# Patient Record
Sex: Female | Born: 1990 | Race: Black or African American | Hispanic: No | Marital: Single | Smoking: Never smoker
Health system: Southern US, Community
[De-identification: ages and names within clinical notes are randomized; demographics above are authoritative.]

## PROBLEM LIST (undated history)

## (undated) ENCOUNTER — Emergency Department (HOSPITAL_COMMUNITY)
Disposition: A | Discharge status: Home / Self Care | Payer: Medicaid Other | Attending: Emergency Medicine | Admitting: Emergency Medicine

## (undated) DIAGNOSIS — Z8613 Personal history of malaria: Secondary | ICD-10-CM

## (undated) DIAGNOSIS — Z8632 Personal history of gestational diabetes: Secondary | ICD-10-CM

## (undated) DIAGNOSIS — H7290 Unspecified perforation of tympanic membrane, unspecified ear: Secondary | ICD-10-CM

## (undated) DIAGNOSIS — D649 Anemia, unspecified: Secondary | ICD-10-CM

## (undated) DIAGNOSIS — Z8719 Personal history of other diseases of the digestive system: Secondary | ICD-10-CM

## (undated) DIAGNOSIS — O26899 Other specified pregnancy related conditions, unspecified trimester: Secondary | ICD-10-CM

## (undated) DIAGNOSIS — Z6791 Unspecified blood type, Rh negative: Secondary | ICD-10-CM

## (undated) HISTORY — PX: EXTERNAL EAR SURGERY: SHX627

---
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
## 2010-01-29 DIAGNOSIS — Z8613 Personal history of malaria: Secondary | ICD-10-CM

## 2010-01-29 HISTORY — DX: Personal history of malaria: Z86.13

## 2011-04-03 ENCOUNTER — Emergency Department (INDEPENDENT_AMBULATORY_CARE_PROVIDER_SITE_OTHER)
Admission: EM | Admit: 2011-04-03 | Discharge: 2011-04-03 | Disposition: A | Discharge status: Home / Self Care | Payer: Self-pay | Source: Home / Self Care | Attending: Family Medicine | Admitting: Family Medicine

## 2011-04-03 ENCOUNTER — Encounter (HOSPITAL_COMMUNITY): Payer: Self-pay | Admitting: Emergency Medicine

## 2011-04-03 DIAGNOSIS — R1011 Right upper quadrant pain: Secondary | ICD-10-CM

## 2011-04-03 DIAGNOSIS — G8929 Other chronic pain: Secondary | ICD-10-CM

## 2011-04-03 MED ORDER — RANITIDINE HCL 150 MG PO TABS: 150.0000 mg | ORAL_TABLET | Freq: Two times a day (BID) | ORAL | Status: DC

## 2011-04-03 MED ORDER — GI COCKTAIL ~~LOC~~
ORAL | Status: AC
  Filled 2011-04-03: qty 30

## 2011-04-03 MED ORDER — GI COCKTAIL ~~LOC~~
30.0000 mL | Freq: Once | ORAL | Status: AC
  Administered 2011-04-03: 30 mL via ORAL

## 2011-04-03 NOTE — ED Provider Notes (Signed)
History     CSN: [id number]  Arrival date & time 04/03/11  8413   First MD Initiated Contact with Patient 04/03/11 0932      Chief Complaint  Patient presents with  . Abdominal Pain    (Consider location/radiation/quality/duration/timing/severity/associated sxs/prior treatment) Patient is a 21 y.o. female presenting with abdominal pain. The history is provided by the patient.  Abdominal Pain The primary symptoms of the illness include abdominal pain. The primary symptoms of the illness do not include fever, nausea, vomiting, diarrhea or hematochezia. The current episode started more than 2 days ago (over 1 yr sx , is a refugee from [place], in [place] for 2 weeks, sx constant since, has seen md before and told it was gastritis and given meds which helped.). The onset of the illness was gradual. The problem has been gradually worsening.    History reviewed. No pertinent past medical history.  History reviewed. No pertinent past surgical history.  No family history on file.  History  Substance Use Topics  . Smoking status: Never Smoker   . Smokeless tobacco: Not on file  . Alcohol Use: No    OB History    Grav Para Term Preterm Abortions TAB SAB Ect Mult Living                  Review of Systems  Constitutional: Negative for fever.  HENT: Negative.   Respiratory: Negative.   Cardiovascular: Negative.   Gastrointestinal: Positive for abdominal pain. Negative for nausea, vomiting, diarrhea and hematochezia.  Genitourinary: Negative.     Allergies  Review of patient's allergies indicates no known allergies.  Home Medications   Current Outpatient Rx  Name Route Sig Dispense Refill  . RANITIDINE HCL 150 MG PO TABS Oral Take 1 tablet (150 mg total) by mouth 2 (two) times daily. 60 tablet 0    BP 100/67  Pulse 70  Temp(Src) 97.7 F (36.5 C) (Oral)  Resp 18  SpO2 100%  LMP 03/18/2011  Physical Exam  Nursing note and vitals reviewed. Constitutional: She appears  well-developed and well-nourished.  HENT:  Head: Normocephalic.  Eyes: Pupils are equal, round, and reactive to light.  Neck: Normal range of motion. Neck supple.  Abdominal: Soft. Bowel sounds are normal. There is tenderness in the epigastric area. There is no rigidity, no rebound, no guarding and no CVA tenderness.    ED Course  Procedures (including critical care time)  Labs Reviewed - No data to display No results found.   1. Chronic bilateral upper abdominal pain       MDM          Barkley Bruns, MD 04/03/11 1026

## 2011-04-03 NOTE — ED Notes (Signed)
C/o abdominal pain, hurts every day, pain for one year.  Has seen a physician before for these symptoms.  Vomiting when she has eaten a lot of food, no blood in vomitus.  [place]  Patient is from [place]. Pain may last 7-6 days, reports had medicine back home, but [place].  Denies stress.

## 2011-04-30 ENCOUNTER — Encounter (HOSPITAL_COMMUNITY): Payer: Self-pay | Admitting: *Deleted

## 2011-04-30 ENCOUNTER — Emergency Department (HOSPITAL_COMMUNITY)
Admission: EM | Admit: 2011-04-30 | Discharge: 2011-04-30 | Disposition: A | Discharge status: Home / Self Care | Payer: Medicaid Other | Attending: Emergency Medicine | Admitting: Emergency Medicine

## 2011-04-30 DIAGNOSIS — R42 Dizziness and giddiness: Secondary | ICD-10-CM | POA: Insufficient documentation

## 2011-04-30 DIAGNOSIS — Z79899 Other long term (current) drug therapy: Secondary | ICD-10-CM | POA: Insufficient documentation

## 2011-04-30 DIAGNOSIS — R55 Syncope and collapse: Secondary | ICD-10-CM | POA: Insufficient documentation

## 2011-04-30 DIAGNOSIS — R5381 Other malaise: Secondary | ICD-10-CM | POA: Insufficient documentation

## 2011-04-30 DIAGNOSIS — R5383 Other fatigue: Secondary | ICD-10-CM | POA: Insufficient documentation

## 2011-04-30 DIAGNOSIS — R531 Weakness: Secondary | ICD-10-CM

## 2011-04-30 LAB — POCT I-STAT, CHEM 8
Chloride: 108 mEq/L (ref 96–112)
HCT: 41 % (ref 36.0–46.0)
Hemoglobin: 13.9 g/dL (ref 12.0–15.0)
Potassium: 4 mEq/L (ref 3.5–5.1)
Sodium: 139 mEq/L (ref 135–145)

## 2011-04-30 LAB — PREGNANCY, URINE: Preg Test, Ur: NEGATIVE

## 2011-04-30 NOTE — ED Provider Notes (Signed)
History     CSN: [id number]  Arrival date & time 04/30/11  1449   First MD Initiated Contact with Patient 04/30/11 1809      Chief Complaint  Patient presents with  . Near Syncope    after blood being drawn     (Consider location/radiation/quality/duration/timing/severity/associated sxs/prior treatment) HPI Comments: Patient reports generalized weakness and fatigue for the past couple of weeks.  She feels that she may be pregnant.  Her PCP did a serum pregnancy test today, but patient states that she does not know the results.  Patient denies any nausea or vomiting.  Denies vaginal discharge, vaginal bleeding, or abdominal pain.  She reports that her LMP was 03/18/11, which was a normal period.  She reports that her menstrual cycle is typically around one month.  She also reports that she was feeling lightheaded today after having her blood drawn.  She reports that she has not had anything to eat since early this morning.  No syncope.  The history is provided by the patient. The history is limited by a language barrier. A language interpreter was used Furniture conservator/restorer).    History reviewed. No pertinent past medical history.  History reviewed. No pertinent past surgical history.  No family history on file.  History  Substance Use Topics  . Smoking status: Never Smoker   . Smokeless tobacco: Not on file  . Alcohol Use: No    OB History    Grav Para Term Preterm Abortions TAB SAB Ect Mult Living                  Review of Systems  Constitutional: Positive for appetite change. Negative for fever and chills.  HENT: Negative for neck pain and neck stiffness.   Gastrointestinal: Negative for nausea, vomiting, abdominal pain and diarrhea.  Genitourinary: Negative for dysuria, frequency, decreased urine volume, vaginal bleeding, vaginal discharge and pelvic pain.  Skin: Negative for rash.  Neurological: Positive for dizziness and weakness. Negative for tremors, syncope, numbness  and headaches.    Allergies  Review of patient's allergies indicates no known allergies.  Home Medications   Current Outpatient Rx  Name Route Sig Dispense Refill  . RANITIDINE HCL 150 MG PO TABS Oral Take 1 tablet (150 mg total) by mouth 2 (two) times daily. 60 tablet 0    BP 110/65  Pulse 65  Temp(Src) 98.6 F (37 C) (Oral)  Resp 16  SpO2 100%  LMP 03/18/2011  Physical Exam  Nursing note and vitals reviewed. Constitutional: She appears well-developed and well-nourished. No distress.  HENT:  Head: Normocephalic and atraumatic.  Mouth/Throat: Oropharynx is clear and moist.  Eyes: EOM are normal. Pupils are equal, round, and reactive to light.  Neck: Normal range of motion. Neck supple.  Cardiovascular: Normal rate, regular rhythm and normal heart sounds.   Pulmonary/Chest: Effort normal and breath sounds normal.  Abdominal: Soft. Bowel sounds are normal. She exhibits no distension and no mass. There is no rebound and no guarding.  Neurological: She is alert. She has normal strength and normal reflexes. No cranial nerve deficit or sensory deficit. She displays a negative Romberg sign. Coordination and gait normal.  Skin: Skin is warm and dry. No rash noted. She is not diaphoretic.  Psychiatric: She has a normal mood and affect.    ED Course  Procedures (including critical care time)   Labs Reviewed  PREGNANCY, URINE   No results found.   No diagnosis found.    MDM  Patient  with generalized weakness and fatigue.  Patient concerned that she may be pregnant.  Urine pregnancy negative.  Patient denies abdominal pain, vaginal bleeding, or vaginal discharge.  I stat 8 showed a normal Hgb and Hct and was completely normal.  Patient instructed to follow up with PCP.       Pascal Lux [place], PA-C 05/02/11 0101

## 2011-04-30 NOTE — ED Notes (Signed)
Pt. Also denies painful or frequent urination.

## 2011-04-30 NOTE — ED Notes (Signed)
Pt. C/o nausea and vomiting on and off for about a week.  Denies diarrhea but says she has felt aches for about a week also.  Pt.'s last menses was Feb. 17th.

## 2011-04-30 NOTE — ED Notes (Signed)
Pt states through [facility] interpreter for [place] "I was getting blood drawn to check for pregnancy and felt dizzy, haven't had a period in 2 months, was nauseated"

## 2011-05-01 ENCOUNTER — Ambulatory Visit (INDEPENDENT_AMBULATORY_CARE_PROVIDER_SITE_OTHER): Payer: Medicaid Other | Admitting: Family Medicine

## 2011-05-01 ENCOUNTER — Encounter: Payer: Self-pay | Admitting: Family Medicine

## 2011-05-01 DIAGNOSIS — Z3009 Encounter for other general counseling and advice on contraception: Secondary | ICD-10-CM

## 2011-05-01 DIAGNOSIS — H9190 Unspecified hearing loss, unspecified ear: Secondary | ICD-10-CM

## 2011-05-01 MED ORDER — PRENATAL VITAMINS (DIS) PO TABS: 1.0000 | ORAL_TABLET | Freq: Every day | ORAL | Status: DC

## 2011-05-01 NOTE — Progress Notes (Signed)
  Subjective:    Patient ID: Heather Savage, female    DOB: February 27, 1990, 21 y.o.   MRN: [id number]  HPI  New immigrant here to establish primary care  Hearing loss:  Since age 66 she associated this to a head trauma.  Brings foreign health records: chronic otitis media.  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  Were planning tympanostomy and hearing aids but was not available in [place]- recommended immediate surgical treatment in 09/2009.  Notes chronic tinnitus, bilateral ear drainage.    LMP 03/13/11 Review of Systems Patient Information Form: Screening and ROS  AUDIT-C Score: 0 Do you feel safe in relationships? yes PHQ-2:negative  Review of Symptoms  General:  Negative for nexplained weight loss, fever Skin: Negative for new or changing mole, sore that won't heal HEENT: POSITIVE for trouble hearing  ringing in ears, dizziness NEGATIVE for mouth sores, hoarseness, change in voice, dysphagia. CV:  Negative for chest pain, dyspnea, edema, palpitations Resp: Negative for cough, dyspnea, hemoptysis GI: Negative for nausea, vomiting, diarrhea, constipation, abdominal pain, melena, hematochezia. GU: Negative for dysuria, incontinence, urinary hesitance, hematuria, vaginal or penile discharge, polyuria, sexual difficulty, lumps in testicle or breasts MSK: Negative for muscle cramps or aches, joint pain or swelling Neuro: Negative for headaches, weakness, numbness, dizziness, passing out/fainting Psych: Negative for depression, anxiety, memory problems  Boyfriend serves as interpreter.  They decline need for 3rd party phone interpreter and have signed paperwork.    Objective:   Physical Exam GEN: Alert & Oriented, No acute distress HEENT: [place]/AT- well healed scar midline upper forehead. EOMI, PERRLA, no conjunctival injection or scleral icterus.  Bilateral tympanic membranes NOT intact. No pus or erythema.  Oropharynx is without erythema or exudates.  No anterior or  posterior cervical lymphadenopathy. CV:  Regular Rate & Rhythm, no murmur Respiratory:  Normal work of breathing, CTAB Abd:  + BS, soft, no tenderness to palpation Ext: no pre-tibial edema Psych:  Alert, oriented, normal affect, judgement good.       Assessment & Plan:

## 2011-05-01 NOTE — Patient Instructions (Signed)
Take prenatal vitamin every day  Make appointment in May for women's yearly checkup  I wlil make appointment for ear doctor.  If you don't hear from [place], give [place] a call to check on appointment

## 2011-05-01 NOTE — Assessment & Plan Note (Signed)
Would like to become pregnant soon.  I advised and prescribed prenatal vitamins.  Will follow-up next month for annual gynecological exam.

## 2011-05-02 NOTE — ED Provider Notes (Signed)
Medical screening examination/treatment/procedure(s) were performed by non-physician practitioner and as supervising physician I was immediately available for consultation/collaboration.  Hurman Horn, MD 05/02/11 2156

## 2011-05-10 ENCOUNTER — Encounter (HOSPITAL_BASED_OUTPATIENT_CLINIC_OR_DEPARTMENT_OTHER): Payer: Self-pay | Admitting: *Deleted

## 2011-05-10 ENCOUNTER — Encounter: Payer: Self-pay | Admitting: Family Medicine

## 2011-05-13 NOTE — H&P (Signed)
  Heather Savage is an 21 y.o. female.   Chief Complaint: Hearing loss HPI: African immigrant, history of bilateral hearing loss for many years.   Past Medical History  Diagnosis Date  . Hearing loss     bilat.  Marland Kitchen Headache     due to left ear problem  . GERD (gastroesophageal reflux disease)     occasional - Zantac as needed  . Arthritis   . 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    History reviewed. No pertinent past surgical history.  History reviewed. No pertinent family history. Social History:  reports that she has never smoked. She has never used smokeless tobacco. She reports that she does not drink alcohol or use illicit drugs.  Allergies: No Known Allergies  No current facility-administered medications on file as of .   Medications Prior to Admission  Medication Sig Dispense Refill  . aspirin-acetaminophen-caffeine (EXCEDRIN MIGRAINE) 250-250-65 MG per tablet Take 1 tablet by mouth every 6 (six) hours as needed.      . ranitidine (ZANTAC) 150 MG tablet Take 1 tablet (150 mg total) by mouth 2 (two) times daily.  60 tablet  0    No results found for this or any previous visit (from the past 48 hour(s)). No results found.  ROS: otherwise negative  Height 5' (1.524 m), weight 110 lb 3.7 oz (50 kg), last menstrual period 05/02/2011.  PHYSICAL EXAM: Overall appearance:  Healthy appearing, in no distress Head:  Normocephalic, atraumatic. Ears: External auditory canals are clear; tympanic membranes with bilateral near-total perforations.. Nose: External nose is healthy in appearance. Internal nasal exam free of any lesions or obstruction. Oral Cavity:  There are no mucosal lesions or masses identified. Oral Pharynx/Hypopharynx/Larynx: no signs of any mucosal lesions or masses identified.  Neuro:  No identifiable neurologic deficits. Neck: No palpable neck masses.  Studies Reviewed: Audiograms reveal  severe bilateral conductive hearing loss. Left ear is slightly worse.    Assessment/Plan Recommend left tympanoplasty.  Heather Savage 05/13/2011, 3:06 PM

## 2011-05-14 ENCOUNTER — Encounter (HOSPITAL_BASED_OUTPATIENT_CLINIC_OR_DEPARTMENT_OTHER): Payer: Self-pay | Admitting: Anesthesiology

## 2011-05-14 ENCOUNTER — Encounter (HOSPITAL_BASED_OUTPATIENT_CLINIC_OR_DEPARTMENT_OTHER): Payer: Self-pay | Admitting: *Deleted

## 2011-05-14 ENCOUNTER — Ambulatory Visit (HOSPITAL_BASED_OUTPATIENT_CLINIC_OR_DEPARTMENT_OTHER)
Admission: RE | Admit: 2011-05-14 | Discharge: 2011-05-14 | Disposition: A | Discharge status: Home / Self Care | Payer: Medicaid Other | Source: Ambulatory Visit | Attending: Otolaryngology | Admitting: Otolaryngology

## 2011-05-14 ENCOUNTER — Encounter (HOSPITAL_BASED_OUTPATIENT_CLINIC_OR_DEPARTMENT_OTHER)
Admission: RE | Disposition: A | Discharge status: Home / Self Care | Payer: Self-pay | Source: Ambulatory Visit | Attending: Otolaryngology

## 2011-05-14 ENCOUNTER — Ambulatory Visit (HOSPITAL_BASED_OUTPATIENT_CLINIC_OR_DEPARTMENT_OTHER): Payer: Medicaid Other | Admitting: Anesthesiology

## 2011-05-14 DIAGNOSIS — H9190 Unspecified hearing loss, unspecified ear: Secondary | ICD-10-CM | POA: Insufficient documentation

## 2011-05-14 DIAGNOSIS — H7290 Unspecified perforation of tympanic membrane, unspecified ear: Secondary | ICD-10-CM | POA: Insufficient documentation

## 2011-05-14 DIAGNOSIS — K219 Gastro-esophageal reflux disease without esophagitis: Secondary | ICD-10-CM | POA: Insufficient documentation

## 2011-05-14 DIAGNOSIS — R51 Headache: Secondary | ICD-10-CM | POA: Insufficient documentation

## 2011-05-14 DIAGNOSIS — M129 Arthropathy, unspecified: Secondary | ICD-10-CM | POA: Insufficient documentation

## 2011-05-14 HISTORY — DX: Unspecified perforation of tympanic membrane, unspecified ear: H72.90

## 2011-05-14 HISTORY — PX: TYMPANOPLASTY: SHX33

## 2011-05-14 LAB — POCT HEMOGLOBIN-HEMACUE: Hemoglobin: 11.4 g/dL — ABNORMAL LOW (ref 12.0–15.0)

## 2011-05-14 SURGERY — TYMPANOPLASTY
Anesthesia: General | Site: Ear | Laterality: Left | Wound class: Clean Contaminated

## 2011-05-14 MED ORDER — NEOSTIGMINE METHYLSULFATE 1 MG/ML IJ SOLN
INTRAMUSCULAR | Status: DC | PRN
  Administered 2011-05-14: 1.5 mg via INTRAVENOUS

## 2011-05-14 MED ORDER — MIDAZOLAM HCL 2 MG/2ML IJ SOLN: 0.5000 mg | INTRAMUSCULAR | Status: DC | PRN

## 2011-05-14 MED ORDER — METOCLOPRAMIDE HCL 5 MG/ML IJ SOLN: 10.0000 mg | Freq: Once | INTRAMUSCULAR | Status: DC | PRN

## 2011-05-14 MED ORDER — METOCLOPRAMIDE HCL 5 MG/ML IJ SOLN
INTRAMUSCULAR | Status: DC | PRN
  Administered 2011-05-14: 10 mg via INTRAVENOUS

## 2011-05-14 MED ORDER — ROCURONIUM BROMIDE 100 MG/10ML IV SOLN
INTRAVENOUS | Status: DC | PRN
  Administered 2011-05-14: 30 mg via INTRAVENOUS

## 2011-05-14 MED ORDER — PROPOFOL 10 MG/ML IV EMUL
INTRAVENOUS | Status: DC | PRN
  Administered 2011-05-14: 200 mg via INTRAVENOUS

## 2011-05-14 MED ORDER — LIDOCAINE-EPINEPHRINE 1 %-1:100000 IJ SOLN
INTRAMUSCULAR | Status: DC | PRN
  Administered 2011-05-14: 5 mL

## 2011-05-14 MED ORDER — METHYLENE BLUE 1 % INJ SOLN
INTRAMUSCULAR | Status: DC | PRN
  Administered 2011-05-14: .001 mL via SUBMUCOSAL

## 2011-05-14 MED ORDER — EPINEPHRINE HCL 1 MG/ML IJ SOLN
INTRAMUSCULAR | Status: DC | PRN
  Administered 2011-05-14: .001 mL

## 2011-05-14 MED ORDER — PROMETHAZINE HCL 25 MG RE SUPP: 25.0000 mg | Freq: Four times a day (QID) | RECTAL | Status: DC | PRN

## 2011-05-14 MED ORDER — LACTATED RINGERS IV SOLN
INTRAVENOUS | Status: DC
  Administered 2011-05-14 (×3): via INTRAVENOUS

## 2011-05-14 MED ORDER — HYDROMORPHONE HCL PF 1 MG/ML IJ SOLN: 0.2500 mg | INTRAMUSCULAR | Status: DC | PRN

## 2011-05-14 MED ORDER — KETOROLAC TROMETHAMINE 30 MG/ML IJ SOLN
INTRAMUSCULAR | Status: DC | PRN
  Administered 2011-05-14: 30 mg via INTRAVENOUS

## 2011-05-14 MED ORDER — BACITRACIN ZINC 500 UNIT/GM EX OINT
TOPICAL_OINTMENT | CUTANEOUS | Status: DC | PRN
  Administered 2011-05-14: 1 via TOPICAL

## 2011-05-14 MED ORDER — CIPROFLOXACIN-DEXAMETHASONE 0.3-0.1 % OT SUSP
OTIC | Status: DC | PRN
  Administered 2011-05-14: 8 [drp] via OTIC

## 2011-05-14 MED ORDER — ACETAMINOPHEN 10 MG/ML IV SOLN
1000.0000 mg | Freq: Once | INTRAVENOUS | Status: AC
  Administered 2011-05-14: 1000 mg via INTRAVENOUS

## 2011-05-14 MED ORDER — DEXAMETHASONE SODIUM PHOSPHATE 4 MG/ML IJ SOLN
INTRAMUSCULAR | Status: DC | PRN
  Administered 2011-05-14: 10 mg via INTRAVENOUS

## 2011-05-14 MED ORDER — CIPROFLOXACIN-DEXAMETHASONE 0.3-0.1 % OT SUSP: 3.0000 [drp] | Freq: Three times a day (TID) | OTIC | Status: AC

## 2011-05-14 MED ORDER — GLYCOPYRROLATE 0.2 MG/ML IJ SOLN
INTRAMUSCULAR | Status: DC | PRN
  Administered 2011-05-14: 0.3 mg via INTRAVENOUS

## 2011-05-14 MED ORDER — ONDANSETRON HCL 4 MG/2ML IJ SOLN
INTRAMUSCULAR | Status: DC | PRN
  Administered 2011-05-14: 4 mg via INTRAVENOUS

## 2011-05-14 MED ORDER — MIDAZOLAM HCL 5 MG/5ML IJ SOLN
INTRAMUSCULAR | Status: DC | PRN
  Administered 2011-05-14: 2 mg via INTRAVENOUS

## 2011-05-14 MED ORDER — HYDROCODONE-ACETAMINOPHEN 7.5-500 MG PO TABS: 1.0000 | ORAL_TABLET | Freq: Four times a day (QID) | ORAL | Status: AC | PRN

## 2011-05-14 MED ORDER — DROPERIDOL 2.5 MG/ML IJ SOLN
INTRAMUSCULAR | Status: DC | PRN
  Administered 2011-05-14: 0.625 mg via INTRAVENOUS

## 2011-05-14 MED ORDER — FENTANYL CITRATE 0.05 MG/ML IJ SOLN
INTRAMUSCULAR | Status: DC | PRN
  Administered 2011-05-14: 25 ug via INTRAVENOUS
  Administered 2011-05-14: 50 ug via INTRAVENOUS
  Administered 2011-05-14: 25 ug via INTRAVENOUS

## 2011-05-14 SURGICAL SUPPLY — 77 items
BANDAGE GAUZE 4  KLING STR (GAUZE/BANDAGES/DRESSINGS) IMPLANT
BANDAGE GAUZE ELAST BULKY 4 IN (GAUZE/BANDAGES/DRESSINGS) IMPLANT
BENZOIN TINCTURE PRP APPL 2/3 (GAUZE/BANDAGES/DRESSINGS) IMPLANT
BIT DRILL LEGEND 0.5MM 70MM (BIT) IMPLANT
BIT DRILL LEGEND 1.0MM 70MM (BIT) IMPLANT
BIT DRILL LEGEND 4.0MM 70MM (BIT) IMPLANT
BLADE NEEDLE 3 SS STRL (BLADE) IMPLANT
BLADE SURG ROTATE 9660 (MISCELLANEOUS) IMPLANT
CANISTER SUCTION 1200CC (MISCELLANEOUS) ×2 IMPLANT
CLEANER CAUTERY TIP 5X5 PAD (MISCELLANEOUS) ×1 IMPLANT
CLOTH BEACON ORANGE TIMEOUT ST (SAFETY) ×2 IMPLANT
COTTONBALL LRG STERILE PKG (GAUZE/BANDAGES/DRESSINGS) ×2 IMPLANT
DECANTER SPIKE VIAL GLASS SM (MISCELLANEOUS) ×2 IMPLANT
DERMABOND ADVANCED (GAUZE/BANDAGES/DRESSINGS) ×1
DERMABOND ADVANCED .7 DNX12 (GAUZE/BANDAGES/DRESSINGS) ×1 IMPLANT
DRAPE EENT ADH APERT 31X51 STR (DRAPES) ×2 IMPLANT
DRAPE INCISE 23X17 IOBAN STRL (DRAPES)
DRAPE INCISE IOBAN 23X17 STRL (DRAPES) IMPLANT
DRAPE MICROSCOPE URBAN (DRAPES) ×2 IMPLANT
DRAPE MICROSCOPE WILD 40.5X102 (DRAPES) IMPLANT
DRESSING TELFA 8X3 (GAUZE/BANDAGES/DRESSINGS) IMPLANT
DRILL BIT LEGEND (BIT) IMPLANT
DRILL BIT LEGEND 7BA20-MN (BIT) IMPLANT
DRILL BIT LEGEND 7BA25-MN (BIT) IMPLANT
DRILL BIT LEGEND 7BA30-MN (BIT) IMPLANT
DRILL BIT LEGEND 7BA30D-MN (BIT) IMPLANT
DRILL BIT LEGEND 7BA30DL-MN (BIT) IMPLANT
DRILL BIT LEGEND 7BA30L-MN (BIT) IMPLANT
DRILL BIT LEGEND 7BA40-MN (BIT) IMPLANT
DRILL BIT LEGEND 7BA40D-MN (BIT) IMPLANT
DRILL BIT LEGEND 7BA50-MN (BIT) IMPLANT
DRILL BIT LEGEND 7BA50D-MN (BIT) IMPLANT
DRILL BIT LEGEND 7BA60-MN (BIT) IMPLANT
DRILL BIT LEGEND 7BA70-MN (BIT) IMPLANT
DROPPER MEDICINE STER 1.5ML LF (MISCELLANEOUS) IMPLANT
DRSG GLASSCOCK MASTOID ADT (GAUZE/BANDAGES/DRESSINGS) ×2 IMPLANT
DRSG GLASSCOCK MASTOID PED (GAUZE/BANDAGES/DRESSINGS) IMPLANT
ELECT COATED BLADE 2.86 ST (ELECTRODE) ×2 IMPLANT
ELECT REM PT RETURN 9FT ADLT (ELECTROSURGICAL) ×2
ELECTRODE REM PT RTRN 9FT ADLT (ELECTROSURGICAL) ×1 IMPLANT
GAUZE SPONGE 4X4 12PLY STRL LF (GAUZE/BANDAGES/DRESSINGS) IMPLANT
GAUZE SPONGE 4X4 16PLY XRAY LF (GAUZE/BANDAGES/DRESSINGS) IMPLANT
GLOVE ECLIPSE 7.5 STRL STRAW (GLOVE) ×2 IMPLANT
GLOVE SKINSENSE NS SZ7.0 (GLOVE) ×2
GLOVE SKINSENSE STRL SZ7.0 (GLOVE) ×2 IMPLANT
GOWN PREVENTION PLUS XLARGE (GOWN DISPOSABLE) ×4 IMPLANT
GOWN PREVENTION PLUS XXLARGE (GOWN DISPOSABLE) ×2 IMPLANT
IV CATH AUTO 14GX1.75 SAFE ORG (IV SOLUTION) IMPLANT
NDL SAFETY ECLIPSE 18X1.5 (NEEDLE) ×1 IMPLANT
NEEDLE 27GAX1X1/2 (NEEDLE) ×2 IMPLANT
NEEDLE HYPO 18GX1.5 SHARP (NEEDLE) ×1
NS IRRIG 1000ML POUR BTL (IV SOLUTION) ×2 IMPLANT
PACK BASIN DAY SURGERY FS (CUSTOM PROCEDURE TRAY) ×2 IMPLANT
PACK ENT DAY SURGERY (CUSTOM PROCEDURE TRAY) ×2 IMPLANT
PAD CLEANER CAUTERY TIP 5X5 (MISCELLANEOUS) ×1
PENCIL FOOT CONTROL (ELECTRODE) ×2 IMPLANT
PORP 5.5MM GOLDENBERG LONG (Otologic Implant) ×2 IMPLANT
PORP GOLDENBERG LONG 5.5MM (Otologic Implant) ×1 IMPLANT
SET EXT MALE ROTATING LL 32IN (MISCELLANEOUS) ×2 IMPLANT
SHEET MEDIUM DRAPE 40X70 STRL (DRAPES) IMPLANT
SLEEVE SCD COMPRESS KNEE MED (MISCELLANEOUS) IMPLANT
SPONGE GAUZE 4X4 12PLY (GAUZE/BANDAGES/DRESSINGS) IMPLANT
SPONGE SURGIFOAM ABS GEL 12-7 (HEMOSTASIS) IMPLANT
STRIP CLOSURE SKIN 1/2X4 (GAUZE/BANDAGES/DRESSINGS) IMPLANT
SUT CHROMIC 3 0 PS 2 (SUTURE) IMPLANT
SUT CHROMIC 4 0 P 3 18 (SUTURE) ×2 IMPLANT
SUT CHROMIC 4 0 PS 2 18 (SUTURE) ×2 IMPLANT
SUT ETHILON 5 0 P 3 18 (SUTURE)
SUT NYLON ETHILON 5-0 P-3 1X18 (SUTURE) IMPLANT
SUT PLAIN 5 0 P 3 18 (SUTURE) IMPLANT
SUT VIC AB 3-0 FS2 27 (SUTURE) IMPLANT
SYR 5ML LL (SYRINGE) IMPLANT
SYR BULB 3OZ (MISCELLANEOUS) IMPLANT
TOWEL OR 17X24 6PK STRL BLUE (TOWEL DISPOSABLE) ×2 IMPLANT
TRAY DSU PREP LF (CUSTOM PROCEDURE TRAY) ×2 IMPLANT
TUBING IRRIGATION STER IRD100 (TUBING) IMPLANT
WATER STERILE IRR 1000ML POUR (IV SOLUTION) ×2 IMPLANT

## 2011-05-14 NOTE — Anesthesia Procedure Notes (Signed)
Procedure Name: Intubation Date/Time: 05/14/2011 9:53 AM Performed by: Gar Gibbon Pre-anesthesia Checklist: Patient identified, Emergency Drugs available, Suction available and Patient being monitored Patient Re-evaluated:Patient Re-evaluated prior to inductionOxygen Delivery Method: Circle System Utilized Preoxygenation: Pre-oxygenation with 100% oxygen Intubation Type: IV induction Ventilation: Mask ventilation without difficulty Laryngoscope Size: Mac and 3 Grade View: Grade II Tube type: Oral Rae Number of attempts: 1 Airway Equipment and Method: stylet and oral airway Placement Confirmation: ETT inserted through vocal cords under direct vision,  positive ETCO2 and breath sounds checked- equal and bilateral Secured at: 19 cm Tube secured with: Tape Dental Injury: Teeth and Oropharynx as per pre-operative assessment

## 2011-05-14 NOTE — Transfer of Care (Signed)
Immediate Anesthesia Transfer of Care Note  Patient: Heather Savage  Procedure(s) Performed: Procedure(s) (LRB): TYMPANOPLASTY (Left)  Patient Location: PACU  Anesthesia Type: General  Level of Consciousness: sedated  Airway & Oxygen Therapy: Patient Spontanous Breathing and Patient connected to face mask oxygen  Post-op Assessment: Report given to PACU RN and Post -op Vital signs reviewed and stable  Post vital signs: Reviewed and stable  Complications: No apparent anesthesia complications

## 2011-05-14 NOTE — Anesthesia Preprocedure Evaluation (Signed)
Anesthesia Evaluation  Patient identified by MRN, date of birth, ID band Patient awake    Reviewed: Allergy & Precautions, H&P , NPO status , Patient's Chart, lab work & pertinent test results, reviewed documented beta blocker date and time   Airway Mallampati: II TM Distance: >3 FB Neck ROM: full    Dental   Pulmonary neg pulmonary ROS,          Cardiovascular negative cardio ROS      Neuro/Psych  Headaches, negative psych ROS   GI/Hepatic Neg liver ROS, Medicated and Controlled,  Endo/Other  negative endocrine ROS  Renal/GU negative Renal ROS  negative genitourinary   Musculoskeletal   Abdominal   Peds  Hematology negative hematology ROS (+)   Anesthesia Other Findings See surgeon's H&P   Reproductive/Obstetrics negative OB ROS                           Anesthesia Physical Anesthesia Plan  ASA: II  Anesthesia Plan: General   Post-op Pain Management:    Induction: Intravenous  Airway Management Planned: Oral ETT  Additional Equipment:   Intra-op Plan:   Post-operative Plan: Extubation in OR  Informed Consent: I have reviewed the patients History and Physical, chart, labs and discussed the procedure including the risks, benefits and alternatives for the proposed anesthesia with the patient or authorized representative who has indicated his/her understanding and acceptance.     Plan Discussed with: CRNA and Surgeon  Anesthesia Plan Comments:         Anesthesia Quick Evaluation

## 2011-05-14 NOTE — Interval H&P Note (Signed)
History and Physical Interval Note:  05/14/2011 9:09 AM  Heather Savage  has presented today for surgery, with the diagnosis of TYMPANIC MEMBRANE PERFORATION   The various methods of treatment have been discussed with the patient and family. After consideration of risks, benefits and other options for treatment, the patient has consented to  Procedure(s) (LRB): TYMPANOPLASTY (Left) as a surgical intervention .  The patients' history has been reviewed, patient examined, no change in status, stable for surgery.  I have reviewed the patients' chart and labs.  Questions were answered to the patient's satisfaction.     [name]

## 2011-05-14 NOTE — Anesthesia Postprocedure Evaluation (Signed)
Anesthesia Post Note  Patient: Heather Savage  Procedure(s) Performed: Procedure(s) (LRB): TYMPANOPLASTY (Left)  Anesthesia type: General  Patient location: PACU  Post pain: Pain level controlled  Post assessment: Patient's Cardiovascular Status Stable  Last Vitals:  Filed Vitals:   05/14/11 1215  BP: 103/68  Pulse: 56  Temp:   Resp: 10    Post vital signs: Reviewed and stable  Level of consciousness: alert  Complications: No apparent anesthesia complications

## 2011-05-14 NOTE — Op Note (Signed)
OPERATIVE REPORT  DATE OF SURGERY: 05/14/2011  PATIENT:  Heather Savage,  21 y.o. female  PRE-OPERATIVE DIAGNOSIS:  TYMPANIC MEMBRANE PERFORATION with hearing loss  POST-OPERATIVE DIAGNOSIS:  TYMPANIC MEMBRANE PERFORATION with hearing loss  PROCEDURE:  Procedure(s): TYMPANOPLASTY  SURGEON:  Susy Frizzle, MD  ASSISTANTS: none   ANESTHESIA:   general  EBL:  5 ml  DRAINS: none   LOCAL MEDICATIONS USED:  XYLOCAINE   SPECIMEN:  Source of Specimen:  Left middle ear tissue  COUNTS:  YES  PROCEDURE DETAILS: The patient was taken to the operating room and placed on the operating table in the supine position. Following induction of general endotracheal anesthesia, the left ear was prepped and draped in a standard fashion. Using the operating microscope, the ear canal and the tympanic membrane were inspected. There was a near total perforation of the tympanic membrane. The ear canal was injected with Xylocaine/epinephrine solution in 4 quadrants. A vascular strip was created with radial incisions at 4:00 and 8:00, connected posteriorly with a round knife. The postauricular sulcus was infiltrated with Xylocaine/epinephrine solution. Electrocautery was used to create the postauricular incision area temporalis fascia was harvested, pressed and dried on the back table. The linea temporalis was incised, with a T. limb down to the mastoid periosteum. The periosteum, vascular strip, and auricle were brought forward and sutured in place using a self-retaining retractor. The tympanic membrane was then reinspected using the microscope. A sharp otologic pick was used to remove the edges of the perforation anteriorly. The ossicular chain was inspected as the posterior tympanomeatal flap was brought forward. The chorda tympani nerve was preserved. The entire lateral process of the malleus was gone. The entire incus was gone as well. The stapes superstructure was intact. The stapes was mobile I. palpation.  There is some granulation tissue around the stapes superstructure which was carefully removed with an angled cup forcep and was sent for pathologic evaluation. The middle ear was otherwise clear. An annulus elevator was used to elevate the anterior ear canal flap. The middle ear was packed with saline soaked Gelfoam. A [employer] PORP was cut down to size and placed on the  stapes superstructure. Positioning was stabilized. The graft was cut to size and shape and placed on top of the Gelfoam in a medial onlay technique. The drum remnant was then placed back along the edges of the graft were ever possible. The ear canal was packed with Ciprodex-soaked Gelfoam. The vascular strip was returned to its normal position. The ear was brought back to its normal position as well. The mastoid periosteum was reapproximated with 3-0 chromic suture. A subcuticular closure was performed on the postauricular incision, also using 3-0 chromic. Dermabond was used on the skin. A cotton ball with bacitracin was placed in the external meatus and a Glasscock mastoid dressing was applied. The patient was then awakened, extubated and transferred to recovery in stable condition.  PATIENT DISPOSITION:  PACU - hemodynamically stable.

## 2011-05-14 NOTE — Discharge Instructions (Addendum)
Keep the dressing in place. Do not use the ear drops until after the visit tomorrow.  Please avoid blowing the nose, sneezing, straining.   Post Anesthesia Home Care Instructions  Activity: Get plenty of rest for the remainder of the day. A responsible adult should stay with you for 24 hours following the procedure.  For the next 24 hours, DO NOT: -Drive a car -Advertising copywriter -Drink alcoholic beverages -Take any medication unless instructed by your physician -Make any legal decisions or sign important papers.  Meals: Start with liquid foods such as gelatin or soup. Progress to regular foods as tolerated. Avoid greasy, spicy, heavy foods. If nausea and/or vomiting occur, drink only clear liquids until the nausea and/or vomiting subsides. Call your physician if vomiting continues.  Special Instructions/Symptoms: Your throat may feel dry or sore from the anesthesia or the breathing tube placed in your throat during surgery. If this causes discomfort, gargle with warm salt water. The discomfort should disappear within 24 hours.

## 2011-05-23 ENCOUNTER — Encounter (HOSPITAL_BASED_OUTPATIENT_CLINIC_OR_DEPARTMENT_OTHER): Payer: Self-pay | Admitting: Otolaryngology

## 2011-07-13 ENCOUNTER — Emergency Department (HOSPITAL_COMMUNITY)
Admission: EM | Admit: 2011-07-13 | Discharge: 2011-07-13 | Disposition: A | Discharge status: Home / Self Care | Payer: Medicaid Other | Source: Home / Self Care | Attending: Emergency Medicine | Admitting: Emergency Medicine

## 2011-07-13 ENCOUNTER — Encounter (HOSPITAL_COMMUNITY): Payer: Self-pay | Admitting: *Deleted

## 2011-07-13 DIAGNOSIS — N730 Acute parametritis and pelvic cellulitis: Secondary | ICD-10-CM

## 2011-07-13 LAB — POCT URINALYSIS DIP (DEVICE)
Bilirubin Urine: NEGATIVE
Glucose, UA: NEGATIVE mg/dL
Ketones, ur: NEGATIVE mg/dL
Protein, ur: NEGATIVE mg/dL
Specific Gravity, Urine: <=1.005 (ref 1.005–1.030)

## 2011-07-13 LAB — POCT PREGNANCY, URINE: Preg Test, Ur: NEGATIVE

## 2011-07-13 LAB — WET PREP, GENITAL
Trich, Wet Prep: NONE SEEN
Yeast Wet Prep HPF POC: NONE SEEN

## 2011-07-13 MED ORDER — CEFTRIAXONE SODIUM 250 MG IJ SOLR
250.0000 mg | Freq: Once | INTRAMUSCULAR | Status: AC
  Administered 2011-07-13: 250 mg via INTRAMUSCULAR

## 2011-07-13 MED ORDER — AZITHROMYCIN 250 MG PO TABS
ORAL_TABLET | ORAL | Status: AC
  Filled 2011-07-13: qty 4

## 2011-07-13 MED ORDER — CEFTRIAXONE SODIUM 250 MG IJ SOLR
INTRAMUSCULAR | Status: AC
  Filled 2011-07-13: qty 250

## 2011-07-13 MED ORDER — IBUPROFEN 600 MG PO TABS: 600.0000 mg | ORAL_TABLET | Freq: Four times a day (QID) | ORAL | Status: DC | PRN

## 2011-07-13 MED ORDER — AZITHROMYCIN 250 MG PO TABS
1000.0000 mg | ORAL_TABLET | Freq: Every day | ORAL | Status: DC
  Administered 2011-07-13: 1000 mg via ORAL

## 2011-07-13 NOTE — ED Notes (Signed)
pT  REPORTS   HEADACHE     FOR  ABOUT  6  DAYS  WITH  PHOTOPHOBIA           -  DENYS  ANY  VOMITING     SHE  DENYS  ANY INJURY     SHE  REPORTS    TAKING  PAIN PILLS  FROM A  PREVOUS  SURGERY        pEARLA   SHE  IS  ALERT  AND  ORIENTED  SITTING  UPRIGHT ON  EXAM TABLE  SPEAKING IN  COMPLETE  SENTANCES

## 2011-07-13 NOTE — Discharge Instructions (Signed)
Please see Dr. Pollyann Kennedy for an appointment about your ears.   Please see Dr. Earnest Bailey for an appointment about your hand and leg pain.

## 2011-07-13 NOTE — ED Provider Notes (Signed)
History     CSN: [id number]  Arrival date & time 07/13/11  1422   First MD Initiated Contact with Patient 07/13/11 1724      Chief Complaint  Patient presents with  . Headache    (Consider location/radiation/quality/duration/timing/severity/associated sxs/prior treatment) HPI Comments: Pt with multiple complaints.  Describes "fever" in her hands and legs for last month, also has happened in the past.  C/o headache, c/o suprapubic abd pain worse with urination, c/o B flank pain. Has been taking hydrocodone for headache without relief.   Patient is a 21 y.o. female presenting with headaches. The history is provided by the patient and a friend. A language interpreter was used (friend).  Headache The primary symptoms include headaches and nausea. Primary symptoms do not include loss of consciousness, dizziness, visual change, fever or vomiting. The symptoms began 5 to 7 days ago. The symptoms are unchanged.  The headache began more than 2 days ago. The headache developed gradually. Headache is a new problem. The headache is present continuously. Location/region(s) of the headache: temporal and bilateral. The headache is associated with photophobia. The headache is not associated with eye pain, visual change or neck stiffness.  Additional symptoms include photophobia. Additional symptoms do not include neck stiffness.    Past Medical History  Diagnosis Date  . Hearing loss     bilat.  Marland Kitchen Headache     due to left ear problem  . GERD (gastroesophageal reflux disease)     occasional - Zantac as needed  . Arthritis   . 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    Past Surgical History  Procedure Date  . Tympanoplasty 05/14/2011    Procedure: TYMPANOPLASTY;  Surgeon: Serena Colonel, MD;  Location: [facility];  Service: ENT;  Laterality: Left;  TYMPANOPLASYT AND  POSSIBLE OSSICULARPLASTY     History reviewed. No  pertinent family history.  History  Substance Use Topics  . Smoking status: Never Smoker   . Smokeless tobacco: Never Used  . Alcohol Use: No    OB History    Grav Para Term Preterm Abortions TAB SAB Ect Mult Living                  Review of Systems  Constitutional: Negative for fever and chills.  HENT: Negative for congestion, neck stiffness and sinus pressure.   Eyes: Positive for photophobia. Negative for pain.  Respiratory: Negative for cough.   Gastrointestinal: Positive for nausea and abdominal pain. Negative for vomiting.  Genitourinary: Positive for dysuria and flank pain. Negative for vaginal bleeding and vaginal discharge.  Musculoskeletal:       BLE and B hand "fever"   Skin: Negative for rash.  Neurological: Positive for headaches. Negative for dizziness, loss of consciousness and numbness.    Allergies  Review of patient's allergies indicates no known allergies.  Home Medications   Current Outpatient Rx  Name Route Sig Dispense Refill  . HYDROCODONE-ACETAMINOPHEN 7.5-325 MG PO TABS Oral Take 1 tablet by mouth every 6 (six) hours as needed.    . ASPIRIN-ACETAMINOPHEN-CAFFEINE 250-250-65 MG PO TABS Oral Take 1 tablet by mouth every 6 (six) hours as needed.    . IBUPROFEN 600 MG PO TABS Oral Take 1 tablet (600 mg total) by mouth every 6 (six) hours as needed for pain. 30 tablet 0  . PROMETHAZINE HCL 25 MG RE SUPP Rectal Place 1 suppository (25 mg total) rectally  every 6 (six) hours as needed for nausea. 12 each 0  . RANITIDINE HCL 150 MG PO TABS Oral Take 1 tablet (150 mg total) by mouth 2 (two) times daily. 60 tablet 0    BP 93/49  Pulse 75  Temp 97.7 F (36.5 C) (Oral)  Resp 14  SpO2 100%  LMP 07/03/2011  Physical Exam  Constitutional: She is oriented to person, place, and time. She appears well-developed and well-nourished. No distress.  HENT:  Head: Normocephalic and atraumatic.  Right Ear: External ear and ear canal normal. Tympanic membrane is  perforated. Decreased hearing is noted.  Left Ear: There is drainage. Tympanic membrane is perforated. Decreased hearing is noted.  Nose: Nose normal. Right sinus exhibits no maxillary sinus tenderness and no frontal sinus tenderness. Left sinus exhibits no maxillary sinus tenderness and no frontal sinus tenderness.  Mouth/Throat: Oropharynx is clear and moist.       White drainage in L ear canal.   Neck: Normal range of motion. Neck supple.  Cardiovascular: Normal rate and regular rhythm.   Pulmonary/Chest: Effort normal and breath sounds normal.  Abdominal: Soft. Bowel sounds are normal. There is tenderness in the suprapubic area. There is CVA tenderness. There is no rigidity, no rebound and no guarding.  Genitourinary: Vagina normal. There is no rash or tenderness on the right labia. There is no rash or tenderness on the left labia. Uterus is tender. Cervix exhibits motion tenderness. Right adnexum displays tenderness. Right adnexum displays no mass. Left adnexum displays tenderness. Left adnexum displays no mass.  Musculoskeletal:       B hands appear normal, FROM. BLE appear normal, gait normal, ROM normal  Neurological: She is alert and oriented to person, place, and time. Gait normal.  Skin: Skin is warm and dry. No rash noted.    ED Course  Procedures (including critical care time)  Labs Reviewed  POCT URINALYSIS DIP (DEVICE) - Abnormal; Notable for the following:    Hgb urine dipstick SMALL (*)     All other components within normal limits  WET PREP, GENITAL - Abnormal; Notable for the following:    WBC, Wet Prep HPF POC MODERATE (*)     All other components within normal limits  POCT PREGNANCY, URINE  GC/CHLAMYDIA PROBE AMP, GENITAL   No results found.   1. PID (acute pelvic inflammatory disease)       MDM  Pt reports long standing ear problems, decreased hearing, reports ear surgery by Dr. Pollyann Kennedy a month ago. Given pt's suprapubic pain, pelvic exam performed.  Pt with  cervical motion tenderness, uterine tenderness and B adnexal tenderness on exam. Discussed with Dr. Chaney Malling.  Empirically tx with rocephin and zithromax.  No concerning findings in BLE or hands, encouraged pt to f/u with pcp.  Encouraged pt to f/u with Dr. Pollyann Kennedy for L ear discharge; uncertain if this is expected drainage post-op or if acute process.  Headache possibly migraine, pt taking hydrocodone without relief.  Will try ibuprofen.  Pt to f/u with pcp if headache persists.         Cathlyn Parsons, NP 07/13/11 2128

## 2011-07-14 LAB — GC/CHLAMYDIA PROBE AMP, GENITAL
Chlamydia, DNA Probe: NEGATIVE
GC Probe Amp, Genital: NEGATIVE

## 2011-07-16 ENCOUNTER — Emergency Department (HOSPITAL_COMMUNITY): Payer: Medicaid Other

## 2011-07-16 ENCOUNTER — Emergency Department (HOSPITAL_COMMUNITY)
Admission: EM | Admit: 2011-07-16 | Discharge: 2011-07-16 | Disposition: A | Discharge status: Home / Self Care | Payer: Medicaid Other | Attending: Emergency Medicine | Admitting: Emergency Medicine

## 2011-07-16 DIAGNOSIS — R109 Unspecified abdominal pain: Secondary | ICD-10-CM | POA: Insufficient documentation

## 2011-07-16 DIAGNOSIS — M2550 Pain in unspecified joint: Secondary | ICD-10-CM | POA: Insufficient documentation

## 2011-07-16 DIAGNOSIS — R51 Headache: Secondary | ICD-10-CM | POA: Insufficient documentation

## 2011-07-16 LAB — URINALYSIS, ROUTINE W REFLEX MICROSCOPIC
Bilirubin Urine: NEGATIVE
Glucose, UA: NEGATIVE mg/dL
Ketones, ur: NEGATIVE mg/dL
Protein, ur: NEGATIVE mg/dL

## 2011-07-16 LAB — CBC
Hemoglobin: 12.4 g/dL (ref 12.0–15.0)
MCH: 31.6 pg (ref 26.0–34.0)
MCHC: 36 g/dL (ref 30.0–36.0)
MCV: 87.5 fL (ref 78.0–100.0)
Platelets: 207 10*3/uL (ref 150–400)

## 2011-07-16 LAB — DIFFERENTIAL
Basophils Relative: 1 % (ref 0–1)
Eosinophils Absolute: 0.2 10*3/uL (ref 0.0–0.7)
Eosinophils Relative: 5 % (ref 0–5)
Lymphs Abs: 1.7 10*3/uL (ref 0.7–4.0)
Monocytes Relative: 6 % (ref 3–12)
Neutrophils Relative %: 49 % (ref 43–77)

## 2011-07-16 LAB — COMPREHENSIVE METABOLIC PANEL
ALT: 17 U/L (ref 0–35)
AST: 26 U/L (ref 0–37)
Albumin: 3.8 g/dL (ref 3.5–5.2)
CO2: 22 mEq/L (ref 19–32)
Chloride: 103 mEq/L (ref 96–112)
GFR calc non Af Amer: >90 mL/min (ref >90)
Sodium: 137 mEq/L (ref 135–145)
Total Bilirubin: 0.3 mg/dL (ref 0.3–1.2)

## 2011-07-16 LAB — URINE MICROSCOPIC-ADD ON

## 2011-07-16 MED ORDER — HYDROCODONE-ACETAMINOPHEN 5-325 MG PO TABS
1.0000 | ORAL_TABLET | Freq: Four times a day (QID) | ORAL | Status: AC | PRN
Start: 2011-07-16 — End: 2011-07-26

## 2011-07-16 MED ORDER — HYDROCODONE-ACETAMINOPHEN 5-325 MG PO TABS
2.0000 | ORAL_TABLET | Freq: Once | ORAL | Status: AC
  Administered 2011-07-16: 2 via ORAL
  Filled 2011-07-16: qty 2

## 2011-07-16 NOTE — ED Notes (Signed)
Patient transported from CT 

## 2011-07-16 NOTE — ED Notes (Signed)
Report received from Cindy, RN.  Assumed care of this patient at this time.  

## 2011-07-16 NOTE — ED Notes (Addendum)
Patient continues to report headache pain.  Pt. Reports feeling of the room spinning and like she's going to faint.  Pt. Family up to this nurse 2 minutes after assessment reporting that the patient is unresponsive.  Patient was able to be aroused with ammonia stick.  Patient with stable vitals and alert and oreinted x4.  MD, Ignacia Palma, to bedside to assess patient during this event.  This RN will continue to monitor patient.

## 2011-07-16 NOTE — ED Notes (Signed)
Patient transported to CT 

## 2011-07-16 NOTE — Discharge Instructions (Signed)
Take Tylenol for mild pain or the pain medicine prescribed for bad pain. Call [facility] at [phone] tomorrow to get a primary care doctor or go to the doctor of your choice

## 2011-07-16 NOTE — ED Notes (Signed)
Patient alert and oriented x4.  Patient denies room spinning or feeling faint at this time.  Patient reports improvement in pain. MD, at bedside for re-assessment.

## 2011-07-16 NOTE — ED Notes (Signed)
Pt c/o of h/a for 10 mins brought in by ems friend w/ her states that they do nor speak english well also states that she is having abd pain lower

## 2011-07-16 NOTE — ED Notes (Signed)
Patient in good condition.  Patient awake, alert, and oriented x4.  Interpreter line used for discharge.  Patient verbalized understanding of discharge instructions.

## 2011-07-16 NOTE — ED Provider Notes (Signed)
Medical screening examination/treatment/procedure(s) were performed by non-physician practitioner and as supervising physician I was immediately available for consultation/collaboration.  Luiz Blare MD   Luiz Blare, MD 07/16/11 1023

## 2011-07-16 NOTE — ED Provider Notes (Signed)
History     CSN: [id number]  Arrival date & time 07/16/11  1309   First MD Initiated Contact with Patient 07/16/11 1331      Chief Complaint  Patient presents with  . Headache    (Consider location/radiation/quality/duration/timing/severity/associated sxs/prior treatment) HPI History is obtained from patient using [place] interpreter   language line.patient presents with headache gradual onset one week ago. Also complains of diffuse arthralgias and burning in arms and legs and falling several times due to lack of balance also complains of diffuse abdominal discomfort. Patient evaluated at [facility] 07/13/2011.diagnosed with PID prescribed hydrocodone-A. Pap though she reports she's taken no medicine for her headache . Past Medical History  Diagnosis Date  . Hearing loss     bilat.  Marland Kitchen Headache     due to left ear problem  . GERD (gastroesophageal reflux disease)     occasional - Zantac as needed  . Arthritis   . 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    Past Surgical History  Procedure Date  . Tympanoplasty 05/14/2011    Procedure: TYMPANOPLASTY;  Surgeon: Serena Colonel, MD;  Location: [facility];  Service: ENT;  Laterality: Left;  TYMPANOPLASYT AND  POSSIBLE OSSICULARPLASTY     No family history on file.  History  Substance Use Topics  . Smoking status: Never Smoker   . Smokeless tobacco: Never Used  . Alcohol Use: No    OB History    Grav Para Term Preterm Abortions TAB SAB Ect Mult Living                  Review of Systems  Constitutional: Negative.   Respiratory: Negative.   Cardiovascular: Negative.   Gastrointestinal: Positive for abdominal pain.  Genitourinary: Positive for vaginal discharge.       Vaginal discharge for 8 years, unchanged  Musculoskeletal: Positive for myalgias and arthralgias.  Skin: Negative.   Neurological: Positive for headaches.    Hematological: Negative.   Psychiatric/Behavioral: Negative.     Allergies  Review of patient's allergies indicates no known allergies.  Home Medications   Current Outpatient Rx  Name Route Sig Dispense Refill  . HYDROCODONE-ACETAMINOPHEN 7.5-500 MG PO TABS Oral Take 1 tablet by mouth every 6 (six) hours as needed. For pain.      BP 102/72  Pulse 63  Temp 97.5 F (36.4 C)  Resp 16  LMP 07/03/2011  Physical Exam  Nursing note and vitals reviewed. Constitutional: She appears well-developed and well-nourished.  HENT:  Head: Normocephalic and atraumatic.  Eyes: Conjunctivae are normal. Pupils are equal, round, and reactive to light.  Neck: Neck supple. No tracheal deviation present. No thyromegaly present.  Cardiovascular: Normal rate and regular rhythm.   No murmur heard. Pulmonary/Chest: Effort normal and breath sounds normal.  Abdominal: Soft. Bowel sounds are normal. She exhibits no distension. There is no tenderness.  Musculoskeletal: Normal range of motion. She exhibits no edema and no tenderness.  Neurological: She is alert. She has normal reflexes. Coordination normal.       Gait normal Romberg normal pronator drift normal motor strength 5 over 5 overall  Skin: Skin is warm and dry. No rash noted.  Psychiatric: She has a normal mood and affect.    ED Course  Procedures (including critical care time) 4 05 PM patient somnolent after treatment with hydrocodone. Arousable to ammonia capsule 5:25 PM this is alert  sitting up in bed eating states she feels much improved on exam she is alert Glasgow Coma Score 15, ambulatory Labs Reviewed  URINALYSIS, ROUTINE W REFLEX MICROSCOPIC - Abnormal; Notable for the following:    Hgb urine dipstick SMALL (*)     All other components within normal limits  POCT PREGNANCY, URINE  URINE MICROSCOPIC-ADD ON  COMPREHENSIVE METABOLIC PANEL  CBC  DIFFERENTIAL   No results found.   No diagnosis found.    MDM  Plan prescription  hydrocodone-A. Pap Referral [facility] Diagnosis #1 nonspecific headache #2 nonspecific abdominal pain #3 arthralgias         Doug Sou, MD 07/16/11 1712

## 2011-07-19 ENCOUNTER — Ambulatory Visit (INDEPENDENT_AMBULATORY_CARE_PROVIDER_SITE_OTHER): Payer: Medicaid Other | Admitting: Family Medicine

## 2011-07-19 ENCOUNTER — Encounter: Payer: Self-pay | Admitting: Family Medicine

## 2011-07-19 VITALS — BP 101/68 | HR 65 | Temp 98.2°F | Ht 60.0 in | Wt 120.0 lb

## 2011-07-19 DIAGNOSIS — R109 Unspecified abdominal pain: Secondary | ICD-10-CM

## 2011-07-19 DIAGNOSIS — A048 Other specified bacterial intestinal infections: Secondary | ICD-10-CM

## 2011-07-19 DIAGNOSIS — B9681 Helicobacter pylori [H. pylori] as the cause of diseases classified elsewhere: Secondary | ICD-10-CM

## 2011-07-19 DIAGNOSIS — R52 Pain, unspecified: Secondary | ICD-10-CM | POA: Insufficient documentation

## 2011-07-19 DIAGNOSIS — H9190 Unspecified hearing loss, unspecified ear: Secondary | ICD-10-CM

## 2011-07-19 MED ORDER — OMEPRAZOLE 20 MG PO CPDR: 20.0000 mg | DELAYED_RELEASE_CAPSULE | Freq: Two times a day (BID) | ORAL | Status: DC

## 2011-07-19 MED ORDER — AMOXICILLIN 500 MG PO CAPS: 1000.0000 mg | ORAL_CAPSULE | Freq: Two times a day (BID) | ORAL | Status: AC

## 2011-07-19 MED ORDER — METRONIDAZOLE 250 MG PO TABS: 250.0000 mg | ORAL_TABLET | Freq: Two times a day (BID) | ORAL | Status: AC

## 2011-07-19 NOTE — Assessment & Plan Note (Signed)
Multiple somatic symptoms difficult to characterize due to language barrier.  No evidence of inflammatory or neurologic etiology on CBC, CMET, physical exam.  Denies depression or anxiety as contributors.  Will follow-up in 3-4 weeks after tx for h pylori complete to reassess symptoms.

## 2011-07-19 NOTE — Assessment & Plan Note (Signed)
Many of symptoms likely due to H Pylori gastritis.  Will rx triple therapy, follow-up in 3-4 weeks.

## 2011-07-19 NOTE — Patient Instructions (Addendum)
Three medicine for stomach pain for 2 weeks  Can continue to take omeprazole once a day   Follow-up in 4 weeks

## 2011-07-19 NOTE — Progress Notes (Signed)
  Subjective:    Patient ID: Heather Savage, female    DOB: 11-Oct-1990, 21 y.o.   MRN: [id number]  HPI Here for evaluation of stomach pain and hand and feet pain x 2 weeks Here with a friend who interprets for her.  There is a significant language barrier and she also has some severe hearing loss.  States nausea, bad headache, abdominal pain.  When walking, has pain in her back and down the back of both legs.  Described as sharp.   Goes away when she sits. But when she sits, she gets abdominal pain.  When she eats heavy foods also gets this pain and throws up.  This has been going on  since childhood.  No dysuria.  States taking zantac makes symptoms go away including the numbness and tingling in hands and legs but does not want to have to take medicine regularly. Also reports taking hydrocodone from ER, takes some of symptoms away.  Headache causes bright spots or has a sharp pain.  Reports no history of previous headache.  Asked what symptoms is most bothersome, she said it is all painful, head, back, hands, feet.  Later during exam asked where she has pain in hands, points to palms but states not significant. There was much conflicting communication.  Was evaluated at urgent care on : dx with PID due to CMT.  Was given azithromycin and rocephin.  Cultures were negative.    Declines any stress or sadness.  When asked how she deal with stress, she states she cries.  When asked to quantify how often she cried in the past month, 2-3 days.  Was also evaluated in ER on June 17th with normal CBC, CMET, Upreg.  Has episode of decreased responsiveness after getting hydrocodone and responded to ammonia.   Review of Systems,No fever, no visual changes, no dizziness    Objective:   Physical Exam GEN: Alert & Oriented, No acute distress HEENT: [place]/AT. EOMI, PERRLA, no conjunctival injection or scleral icterus Left canal with drainage, swollen.  Right TM non intact chronically .  Nares without edema or  rhinorrhea.  Oropharynx is without erythema or exudates.  No anterior or posterior cervical lymphadenopathy. CV:  Regular Rate & Rhythm, no murmur Respiratory:  Normal work of breathing, CTAB Abd:  + BS, soft, TTP throguhout, no rebound or guarding.  Pain on palpation of back bilaterally in lumbar and thoracic areas. Ext: no pre-tibial edema        Assessment & Plan:

## 2011-08-14 ENCOUNTER — Ambulatory Visit: Payer: Medicaid Other | Admitting: Family Medicine

## 2011-08-21 ENCOUNTER — Encounter (HOSPITAL_BASED_OUTPATIENT_CLINIC_OR_DEPARTMENT_OTHER): Payer: Self-pay | Admitting: *Deleted

## 2011-08-21 ENCOUNTER — Other Ambulatory Visit: Payer: Self-pay | Admitting: Family Medicine

## 2011-08-21 ENCOUNTER — Ambulatory Visit (INDEPENDENT_AMBULATORY_CARE_PROVIDER_SITE_OTHER): Payer: Medicaid Other | Admitting: Family Medicine

## 2011-08-21 ENCOUNTER — Encounter: Payer: Self-pay | Admitting: Family Medicine

## 2011-08-21 VITALS — BP 117/71 | HR 72 | Temp 98.4°F | Wt 121.0 lb

## 2011-08-21 DIAGNOSIS — A048 Other specified bacterial intestinal infections: Secondary | ICD-10-CM

## 2011-08-21 DIAGNOSIS — R52 Pain, unspecified: Secondary | ICD-10-CM

## 2011-08-21 DIAGNOSIS — B9681 Helicobacter pylori [H. pylori] as the cause of diseases classified elsewhere: Secondary | ICD-10-CM

## 2011-08-21 LAB — BASIC METABOLIC PANEL
BUN: 9 mg/dL (ref 6–23)
CO2: 26 mEq/L (ref 19–32)
Chloride: 104 mEq/L (ref 96–112)
Glucose, Bld: 94 mg/dL (ref 70–99)
Potassium: 3.9 mEq/L (ref 3.5–5.3)
Sodium: 138 mEq/L (ref 135–145)

## 2011-08-21 LAB — VITAMIN B12: Vitamin B-12: 269 pg/mL (ref 211–911)

## 2011-08-21 LAB — SEDIMENTATION RATE: Sed Rate: 1 mm/hr (ref 0–22)

## 2011-08-21 MED ORDER — OMEPRAZOLE 20 MG PO CPDR: 20.0000 mg | DELAYED_RELEASE_CAPSULE | Freq: Every day | ORAL | Status: DC

## 2011-08-21 NOTE — Patient Instructions (Addendum)
Will call you with lab results to find out reason why you have burning pain.  If you don't get a call or letter in two weeks, pelase call out office  I have refilled omeprazole for your stomach pain  Take a multivitamin every day (like women's one a day or centrum)

## 2011-08-21 NOTE — Progress Notes (Signed)
  Subjective:    Patient ID: Heather Savage, female    DOB: [birth year], 21 y.o.   MRN: [id number]  HPI  Here for follow-up  GERD: Was treated for hy pylori gastritis at last visit.  Abdominal pain improved.  Continued to have burning pain in chest several hours after meals.  States was able to take two weeks course of triple therapy.  Not currently taking omeprazole.  Burning better when on omeprazole.    Hand/feet pain:  2 years.  Now worsening.  Constant.  Worse when she wears closed toes shoes.  Sometimes wakes her up with pain.  No swelling.  Not sensitive to cold- ice cubes actually helps with the burning pain.  Located on palms and soles. No known industrial exposures  I have reviewed patient's  PMH, FH, and Social history and Medications as related to this visit. No family history of similar  Review of Systems See HPI    Objective:   Physical Exam GEN: Alert & Oriented, No acute distress.  Here with friend as interpreter CV:  Regular Rate & Rhythm, no murmur Respiratory:  Normal work of breathing, CTAB Hands and feet:  No rash.  Normal sensation to light touch and monofilament.  Pulses normal.  No nail changes.       Assessment & Plan:

## 2011-08-21 NOTE — Assessment & Plan Note (Signed)
Now treated.  Will refill omeprazole.  If does not continue to improvement, would recheck h pylori test, possible resistence.

## 2011-08-21 NOTE — Assessment & Plan Note (Addendum)
2 years of burning pain in palms and soles.  No sensory loss or weakness.  Large differential at this point.  No evidence of inflammatory arthropathy or central neurological process.  Will check fasting glucose, b12, lead, rpr, hiv, hepatitis and sed rate.  Will discuss results with patient.  Update: All labs norma, TSH pending.

## 2011-08-22 ENCOUNTER — Encounter: Payer: Self-pay | Admitting: Family Medicine

## 2011-08-22 LAB — LEAD, BLOOD: Lead-Whole Blood: <1 ug/dL (ref <25.0)

## 2011-08-22 LAB — HEPATITIS B SURFACE ANTIGEN: Hepatitis B Surface Ag: NEGATIVE

## 2011-08-24 LAB — TSH: TSH: 0.748 u[IU]/mL (ref 0.350–4.500)

## 2011-08-24 NOTE — Addendum Note (Signed)
Addended by: Macy Mis on: 08/24/2011 03:35 PM   Modules accepted: Orders

## 2011-08-27 ENCOUNTER — Encounter (HOSPITAL_BASED_OUTPATIENT_CLINIC_OR_DEPARTMENT_OTHER)
Admission: RE | Disposition: A | Discharge status: Home / Self Care | Payer: Self-pay | Source: Ambulatory Visit | Attending: Specialist

## 2011-08-27 ENCOUNTER — Encounter (HOSPITAL_BASED_OUTPATIENT_CLINIC_OR_DEPARTMENT_OTHER): Payer: Self-pay | Admitting: Anesthesiology

## 2011-08-27 ENCOUNTER — Ambulatory Visit (HOSPITAL_BASED_OUTPATIENT_CLINIC_OR_DEPARTMENT_OTHER): Payer: Medicaid Other | Admitting: Anesthesiology

## 2011-08-27 ENCOUNTER — Encounter (HOSPITAL_BASED_OUTPATIENT_CLINIC_OR_DEPARTMENT_OTHER): Payer: Self-pay

## 2011-08-27 ENCOUNTER — Ambulatory Visit (HOSPITAL_BASED_OUTPATIENT_CLINIC_OR_DEPARTMENT_OTHER)
Admission: RE | Admit: 2011-08-27 | Discharge: 2011-08-27 | Disposition: A | Discharge status: Home / Self Care | Payer: Medicaid Other | Source: Ambulatory Visit | Attending: Specialist | Admitting: Specialist

## 2011-08-27 DIAGNOSIS — L905 Scar conditions and fibrosis of skin: Secondary | ICD-10-CM | POA: Insufficient documentation

## 2011-08-27 DIAGNOSIS — K219 Gastro-esophageal reflux disease without esophagitis: Secondary | ICD-10-CM | POA: Insufficient documentation

## 2011-08-27 HISTORY — PX: SCAR REVISION: SHX5285

## 2011-08-27 LAB — POCT HEMOGLOBIN-HEMACUE: Hemoglobin: 12.4 g/dL (ref 12.0–15.0)

## 2011-08-27 SURGERY — REVISION, SCAR
Anesthesia: General | Site: Face | Wound class: Clean

## 2011-08-27 MED ORDER — MIDAZOLAM HCL 2 MG/2ML IJ SOLN: 1.0000 mg | INTRAMUSCULAR | Status: DC | PRN

## 2011-08-27 MED ORDER — DEXAMETHASONE SODIUM PHOSPHATE 4 MG/ML IJ SOLN
INTRAMUSCULAR | Status: DC | PRN
  Administered 2011-08-27: 10 mg via INTRAVENOUS

## 2011-08-27 MED ORDER — PROPOFOL 10 MG/ML IV EMUL
INTRAVENOUS | Status: DC | PRN
  Administered 2011-08-27: 50 mg via INTRAVENOUS
  Administered 2011-08-27: 150 mg via INTRAVENOUS

## 2011-08-27 MED ORDER — LACTATED RINGERS IV SOLN
INTRAVENOUS | Status: DC
  Administered 2011-08-27 (×2): via INTRAVENOUS

## 2011-08-27 MED ORDER — FENTANYL CITRATE 0.05 MG/ML IJ SOLN: 25.0000 ug | INTRAMUSCULAR | Status: DC | PRN

## 2011-08-27 MED ORDER — FENTANYL CITRATE 0.05 MG/ML IJ SOLN
50.0000 ug | INTRAMUSCULAR | Status: DC | PRN
  Administered 2011-08-27: 50 ug via INTRAVENOUS

## 2011-08-27 MED ORDER — MIDAZOLAM HCL 5 MG/5ML IJ SOLN
INTRAMUSCULAR | Status: DC | PRN
  Administered 2011-08-27: 2 mg via INTRAVENOUS

## 2011-08-27 MED ORDER — LIDOCAINE HCL (CARDIAC) 20 MG/ML IV SOLN
INTRAVENOUS | Status: DC | PRN
  Administered 2011-08-27: 50 mg via INTRAVENOUS

## 2011-08-27 MED ORDER — FENTANYL CITRATE 0.05 MG/ML IJ SOLN
INTRAMUSCULAR | Status: DC | PRN
  Administered 2011-08-27: 50 ug via INTRAVENOUS

## 2011-08-27 MED ORDER — PROMETHAZINE HCL 25 MG/ML IJ SOLN: 6.2500 mg | INTRAMUSCULAR | Status: DC | PRN

## 2011-08-27 MED ORDER — LORAZEPAM 2 MG/ML IJ SOLN: 1.0000 mg | Freq: Once | INTRAMUSCULAR | Status: DC | PRN

## 2011-08-27 MED ORDER — CEFAZOLIN SODIUM-DEXTROSE 2-3 GM-% IV SOLR
2.0000 g | INTRAVENOUS | Status: AC
  Administered 2011-08-27: 1 g via INTRAVENOUS

## 2011-08-27 MED ORDER — ONDANSETRON HCL 4 MG/2ML IJ SOLN
INTRAMUSCULAR | Status: DC | PRN
  Administered 2011-08-27: 4 mg via INTRAVENOUS

## 2011-08-27 MED ORDER — LIDOCAINE-EPINEPHRINE 0.5 %-1:200000 IJ SOLN
INTRAMUSCULAR | Status: DC | PRN
  Administered 2011-08-27: 4 mL

## 2011-08-27 SURGICAL SUPPLY — 63 items
BAG DECANTER FOR FLEXI CONT (MISCELLANEOUS) IMPLANT
BANDAGE ELASTIC 4 VELCRO ST LF (GAUZE/BANDAGES/DRESSINGS) IMPLANT
BANDAGE GAUZE ELAST BULKY 4 IN (GAUZE/BANDAGES/DRESSINGS) IMPLANT
BENZOIN TINCTURE PRP APPL 2/3 (GAUZE/BANDAGES/DRESSINGS) IMPLANT
BLADE KNIFE PERSONA 15 (BLADE) ×2 IMPLANT
BNDG COHESIVE 4X5 TAN STRL (GAUZE/BANDAGES/DRESSINGS) IMPLANT
CANISTER SUCTION 1200CC (MISCELLANEOUS) IMPLANT
CLOTH BEACON ORANGE TIMEOUT ST (SAFETY) ×2 IMPLANT
COVER MAYO STAND STRL (DRAPES) ×2 IMPLANT
COVER TABLE BACK 60X90 (DRAPES) ×2 IMPLANT
DECANTER SPIKE VIAL GLASS SM (MISCELLANEOUS) IMPLANT
DRAPE LAPAROTOMY TRNSV 102X78 (DRAPE) IMPLANT
DRAPE PED LAPAROTOMY (DRAPES) ×2 IMPLANT
DRAPE U-SHAPE 76X120 STRL (DRAPES) ×2 IMPLANT
DRSG PAD ABDOMINAL 8X10 ST (GAUZE/BANDAGES/DRESSINGS) IMPLANT
ELECT NEEDLE TIP 2.8 STRL (NEEDLE) ×2 IMPLANT
ELECT REM PT RETURN 9FT ADLT (ELECTROSURGICAL) ×2
ELECTRODE REM PT RTRN 9FT ADLT (ELECTROSURGICAL) ×1 IMPLANT
FILTER 7/8 IN (FILTER) IMPLANT
GAUZE SPONGE 4X4 16PLY XRAY LF (GAUZE/BANDAGES/DRESSINGS) IMPLANT
GAUZE XEROFORM 1X8 LF (GAUZE/BANDAGES/DRESSINGS) IMPLANT
GAUZE XEROFORM 5X9 LF (GAUZE/BANDAGES/DRESSINGS) IMPLANT
GLOVE BIO SURGEON STRL SZ 6.5 (GLOVE) ×4 IMPLANT
GLOVE BIOGEL M STRL SZ7.5 (GLOVE) ×2 IMPLANT
GLOVE BIOGEL PI IND STRL 8 (GLOVE) ×1 IMPLANT
GLOVE BIOGEL PI INDICATOR 8 (GLOVE) ×1
GLOVE ECLIPSE 7.0 STRL STRAW (GLOVE) ×2 IMPLANT
GOWN PREVENTION PLUS XLARGE (GOWN DISPOSABLE) ×2 IMPLANT
GOWN PREVENTION PLUS XXLARGE (GOWN DISPOSABLE) ×4 IMPLANT
GOWN STRL REIN 2XL LVL4 (GOWN DISPOSABLE) ×4 IMPLANT
IV NS 500ML (IV SOLUTION)
IV NS 500ML BAXH (IV SOLUTION) IMPLANT
NEEDLE HYPO 25X1 1.5 SAFETY (NEEDLE) ×2 IMPLANT
NEEDLE SPNL 18GX3.5 QUINCKE PK (NEEDLE) IMPLANT
PACK BASIN DAY SURGERY FS (CUSTOM PROCEDURE TRAY) ×2 IMPLANT
SHEET MEDIUM DRAPE 40X70 STRL (DRAPES) IMPLANT
SHEETING SILICONE GEL EPI DERM (MISCELLANEOUS) IMPLANT
SPONGE GAUZE 4X4 12PLY (GAUZE/BANDAGES/DRESSINGS) ×4 IMPLANT
SPONGE LAP 18X18 X RAY DECT (DISPOSABLE) ×2 IMPLANT
STOCKINETTE IMPERVIOUS LG (DRAPES) IMPLANT
STRIP CLOSURE SKIN 1/8X3 (GAUZE/BANDAGES/DRESSINGS) ×2 IMPLANT
STRIP SUTURE WOUND CLOSURE 1/2 (SUTURE) IMPLANT
SUT FIBERWIRE 3-0 18 DIAM 3/8 (SUTURE)
SUT MNCRL AB 3-0 PS2 18 (SUTURE) ×2 IMPLANT
SUT MON AB 2-0 CT1 36 (SUTURE) IMPLANT
SUT MON AB 4-0 PC3 18 (SUTURE) IMPLANT
SUT MON AB 5-0 PS2 18 (SUTURE) IMPLANT
SUT PROLENE 4 0 PS 2 18 (SUTURE) IMPLANT
SUT PROLENE 5 0 P 3 (SUTURE) IMPLANT
SUT PROLENE 5 0 PS 2 (SUTURE) IMPLANT
SUT PROLENE 6 0 P 1 18 (SUTURE) ×2 IMPLANT
SUT STRIPS 1/4 X 4 INCH (SUTURE) IMPLANT
SUTURE FIBERWR 3-0 18 DIAM 3/8 (SUTURE) IMPLANT
SYR 20CC LL (SYRINGE) IMPLANT
SYR BULB IRRIGATION 50ML (SYRINGE) IMPLANT
SYR CONTROL 10ML LL (SYRINGE) ×2 IMPLANT
TAPE PAPER MEDFIX 1IN X 10YD (GAUZE/BANDAGES/DRESSINGS) IMPLANT
TOWEL OR 17X24 6PK STRL BLUE (TOWEL DISPOSABLE) ×4 IMPLANT
TUBE CONNECTING 20X1/4 (TUBING) IMPLANT
UNDERPAD 30X30 INCONTINENT (UNDERPADS AND DIAPERS) ×2 IMPLANT
VAC PENCILS W/TUBING CLEAR (MISCELLANEOUS) IMPLANT
WATER STERILE IRR 1000ML POUR (IV SOLUTION) IMPLANT
YANKAUER SUCT BULB TIP NO VENT (SUCTIONS) IMPLANT

## 2011-08-27 NOTE — Anesthesia Postprocedure Evaluation (Signed)
  Anesthesia Post-op Note  Patient: Heather Savage  Procedure(s) Performed: Procedure(s) (LRB): SCAR REVISION (N/A)  Patient Location: PACU  Anesthesia Type: General  Level of Consciousness: awake  Airway and Oxygen Therapy: Patient Spontanous Breathing  Post-op Pain: mild  Post-op Assessment: Post-op Vital signs reviewed, Patient's Cardiovascular Status Stable, Respiratory Function Stable, Patent Airway, No signs of Nausea or vomiting and Pain level controlled  Post-op Vital Signs: stable  Complications: No apparent anesthesia complications

## 2011-08-27 NOTE — H&P (Signed)
Heather Savage is an 21 y.o. female.   Chief Complaint: Severe scar circatrix forehead HPI: Traumatic scar from previous trauma  Past Medical History  Diagnosis Date  . Hearing loss     right  . GERD (gastroesophageal reflux disease)   . Scar of forehead 07/2011  . Burning sensation of feet 08/21/2011    and hands; saw MD 08/21/2011    Past Surgical History  Procedure Date  . Tympanoplasty 05/14/2011    Procedure: TYMPANOPLASTY;  Surgeon: Serena Colonel, MD;  Location: [facility];  Service: ENT;  Laterality: Left;  TYMPANOPLASYT AND  POSSIBLE OSSICULARPLASTY     History reviewed. No pertinent family history. Social History:  reports that she has never smoked. She has never used smokeless tobacco. She reports that she does not drink alcohol or use illicit drugs.  Allergies: No Known Allergies  Medications Prior to Admission  Medication Sig Dispense Refill  . omeprazole (PRILOSEC) 20 MG capsule Take 1 capsule (20 mg total) by mouth daily.  30 capsule  11    No results found for this or any previous visit (from the past 48 hour(s)). No results found.  Review of Systems  Constitutional: Negative.   Eyes: Negative.   Respiratory: Negative.   Cardiovascular: Negative.   Gastrointestinal: Negative.   Musculoskeletal: Negative.   Skin: Positive for rash.  Neurological: Negative.   Endo/Heme/Allergies: Negative.   Psychiatric/Behavioral: Negative.     Blood pressure 107/68, pulse 56, temperature 97.6 F (36.4 C), temperature source Oral, resp. rate 20, height 5' 4.96" (1.65 m), weight 54.885 kg (121 lb), last menstrual period 08/06/2011, SpO2 100.00%. Physical Exam   Assessment/Plan Excision and flap reconstruction  Marshon Bangs L 08/27/2011, 11:10 AM

## 2011-08-27 NOTE — Progress Notes (Signed)
Dr. Gypsy Balsam at bedside and observed pt trying to get up  -- friendDelrae Rend) at bedside trying to keep her in bed and interpreting for her -- does not speak [place]

## 2011-08-27 NOTE — Brief Op Note (Signed)
08/27/2011  12:07 PM  PATIENT:  Heather Savage  21 y.o. female  PRE-OPERATIVE DIAGNOSIS:  forehead scar  POST-OPERATIVE DIAGNOSIS:  forehead scar  PROCEDURE:  Procedure(s) (LRB): SCAR REVISION (N/A)  SURGEON:  Surgeon(s) and Role:    * Louisa Second, MD - Primary  PHYSICIAN ASSISTANT:   ASSISTANTS: none   ANESTHESIA:   general  EBL:  Total I/O In: 1000 [I.V.:1000] Out: -   BLOOD ADMINISTERED:none  DRAINS: none   LOCAL MEDICATIONS USED:  LIDOCAINE   SPECIMEN:  Excision  DISPOSITION OF SPECIMEN:  PATHOLOGY  COUNTS:  YES  TOURNIQUET:  * No tourniquets in log *  DICTATION: .Other Dictation: Dictation Number [id number]  PLAN OF CARE: Discharge to home after PACU  PATIENT DISPOSITION:  PACU - hemodynamically stable.   Delay start of Pharmacological VTE agent (>24hrs) due to surgical blood loss or risk of bleeding: yes

## 2011-08-27 NOTE — Transfer of Care (Signed)
Immediate Anesthesia Transfer of Care Note  Patient: Heather Savage  Procedure(s) Performed: Procedure(s) (LRB): SCAR REVISION (N/A)  Patient Location: PACU  Anesthesia Type: General  Level of Consciousness: awake  Airway & Oxygen Therapy: Patient Spontanous Breathing and Patient connected to face mask oxygen  Post-op Assessment: Report given to PACU RN and Post -op Vital signs reviewed and stable  Post vital signs: Reviewed and stable  Complications: No apparent anesthesia complications

## 2011-08-27 NOTE — Anesthesia Procedure Notes (Signed)
Procedure Name: LMA Insertion Performed by: Dewanda Fennema W Pre-anesthesia Checklist: Patient identified, Timeout performed, Emergency Drugs available, Suction available and Patient being monitored Patient Re-evaluated:Patient Re-evaluated prior to inductionOxygen Delivery Method: Circle system utilized Preoxygenation: Pre-oxygenation with 100% oxygen Intubation Type: IV induction Ventilation: Mask ventilation without difficulty LMA: LMA inserted LMA Size: 4.0 Number of attempts: 1 Placement Confirmation: breath sounds checked- equal and bilateral and positive ETCO2 Tube secured with: Tape Dental Injury: Teeth and Oropharynx as per pre-operative assessment      

## 2011-08-27 NOTE — Progress Notes (Signed)
Notified Dr.Kasik of pt trying to get out of bed - Dr. Shon Hough wanted me to let Dr. Gypsy Balsam know.

## 2011-08-27 NOTE — Anesthesia Preprocedure Evaluation (Signed)
Anesthesia Evaluation  Patient identified by MRN, date of birth, ID band Patient awake    Reviewed: Allergy & Precautions, H&P , NPO status , Patient's Chart, lab work & pertinent test results  Airway Mallampati: I TM Distance: >3 FB Neck ROM: Full    Dental   Pulmonary    Pulmonary exam normal       Cardiovascular     Neuro/Psych    GI/Hepatic GERD-  Controlled and Medicated,  Endo/Other    Renal/GU      Musculoskeletal   Abdominal   Peds  Hematology   Anesthesia Other Findings   Reproductive/Obstetrics                           Anesthesia Physical Anesthesia Plan  ASA: I  Anesthesia Plan: General   Post-op Pain Management:    Induction: Intravenous  Airway Management Planned: LMA  Additional Equipment:   Intra-op Plan:   Post-operative Plan: Extubation in OR  Informed Consent: I have reviewed the patients History and Physical, chart, labs and discussed the procedure including the risks, benefits and alternatives for the proposed anesthesia with the patient or authorized representative who has indicated his/her understanding and acceptance.     Plan Discussed with: CRNA and Surgeon  Anesthesia Plan Comments:         Anesthesia Quick Evaluation

## 2011-08-28 ENCOUNTER — Encounter (HOSPITAL_BASED_OUTPATIENT_CLINIC_OR_DEPARTMENT_OTHER): Payer: Self-pay | Admitting: Specialist

## 2011-08-28 NOTE — Op Note (Signed)
[name]  SABRYN, PRESLAR NO.:  [id number]  MEDICAL RECORD NO.:  [id number]  LOCATION:                                 FACILITY:  PHYSICIAN:  Earvin Hansen L. 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  PROCEDURES DONE:  Excision of scar cicatrix and advancement flap, rotational coverage, and closure.  ANESTHESIA:  General.  Preoperatively, the patient sat up and drawn for the lesion.  She underwent general anesthesia, intubated orally.  Prep was done to the face area with Betadine solution, walled off with sterile towels and drapes, so as to make a sterile field.  A 0.5% Xylocaine with epinephrine injected locally, a total of 12 mL, 1: 200,000 concentration.  This would allow this to set up and then excision of the scar was done totally down to underlying periosteum.  I was able to free up the flaps laterally on each side approximately 2 cm to allow an advancement flap closure and deep sutures placed with 2-0 Monocryl x2 layers, then the subdermal suture of 5-0 Monocryl, then a running subcuticular stitch of 3-0 Monocryl.  A dry Steri-Strips and a bulky dressing were applied to all the of incision areas and forehead, covered with Kerlix wrap.  She withstood the procedure very well and was taken to the recovery room in excellent condition.     Yaakov Guthrie. Shon Hough, M.D.     Cathie Hoops  D:  08/27/2011  T:  08/28/2011  Job:  [id number]

## 2011-08-31 ENCOUNTER — Encounter (HOSPITAL_BASED_OUTPATIENT_CLINIC_OR_DEPARTMENT_OTHER): Payer: Self-pay

## 2011-09-03 ENCOUNTER — Telehealth: Payer: Self-pay | Admitting: Family Medicine

## 2011-09-03 DIAGNOSIS — R52 Pain, unspecified: Secondary | ICD-10-CM

## 2011-09-03 NOTE — Assessment & Plan Note (Signed)
Called with [employer] to discuss normal lab results for workup of peripheral neuropathy.  B12 on low side of normal  Workup otherwise normal with CBC, BMET, TSH, ESR, HIV, RPR, Hep B, Hep C, lead level.  Advised multivitamin and B compelx vitamin empirically.  If no improvement will refer to neurology.

## 2011-09-03 NOTE — Telephone Encounter (Signed)
Called with [employer] to discuss normal lab results for workup of peripheral neuropathy.  B12 on low side of normal  Workup otherwise normal with CBC, BMET, TSH, ESR, HIV, RPR, Hep B, Hep C, lead level.  Advised multivitamin and B compelx vitamin empirically.  If no improvement will refer to neurology.  Also noted her gastritis is better, but she has new anal blisters.  Discussed not likely due to medication, encouraged her to come in to be seen.  Will schedule her for 3:15 tomorrow.

## 2011-09-04 ENCOUNTER — Encounter: Payer: Self-pay | Admitting: Family Medicine

## 2011-09-04 ENCOUNTER — Ambulatory Visit (INDEPENDENT_AMBULATORY_CARE_PROVIDER_SITE_OTHER): Payer: Medicaid Other | Admitting: Family Medicine

## 2011-09-04 VITALS — BP 130/85 | HR 81 | Ht 60.0 in | Wt 119.8 lb

## 2011-09-04 DIAGNOSIS — R4589 Other symptoms and signs involving emotional state: Secondary | ICD-10-CM

## 2011-09-04 DIAGNOSIS — F341 Dysthymic disorder: Secondary | ICD-10-CM

## 2011-09-04 DIAGNOSIS — R319 Hematuria, unspecified: Secondary | ICD-10-CM

## 2011-09-04 DIAGNOSIS — R300 Dysuria: Secondary | ICD-10-CM

## 2011-09-04 DIAGNOSIS — K5900 Constipation, unspecified: Secondary | ICD-10-CM

## 2011-09-04 DIAGNOSIS — R52 Pain, unspecified: Secondary | ICD-10-CM

## 2011-09-04 LAB — POCT UA - MICROSCOPIC ONLY

## 2011-09-04 LAB — POCT URINALYSIS DIPSTICK
Bilirubin, UA: NEGATIVE
Glucose, UA: NEGATIVE
Leukocytes, UA: NEGATIVE
Nitrite, UA: NEGATIVE
pH, UA: 6

## 2011-09-04 MED ORDER — DOCUSATE SODIUM 100 MG PO CAPS: 100.0000 mg | ORAL_CAPSULE | Freq: Two times a day (BID) | ORAL | Status: AC

## 2011-09-04 NOTE — Progress Notes (Signed)
  Subjective:    Patient ID: Heather Savage, female    DOB: January 01, 1991, 21 y.o.   MRN: [id number]  HPI  Work in appt for anal blisters that we discussed on the phone while calling her for results.  Conversation today by professional interpreter from language [employer]- he has no relation to her.  Intermittent.  Described as very painful with urination.  Some bleeding.  Also notes bowel movements are very difficult, daily, are hard.  Later, after exam, it appears there is no dysuria, but in fact it was pain with defecation and some blood  Reports no vaginal discharge.  Wet prep and GC/Chlamyda last month were normal..  Patient was tearful during exam.  States something is bothering her but she is ok.   She states she is sad sometimes.  I inquired about her personal life with her permission, she is a recent immigrant, reports living alone.  She does have a sexual partner but states it is not the man who has accompanied her to multiple office visits.  She described him as a friend.  States she feels safe in her home.  Does not wish to discuss further.    I have reviewed patient's  PMH, FH, and Social history and Medications as related to this visit. Has forehead plastic surgery last week for old scar tissue.    Review of Systemssee HPI    Objective:   Physical Exam GEN:  NAD, tearful at times. Pelvic Exam:        External: normal female genitalia without lesions or masses        Rectum:  Small skin tag suggestive of previous hemorrhoid.  No evidence of HSV, condyloma, anal fissures, skin irritation.       Assessment & Plan:

## 2011-09-04 NOTE — Assessment & Plan Note (Signed)
Difficult history.  I suspect this is why she has painful defecation.  No evidence of trauma or infection to the area.  Will rx colace, discussed increasing water and fiber in diet.

## 2011-09-04 NOTE — Patient Instructions (Addendum)
Drink more water  Eats more fruits and vegetables, while grains  for fiber  Take colace once a day to soften stools  Follow up in 2-4 weeks

## 2011-09-04 NOTE — Assessment & Plan Note (Signed)
No evidence of acute danger or compromise of safety.  I invited her to return to discuss further when she feels ready.  Interpreter will facilitate by contacting agency for a female interpreter, otherwise will plan to use language line on phone.  Will follow-up in 2-4 weeks

## 2011-09-28 ENCOUNTER — Encounter: Payer: Self-pay | Admitting: Family Medicine

## 2011-09-28 ENCOUNTER — Ambulatory Visit (INDEPENDENT_AMBULATORY_CARE_PROVIDER_SITE_OTHER): Payer: Medicaid Other | Admitting: Family Medicine

## 2011-09-28 VITALS — BP 114/64 | Temp 98.0°F | Ht 60.0 in | Wt 124.4 lb

## 2011-09-28 DIAGNOSIS — K5900 Constipation, unspecified: Secondary | ICD-10-CM

## 2011-09-28 DIAGNOSIS — R4589 Other symptoms and signs involving emotional state: Secondary | ICD-10-CM

## 2011-09-28 DIAGNOSIS — F341 Dysthymic disorder: Secondary | ICD-10-CM

## 2011-09-28 DIAGNOSIS — L509 Urticaria, unspecified: Secondary | ICD-10-CM

## 2011-09-28 MED ORDER — POLYETHYLENE GLYCOL 3350 17 GM/SCOOP PO POWD: 17.0000 g | Freq: Two times a day (BID) | ORAL | Status: AC | PRN

## 2011-09-28 MED ORDER — CETIRIZINE HCL 10 MG PO TABS: 10.0000 mg | ORAL_TABLET | Freq: Every day | ORAL | Status: DC

## 2011-09-28 NOTE — Assessment & Plan Note (Signed)
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  Will terat empirically with daily cetrizine.  Asked her to watch for possible triggers, return prn.

## 2011-09-28 NOTE — Assessment & Plan Note (Signed)
Declines to discuss today. No external signs of abuse.

## 2011-09-28 NOTE — Assessment & Plan Note (Signed)
Bleeding due to external hemorrhoids, hard stools.  Will use miralax, discuss drinking more fluids-titrate to clear urine- drinks only one glass of water a day.  Discussed most people improve with stool regimen, advised to return if no improvement, would discuss if need for surgery

## 2011-09-28 NOTE — Progress Notes (Signed)
  Subjective:    Patient ID: Heather Savage, female    DOB: 05-22-1990, 21 y.o.   MRN: [id number]  HPIFollow-up for constipation  Using tigrinian interpreter in person  Constipation:  Was prescribed colace- taking twice a day.  Constipation minimally  Reports hard dry stools, once every 1-2 days.  Notices blood tinged stools.  Rectal exam performed at last visit showed hemorrhoids.  At times has hemorrhoids she feels externally.  No significant pain today.  Rash-  Itching, all over body and  on mouth and lips for the past week.  Has never had a rash like this before.   No dyspnea or dysphagia.  No new medicines, detergents, soaps.  Not worsening.  Feels is very mild at the moment but at times has what sounds to be hives  I told her I was concerned about how sad she looked at the last visit and asked if I could help.  She looked sad and declined.  Is planning second tympanostomy next month  I have reviewed patient's  PMH, FH, and Social history and Medications as related to this visit.  Review of Systems See HPI    Objective:   Physical Exam GEN: Alert & Oriented, No acute distress Skin:  Some small papules, no significant rash,.         Assessment & Plan:

## 2011-09-28 NOTE — Patient Instructions (Addendum)
Miralax powder- one to two capfuls a day for constipation  Allergy medicine for itchy skin once a day- cetirizine

## 2011-10-02 ENCOUNTER — Encounter (HOSPITAL_BASED_OUTPATIENT_CLINIC_OR_DEPARTMENT_OTHER): Payer: Self-pay | Admitting: *Deleted

## 2011-10-05 ENCOUNTER — Emergency Department (HOSPITAL_COMMUNITY)
Admission: EM | Admit: 2011-10-05 | Discharge: 2011-10-05 | Disposition: A | Discharge status: Home / Self Care | Payer: Medicaid Other | Attending: Emergency Medicine | Admitting: Emergency Medicine

## 2011-10-05 ENCOUNTER — Encounter (HOSPITAL_COMMUNITY): Payer: Self-pay | Admitting: *Deleted

## 2011-10-05 DIAGNOSIS — J029 Acute pharyngitis, unspecified: Secondary | ICD-10-CM

## 2011-10-05 NOTE — ED Notes (Signed)
BIB family for headache, sore throat and fever X2 days.  VS pending

## 2011-10-05 NOTE — ED Notes (Signed)
During discharge teaching friend stated patient wanted the doctor to check her head. Linker MD notified. She spoke with the patient about her illness and discharge teaching and asked if they had more questions. They stated no and left without signing. When RN went into room patient and friends were gone.

## 2011-10-05 NOTE — H&P (Signed)
  Assessment  . Discharge from the ears   (388.60) . 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 Surgery scheduled for Monday. On exam, there is cerumen buildup on the left that was cleaned out under the microscope. The drum is intact. On the right there is a large amount of purulence and fungal debris that was suctioned out. The middle ear mucosa is edematous. CSF powder was heavily applied. Proceed with surgery Monday unless she has worsening pain or discharge prior to then. Reason For Visit  Now having pain in her right ear. Allergies  No Known Drug Allergies. Current Meds  Omeprazole 20 MG Oral Capsule Delayed Release;; RPT. 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 PSH  Tympanoplasty Tympanoplasty 14 May 2011; left. Personal Hx  Marital History - Single Never A Smoker Never Drank Alcohol. Signature  Electronically signed by : Serena Colonel  M.D.; 10/02/2011 4:51 PM EST.

## 2011-10-05 NOTE — ED Provider Notes (Signed)
History     CSN: [id number]  Arrival date & time 10/05/11  2057   First MD Initiated Contact with Patient 10/05/11 2144      Chief Complaint  Patient presents with  . Headache  . Fever  . Sore Throat    (Consider location/radiation/quality/duration/timing/severity/associated sxs/prior treatment) HPI Patient presents with complaint of headache and sore throat. She has also had a subjective fever. Her symptoms began 2 days ago. She has not had any cough or nasal congestion. No difficulty breathing. No vomiting or abdominal pain. She has not taken anything for her symptoms. Her immunizations are up-to-date and she has had no recent travel. Sore throat is worse with eating and drinking. She has continued to drink liquids without difficulty however.  There are no other associated systemic symptoms, there are no other alleviating or modifying factors.   Past Medical History  Diagnosis Date  . Malaria     last year (2012)    Past Surgical History  Procedure Date  . Inner ear surgery     No family history on file.  History  Substance Use Topics  . Smoking status: Not on file  . Smokeless tobacco: Not on file  . Alcohol Use:     OB History    Grav Para Term Preterm Abortions TAB SAB Ect Mult Living                  Review of Systems ROS reviewed and all otherwise negative except for mentioned in HPI  Allergies  Review of patient's allergies indicates no known allergies.  Home Medications  No current outpatient prescriptions on file.  BP 115/68  Pulse 84  Temp 97.4 F (36.3 C) (Oral)  Resp 15  Wt 120 lb 8 oz (54.658 kg)  SpO2 99% Vitals reviewed Physical Exam Physical Examination: General appearance - alert, well appearing, and in no distress Mental status - alert, oriented to person, place, and time Eyes - pupils equal and reactive, extraocular eye movements intact Mouth - mucous membranes moist, pharynx normal without lesions, no sig erythema of OP Neck -  supple, no significant adenopathy Chest - clear to auscultation, no wheezes, rales or rhonchi, symmetric air entry Heart - normal rate, regular rhythm, normal S1, S2, no murmurs, rubs, clicks or gallops Abdomen - soft, nontender, nondistended, no masses or organomegaly Extremities - peripheral pulses normal, no pedal edema, no clubbing or cyanosis, brisk cap refill Skin - normal coloration and turgor, no rashes  ED Course  Procedures (including critical care time)   Labs Reviewed  RAPID STREP SCREEN   No results found.   1. Viral pharyngitis       MDM  Pt presenting with c/o sore throat, subjective fever and frontal headache.  Rapid strep screen negative.  Pt is overall well appearing with normal vital signs.  Suspect viral infection/pharyngitis.  Pt discharged with strict return precautions.  Mom agreeable with plan        Ethelda Chick, MD 10/05/11 2235

## 2011-10-08 ENCOUNTER — Ambulatory Visit (HOSPITAL_BASED_OUTPATIENT_CLINIC_OR_DEPARTMENT_OTHER): Admission: RE | Admit: 2011-10-08 | Payer: Medicaid Other | Source: Ambulatory Visit | Admitting: Otolaryngology

## 2011-10-08 ENCOUNTER — Encounter (HOSPITAL_BASED_OUTPATIENT_CLINIC_OR_DEPARTMENT_OTHER): Payer: Self-pay | Admitting: *Deleted

## 2011-10-08 ENCOUNTER — Encounter (HOSPITAL_BASED_OUTPATIENT_CLINIC_OR_DEPARTMENT_OTHER): Admission: RE | Payer: Self-pay | Source: Ambulatory Visit

## 2011-10-08 HISTORY — DX: Anemia, unspecified: D64.9

## 2011-10-08 SURGERY — TYMPANOPLASTY
Anesthesia: General | Laterality: Right

## 2011-10-09 ENCOUNTER — Encounter (HOSPITAL_COMMUNITY): Payer: Self-pay | Admitting: *Deleted

## 2011-10-09 NOTE — Progress Notes (Signed)
I spoke with patient through interpreter [phone], Telephonic Interpreting.

## 2011-10-10 ENCOUNTER — Ambulatory Visit (HOSPITAL_COMMUNITY)
Admission: RE | Admit: 2011-10-10 | Discharge: 2011-10-11 | Disposition: A | Discharge status: Home / Self Care | Payer: Medicaid Other | Source: Ambulatory Visit | Attending: Otolaryngology | Admitting: Otolaryngology

## 2011-10-10 ENCOUNTER — Encounter (HOSPITAL_COMMUNITY): Payer: Self-pay | Admitting: *Deleted

## 2011-10-10 ENCOUNTER — Ambulatory Visit (HOSPITAL_COMMUNITY): Payer: Medicaid Other | Admitting: Certified Registered"

## 2011-10-10 ENCOUNTER — Encounter (HOSPITAL_COMMUNITY): Payer: Self-pay | Admitting: Certified Registered"

## 2011-10-10 ENCOUNTER — Encounter (HOSPITAL_COMMUNITY): Payer: Self-pay | Admitting: Emergency Medicine

## 2011-10-10 ENCOUNTER — Encounter (HOSPITAL_COMMUNITY)
Admission: RE | Disposition: A | Discharge status: Home / Self Care | Payer: Self-pay | Source: Ambulatory Visit | Attending: Otolaryngology

## 2011-10-10 DIAGNOSIS — Z23 Encounter for immunization: Secondary | ICD-10-CM | POA: Insufficient documentation

## 2011-10-10 DIAGNOSIS — H7290 Unspecified perforation of tympanic membrane, unspecified ear: Secondary | ICD-10-CM | POA: Insufficient documentation

## 2011-10-10 HISTORY — PX: TYMPANOPLASTY: SHX33

## 2011-10-10 LAB — SURGICAL PCR SCREEN: Staphylococcus aureus: NEGATIVE

## 2011-10-10 LAB — CBC
Hemoglobin: 12.2 g/dL (ref 12.0–15.0)
RBC: 3.97 MIL/uL (ref 3.87–5.11)

## 2011-10-10 SURGERY — TYMPANOPLASTY
Anesthesia: General | Site: Ear | Laterality: Right | Wound class: Clean Contaminated

## 2011-10-10 MED ORDER — PROMETHAZINE HCL 25 MG RE SUPP: 25.0000 mg | Freq: Four times a day (QID) | RECTAL | Status: AC | PRN

## 2011-10-10 MED ORDER — 0.9 % SODIUM CHLORIDE (POUR BTL) OPTIME
TOPICAL | Status: DC | PRN
  Administered 2011-10-10: 1000 mL

## 2011-10-10 MED ORDER — HYDROCODONE-ACETAMINOPHEN 5-325 MG PO TABS
ORAL_TABLET | ORAL | Status: AC
  Filled 2011-10-10: qty 2

## 2011-10-10 MED ORDER — HYDROCODONE-ACETAMINOPHEN 7.5-325 MG PO TABS
1.0000 | ORAL_TABLET | Freq: Four times a day (QID) | ORAL | Status: DC | PRN
  Administered 2011-10-11: 1 via ORAL
  Filled 2011-10-10: qty 1

## 2011-10-10 MED ORDER — CIPROFLOXACIN-DEXAMETHASONE 0.3-0.1 % OT SUSP
3.0000 [drp] | Freq: Three times a day (TID) | OTIC | Status: DC
  Administered 2011-10-11: 3 [drp] via OTIC
  Filled 2011-10-10 (×2): qty 7.5

## 2011-10-10 MED ORDER — HYDROCODONE-ACETAMINOPHEN 7.5-500 MG PO TABS: 1.0000 | ORAL_TABLET | Freq: Four times a day (QID) | ORAL | Status: AC | PRN

## 2011-10-10 MED ORDER — NEOSTIGMINE METHYLSULFATE 1 MG/ML IJ SOLN
INTRAMUSCULAR | Status: DC | PRN
  Administered 2011-10-10: 2.5 mg via INTRAVENOUS

## 2011-10-10 MED ORDER — ROCURONIUM BROMIDE 100 MG/10ML IV SOLN
INTRAVENOUS | Status: DC | PRN
  Administered 2011-10-10: 30 mg via INTRAVENOUS

## 2011-10-10 MED ORDER — LIDOCAINE HCL (CARDIAC) 20 MG/ML IV SOLN
INTRAVENOUS | Status: DC | PRN
  Administered 2011-10-10: 65 mg via INTRAVENOUS

## 2011-10-10 MED ORDER — LIDOCAINE-EPINEPHRINE 1 %-1:100000 IJ SOLN
INTRAMUSCULAR | Status: AC
  Filled 2011-10-10: qty 1

## 2011-10-10 MED ORDER — HYDROMORPHONE HCL PF 1 MG/ML IJ SOLN: 0.2500 mg | INTRAMUSCULAR | Status: DC | PRN

## 2011-10-10 MED ORDER — LACTATED RINGERS IV SOLN
INTRAVENOUS | Status: DC | PRN
  Administered 2011-10-10 (×2): via INTRAVENOUS

## 2011-10-10 MED ORDER — MUPIROCIN 2 % EX OINT
TOPICAL_OINTMENT | Freq: Once | CUTANEOUS | Status: AC
  Administered 2011-10-10: 1 via NASAL
  Filled 2011-10-10 (×2): qty 22

## 2011-10-10 MED ORDER — LACTATED RINGERS IV SOLN
INTRAVENOUS | Status: DC
  Administered 2011-10-10: 14:00:00 via INTRAVENOUS

## 2011-10-10 MED ORDER — BACITRACIN ZINC 500 UNIT/GM EX OINT
TOPICAL_OINTMENT | CUTANEOUS | Status: DC | PRN
  Administered 2011-10-10: 1 via TOPICAL

## 2011-10-10 MED ORDER — ONDANSETRON HCL 4 MG/2ML IJ SOLN
INTRAMUSCULAR | Status: DC | PRN
  Administered 2011-10-10: 4 mg via INTRAVENOUS

## 2011-10-10 MED ORDER — CIPROFLOXACIN-DEXAMETHASONE 0.3-0.1 % OT SUSP: 3.0000 [drp] | Freq: Three times a day (TID) | OTIC | Status: AC

## 2011-10-10 MED ORDER — HYDROCODONE-ACETAMINOPHEN 5-325 MG PO TABS
2.0000 | ORAL_TABLET | Freq: Once | ORAL | Status: AC
  Administered 2011-10-10: 2 via ORAL
  Filled 2011-10-10: qty 2

## 2011-10-10 MED ORDER — MIDAZOLAM HCL 5 MG/5ML IJ SOLN
INTRAMUSCULAR | Status: DC | PRN
  Administered 2011-10-10: 2 mg via INTRAVENOUS

## 2011-10-10 MED ORDER — GLYCOPYRROLATE 0.2 MG/ML IJ SOLN
INTRAMUSCULAR | Status: DC | PRN
  Administered 2011-10-10: .5 mg via INTRAVENOUS

## 2011-10-10 MED ORDER — CIPROFLOXACIN-DEXAMETHASONE 0.3-0.1 % OT SUSP
OTIC | Status: AC
  Filled 2011-10-10: qty 7.5

## 2011-10-10 MED ORDER — PROPOFOL 10 MG/ML IV BOLUS
INTRAVENOUS | Status: DC | PRN
  Administered 2011-10-10: 140 mg via INTRAVENOUS

## 2011-10-10 MED ORDER — CIPROFLOXACIN-DEXAMETHASONE 0.3-0.1 % OT SUSP
OTIC | Status: DC | PRN
  Administered 2011-10-10: 4 [drp] via OTIC

## 2011-10-10 MED ORDER — BACITRACIN ZINC 500 UNIT/GM EX OINT
TOPICAL_OINTMENT | CUTANEOUS | Status: AC
  Filled 2011-10-10: qty 15

## 2011-10-10 MED ORDER — LIDOCAINE-EPINEPHRINE 1 %-1:100000 IJ SOLN
INTRAMUSCULAR | Status: DC | PRN
  Administered 2011-10-10: 5 mL

## 2011-10-10 MED ORDER — FENTANYL CITRATE 0.05 MG/ML IJ SOLN
INTRAMUSCULAR | Status: DC | PRN
  Administered 2011-10-10: 50 ug via INTRAVENOUS
  Administered 2011-10-10: 100 ug via INTRAVENOUS

## 2011-10-10 MED ORDER — PROMETHAZINE HCL 25 MG RE SUPP: 25.0000 mg | Freq: Four times a day (QID) | RECTAL | Status: DC | PRN

## 2011-10-10 SURGICAL SUPPLY — 47 items
ATTRACTOMAT 16X20 MAGNETIC DRP (DRAPES) IMPLANT
BANDAGE CONFORM 3  STR LF (GAUZE/BANDAGES/DRESSINGS) IMPLANT
BANDAGE GAUZE 4  KLING STR (GAUZE/BANDAGES/DRESSINGS) IMPLANT
BENZOIN TINCTURE PRP APPL 2/3 (GAUZE/BANDAGES/DRESSINGS) IMPLANT
BLADE SURG 10 STRL SS (BLADE) IMPLANT
CANISTER SUCTION 2500CC (MISCELLANEOUS) ×2 IMPLANT
CLEANER TIP ELECTROSURG 2X2 (MISCELLANEOUS) ×2 IMPLANT
CLOTH BEACON ORANGE TIMEOUT ST (SAFETY) ×2 IMPLANT
COTTONBALL LRG STERILE PKG (GAUZE/BANDAGES/DRESSINGS) IMPLANT
COVER SURGICAL LIGHT HANDLE (MISCELLANEOUS) ×2 IMPLANT
DERMABOND ADVANCED (GAUZE/BANDAGES/DRESSINGS) ×1
DERMABOND ADVANCED .7 DNX12 (GAUZE/BANDAGES/DRESSINGS) ×1 IMPLANT
DRAPE EENT ADH APERT 31X51 STR (DRAPES) ×2 IMPLANT
DRAPE MICROSCOPE LEICA 54X105 (DRAPE) ×2 IMPLANT
DRESSING TELFA 8X3 (GAUZE/BANDAGES/DRESSINGS) IMPLANT
DRSG GLASSCOCK MASTOID ADT (GAUZE/BANDAGES/DRESSINGS) ×2 IMPLANT
DRSG GLASSCOCK MASTOID PED (GAUZE/BANDAGES/DRESSINGS) IMPLANT
ELECT COATED BLADE 2.86 ST (ELECTRODE) ×2 IMPLANT
ELECT REM PT RETURN 9FT ADLT (ELECTROSURGICAL) ×2
ELECTRODE REM PT RTRN 9FT ADLT (ELECTROSURGICAL) ×1 IMPLANT
GAUZE PACKING IODOFORM 1/2 (PACKING) IMPLANT
GLOVE BIOGEL PI IND STRL 6.5 (GLOVE) ×1 IMPLANT
GLOVE BIOGEL PI INDICATOR 6.5 (GLOVE) ×1
GLOVE ECLIPSE 7.5 STRL STRAW (GLOVE) ×2 IMPLANT
GLOVE ORTHO TXT STRL SZ7.5 (GLOVE) ×2 IMPLANT
GLOVE SURG SS PI 6.5 STRL IVOR (GLOVE) ×2 IMPLANT
GOWN PREVENTION PLUS XLARGE (GOWN DISPOSABLE) ×2 IMPLANT
GOWN STRL NON-REIN LRG LVL3 (GOWN DISPOSABLE) ×4 IMPLANT
HEMOSTAT SURGICEL .5X2 ABSORB (HEMOSTASIS) IMPLANT
KIT BASIN OR (CUSTOM PROCEDURE TRAY) ×2 IMPLANT
KIT ROOM TURNOVER OR (KITS) ×2 IMPLANT
MARKER SKIN DUAL TIP RULER LAB (MISCELLANEOUS) ×2 IMPLANT
NEEDLE 27GAX1X1/2 (NEEDLE) ×2 IMPLANT
NEEDLE HYPO 25GX1X1/2 BEV (NEEDLE) ×2 IMPLANT
NS IRRIG 1000ML POUR BTL (IV SOLUTION) ×2 IMPLANT
PAD ARMBOARD 7.5X6 YLW CONV (MISCELLANEOUS) ×4 IMPLANT
PENCIL FOOT CONTROL (ELECTRODE) ×2 IMPLANT
SPECIMEN JAR SMALL (MISCELLANEOUS) IMPLANT
SPONGE SURGIFOAM ABS GEL 12-7 (HEMOSTASIS) ×2 IMPLANT
STRIP CLOSURE SKIN 1/2X4 (GAUZE/BANDAGES/DRESSINGS) ×2 IMPLANT
SUT CHROMIC 4 0 RB 1X27 (SUTURE) ×2 IMPLANT
SUT VICRYL 4-0 PS2 18IN ABS (SUTURE) ×2 IMPLANT
TOWEL OR 17X24 6PK STRL BLUE (TOWEL DISPOSABLE) ×2 IMPLANT
TOWEL OR 17X26 10 PK STRL BLUE (TOWEL DISPOSABLE) ×2 IMPLANT
TRAY ENT MC OR (CUSTOM PROCEDURE TRAY) ×2 IMPLANT
WATER STERILE IRR 1000ML POUR (IV SOLUTION) IMPLANT
WIPE INSTRUMENT VISIWIPE 73X73 (MISCELLANEOUS) ×2 IMPLANT

## 2011-10-10 NOTE — Op Note (Signed)
OPERATIVE REPORT  DATE OF SURGERY: 10/10/2011  PATIENT:  Heather Savage,  21 y.o. female  PRE-OPERATIVE DIAGNOSIS:  RIGHT TYMPANIC MEMBRANE PERFORATION   POST-OPERATIVE DIAGNOSIS:  RIGHT TYMPANIC MEMBRANE PERFORATION   PROCEDURE:  Procedure(s): TYMPANOPLASTY  SURGEON:  Susy Frizzle, MD  ASSISTANTS: none   ANESTHESIA:   general  EBL:  10 ml  DRAINS: none   LOCAL MEDICATIONS USED:  1% Xylocaine with epinephrine  SPECIMEN:  No Specimen  COUNTS:  YES  PROCEDURE DETAILS: The patient was taken to the operating room and placed on the operating table in the supine position. Following induction of general endotracheal anesthesia the right ear was prepped and draped in a standard fashion. Xylocaine with epinephrine was infiltrated in 4 quadrants of the external auditory canal in the postauricular sulcus. Radial incisions were created in the ear canal at 4:00 and 8:00. A round knife was used to create a vascular strip. Postauricular incision was created using electrocautery. The graft was harvested from the loose area of her tissue lateral to the temporalis fascia. This was pressed and dried on the back table and cut to size and shape. The linea temporalis and mastoid periosteum were incised and the ear was brought forward with the vascular strip. Perkins retractor was used to keep the ear in place. A round knife was used to start elevating the tympanomeatal flap forward. The edges of the perforation were incised with a sharp pick and then removed. An annulus elevator was used to elevate off of the annulus anteriorly and inferiorly. As the flap was brought forward the middle ear was exposed in its entirety. There was dense inflammatory and fibrotic tissue covering the entire middle ear. There is no epithelium or cholesteatoma matrix identified. The drum remnant was dissected off of the lateral process of the malleus. The incus and stapes were intact and all the ossicular chain was mobile. The  chorda tympani nerve was identified and preserved. The middle ear was packed with saline soaked Gelfoam pieces. The graft was then placed into a medial position and the edges of the perforation were laid down flat up against the graft taking sure to support the graft with additional Gelfoam as needed. After the graft was in adequate position, the ear canal was packed with Ciprodex-soaked Gelfoam. The vascular strip was placed down along the ear canal bone and supported with additional Gelfoam. The postauricular incision was reapproximated with 4-0 chromic suture. The mastoid periosteum was reapproximated as well. Dermabond was used on the incision and a Glasscock dressing was applied. Patient was then awakened, extubated and transferred to recovery in stable condition.   PATIENT DISPOSITION:  PACU - hemodynamically stable.

## 2011-10-10 NOTE — Transfer of Care (Signed)
Immediate Anesthesia Transfer of Care Note  Patient: Heather Savage  Procedure(s) Performed: Procedure(s) (LRB) with comments: TYMPANOPLASTY (Right)  Patient Location: PACU  Anesthesia Type: General  Level of Consciousness: awake, alert  and oriented  Airway & Oxygen Therapy: Patient Spontanous Breathing and Patient connected to nasal cannula oxygen  Post-op Assessment: Report given to PACU RN, Post -op Vital signs reviewed and stable and Patient moving all extremities X 4  Post vital signs: Reviewed and stable  Complications: No apparent anesthesia complications

## 2011-10-10 NOTE — Preoperative (Signed)
Beta Blockers   Reason not to administer Beta Blockers:Not Applicable 

## 2011-10-10 NOTE — Interval H&P Note (Signed)
History and Physical Interval Note:  10/10/2011 2:00 PM  Heather Savage  has presented today for surgery, with the diagnosis of RIGHT TYMPANIC MEMBRANE PERFORATION   The various methods of treatment have been discussed with the patient and family. After consideration of risks, benefits and other options for treatment, the patient has consented to  Procedure(s) (LRB) with comments: TYMPANOPLASTY (Right) as a surgical intervention .  The patient's history has been reviewed, patient examined, no change in status, stable for surgery.  I have reviewed the patient's chart and labs.  Questions were answered to the patient's satisfaction.     Kolby Myung

## 2011-10-10 NOTE — Anesthesia Procedure Notes (Signed)
Procedure Name: Intubation Date/Time: 10/10/2011 3:13 PM Performed by: Sharlene Dory E Pre-anesthesia Checklist: Patient identified, Emergency Drugs available, Suction available, Patient being monitored and Timeout performed Patient Re-evaluated:Patient Re-evaluated prior to inductionOxygen Delivery Method: Circle system utilized Preoxygenation: Pre-oxygenation with 100% oxygen Intubation Type: IV induction Ventilation: Mask ventilation without difficulty Laryngoscope Size: Mac and 3 Grade View: Grade I Tube type: Oral Tube size: 7.5 mm Number of attempts: 1 Airway Equipment and Method: Stylet Placement Confirmation: ETT inserted through vocal cords under direct vision,  positive ETCO2,  CO2 detector and breath sounds checked- equal and bilateral Secured at: 21 cm Tube secured with: Tape Dental Injury: Teeth and Oropharynx as per pre-operative assessment

## 2011-10-10 NOTE — Anesthesia Preprocedure Evaluation (Addendum)
Anesthesia Evaluation  Patient identified by MRN, date of birth, ID band Patient awake    Reviewed: Allergy & Precautions, H&P , NPO status   Airway Mallampati: II      Dental   Pulmonary neg pulmonary ROS,          Cardiovascular negative cardio ROS      Neuro/Psych    GI/Hepatic Neg liver ROS, GERD-  Medicated,  Endo/Other  negative endocrine ROS  Renal/GU negative Renal ROS     Musculoskeletal   Abdominal   Peds  Hematology negative hematology ROS (+)   Anesthesia Other Findings   Reproductive/Obstetrics                         Anesthesia Physical Anesthesia Plan  ASA: I  Anesthesia Plan: General   Post-op Pain Management:    Induction: Intravenous  Airway Management Planned: Oral ETT  Additional Equipment:   Intra-op Plan:   Post-operative Plan: Extubation in OR  Informed Consent:   Plan Discussed with: CRNA, Anesthesiologist and Surgeon  Anesthesia Plan Comments:         Anesthesia Quick Evaluation

## 2011-10-10 NOTE — H&P (View-Only) (Signed)
  Assessment  . Discharge from the ears   (388.60) . 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 Surgery scheduled for Monday. On exam, there is cerumen buildup on the left that was cleaned out under the microscope. The drum is intact. On the right there is a large amount of purulence and fungal debris that was suctioned out. The middle ear mucosa is edematous. CSF powder was heavily applied. Proceed with surgery Monday unless she has worsening pain or discharge prior to then. Reason For Visit  Now having pain in her right ear. Allergies  No Known Drug Allergies. Current Meds  Omeprazole 20 MG Oral Capsule Delayed Release;; RPT. 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 PSH  Tympanoplasty Tympanoplasty 14 May 2011; left. Personal Hx  Marital History - Single Never A Smoker Never Drank Alcohol. Signature  Electronically signed by : Lesli Issa  M.D.; 10/02/2011 4:51 PM EST.  

## 2011-10-10 NOTE — OR Nursing (Signed)
Per translator per telephone/ hopsital approved....the patient states she has c/o pain in r ear (10/10) and  Her throat" feels thick and sore". Relayed to pt via interpreter, I have pain meds for her to take, her thraot is dry/irritated d/t ETT used to breathe for her in OR/ acknowledges understanding and took sips water. Too pain meds with no issues. Had interpreter inquire of her pain grading 0-10 scale as pt has been very stoic, very still on stretcher and showing little if any expression of any sort. Interpreter reviewed same, answer remained 10/10. Body language/expression remained unchanged.when queried if she is aware she is being d/c'd this evening, shes acknowledged yes and then when asked if there is someone to stay with her, stated no. Interpreter relayed she would be unable to go home if no one is there to stay with her, She is okay to stay the night.  Dr Jearld Fenton contacted for orders/rec'd.

## 2011-10-10 NOTE — Anesthesia Postprocedure Evaluation (Signed)
Anesthesia Post Note  Patient: Heather Savage  Procedure(s) Performed: Procedure(s) (LRB): TYMPANOPLASTY (Right)  Anesthesia type: general  Patient location: PACU  Post pain: Pain level controlled  Post assessment: Patient's Cardiovascular Status Stable  Last Vitals:  Filed Vitals:   10/10/11 1645  BP:   Pulse:   Temp: 36 C  Resp:     Post vital signs: Reviewed and stable  Level of consciousness: sedated  Complications: No apparent anesthesia complications

## 2011-10-11 ENCOUNTER — Encounter: Payer: Self-pay | Admitting: Family Medicine

## 2011-10-11 ENCOUNTER — Encounter (HOSPITAL_COMMUNITY): Payer: Self-pay | Admitting: Otolaryngology

## 2011-10-11 MED ORDER — INFLUENZA VIRUS VACC SPLIT PF IM SUSP
0.5000 mL | Freq: Once | INTRAMUSCULAR | Status: AC
  Administered 2011-10-11: 0.5 mL via INTRAMUSCULAR
  Filled 2011-10-11: qty 0.5

## 2011-10-11 MED ORDER — INFLUENZA VIRUS VACC SPLIT PF IM SUSP: 0.5000 mL | INTRAMUSCULAR | Status: DC

## 2011-10-11 NOTE — Progress Notes (Signed)
INTERPRETER [phone], Assisted the pt and I to complete assessment. Pt stated the pain is worse than the first procedure. She was administered prn pain med, see mar. At one point she states, she stays with her boyfriend, then later in the stated she states alone and wanted to stay 1 to 2 days. At that time patient was ask, "Did you need to speak with a Child psychotherapist. Her response was no. I explained to her that she had gtts need to administered in her ear, she stated she wanted to wait because of pain. She denies smoking and ETOH.

## 2011-10-12 NOTE — Discharge Summary (Signed)
  Physician Discharge Summary  Patient ID: Heather Savage MRN: [id number] DOB/AGE: Jun 13, 1990 21 y.o.  Admit date: 10/10/2011 Discharge date: 10/12/2011  Admission Diagnoses: TM perforation  Discharge Diagnoses:  Active Problems:  * No active hospital problems. *    Discharged Condition: good  Hospital Course: No complications. She needed to stay overnight because she had no way of getting home the same day as surgery.  Consults: None  Significant Diagnostic Studies: none  Treatments: surgery: Right tympanoplasty  Discharge Exam: Blood pressure 104/60, pulse 72, temperature 97.6 F (36.4 C), temperature source Oral, resp. rate 18, height 5' (1.524 m), weight 123 lb 14.4 oz (56.2 kg), last menstrual period 09/09/2011, SpO2 100.00%. PHYSICAL EXAM: Glasscock dressing in place.  Disposition: 01-Home or Self Care  Discharge Orders    Future Orders Please Complete By Expires   Diet - low sodium heart healthy      Diet - low sodium heart healthy      Increase activity slowly      Increase activity slowly          Medication List     As of 10/12/2011  9:29 AM    TAKE these medications         cetirizine 10 MG tablet   Commonly known as: ZYRTEC   Take 1 tablet (10 mg total) by mouth daily.      ciprofloxacin-dexamethasone otic suspension   Commonly known as: CIPRODEX   Place 3 drops into the right ear 3 (three) times daily.      HYDROcodone-acetaminophen 7.5-500 MG per tablet   Commonly known as: LORTAB   Take 1 tablet by mouth every 6 (six) hours as needed for pain.      omeprazole 20 MG capsule   Commonly known as: PRILOSEC   Take 1 capsule (20 mg total) by mouth daily.      polyethylene glycol packet   Commonly known as: MIRALAX / GLYCOLAX   Take 17 g by mouth daily.      promethazine 25 MG suppository   Commonly known as: PHENERGAN   Place 1 suppository (25 mg total) rectally every 6 (six) hours as needed for nausea.           Follow-up  Information    Follow up with Serena Colonel, MD. On 10/11/2011. (9:00 am)    Contact information:   [address], [address] [place] [address] [phone]          Signed: Serena Colonel 10/12/2011, 9:29 AM

## 2011-10-31 ENCOUNTER — Ambulatory Visit: Payer: Medicaid Other | Admitting: Family Medicine

## 2011-11-19 ENCOUNTER — Ambulatory Visit: Payer: Medicaid Other | Admitting: Family Medicine

## 2011-11-20 ENCOUNTER — Encounter: Payer: Self-pay | Admitting: Family Medicine

## 2011-11-20 ENCOUNTER — Ambulatory Visit (INDEPENDENT_AMBULATORY_CARE_PROVIDER_SITE_OTHER): Payer: Medicaid Other | Admitting: Family Medicine

## 2011-11-20 VITALS — BP 104/62 | HR 65 | Temp 97.8°F | Ht 60.0 in | Wt 121.6 lb

## 2011-11-20 DIAGNOSIS — N912 Amenorrhea, unspecified: Secondary | ICD-10-CM

## 2011-11-20 DIAGNOSIS — N926 Irregular menstruation, unspecified: Secondary | ICD-10-CM

## 2011-11-20 NOTE — Progress Notes (Signed)
Patient ID: Heather Savage, female   DOB: 1990-12-24, 21 y.o.   MRN: [id number] Subjective: The patient is a 21 y.o. year old female who presents today for two issues.  1. Amenorrhea: Since August 11 no periods.  No bleeding or spotting since then.  Prior to then her periods had been slightly irregular.  Menarche was around 8 or 17. Patient is from [place] and has been here for about 7 months.  Never been pregnant.  Upreg negative today.  No abd pain.  2: Right leg pain: has been a problem for about two months.  Now has some problems with pain in leg with walking.  Pain is located in anterior portion of right thigh.  No swelling, redness, fevers, chills, or injury.  Patient's past medical, social, and family history were reviewed and updated as appropriate. History  Substance Use Topics  . Smoking status: Never Smoker   . Smokeless tobacco: Never Used  . Alcohol Use: No   Objective:  Filed Vitals:   11/20/11 1613  BP: 104/62  Pulse: 65  Temp: 97.8 F (36.6 C)   Gen: NAD Abd: SNTND, no organomegaly Ext: Right leg has no abnormalities.  Area that is hurting may have a small amount of additional swelling but no skin changes, redness, pain on palpation, or masses  Assessment/Plan: Right leg pain: Likely MSK in origin.  Has not had any treatment.  Will try OTC motrin on scheduled basis for 1 week with rtc if not better.  Please also see individual problems in problem list for problem-specific plans.

## 2011-11-20 NOTE — Patient Instructions (Addendum)
Motrin (ibuprofen) 400mg  three times daily with meals for the next 2 weeks. We will check your thyroid to make certain you are not having problems with it. We will start you on 2 months of birth control pills to make certain that your uterus is not the reason you are not having periods. Come back in a week or two for a pap smear.

## 2011-11-21 ENCOUNTER — Encounter: Payer: Self-pay | Admitting: Family Medicine

## 2011-11-21 ENCOUNTER — Other Ambulatory Visit: Payer: Medicaid Other

## 2011-11-21 DIAGNOSIS — N912 Amenorrhea, unspecified: Secondary | ICD-10-CM

## 2011-11-21 MED ORDER — NORGESTIMATE-ETH ESTRADIOL 0.25-35 MG-MCG PO TABS: 1.0000 | ORAL_TABLET | Freq: Every day | ORAL | Status: DC

## 2011-11-21 NOTE — Progress Notes (Signed)
TSH DONE TODAY Heather Savage 

## 2011-11-22 LAB — TSH: TSH: 0.548 u[IU]/mL (ref 0.350–4.500)

## 2011-11-23 ENCOUNTER — Encounter: Payer: Self-pay | Admitting: Family Medicine

## 2011-11-28 DIAGNOSIS — N926 Irregular menstruation, unspecified: Secondary | ICD-10-CM | POA: Insufficient documentation

## 2011-11-28 NOTE — Assessment & Plan Note (Signed)
21 year old female with somewhat late menarchy who presents with irregular bleeding within one year of pregnancy.  Has had some problems with irregular bleeding in the past.  Will try two month of OCPs to establish cycles.  Would only pursue further workup if continued problems at that time.  Do feel that PCOS is relatively unlikely due to normal body habitus.  Due to some problems with interpretation am unable to determine how much stress there is at home as this is the most likely cause of irregular periods in a new mother with a 30 month old at home.

## 2011-11-29 ENCOUNTER — Other Ambulatory Visit: Payer: Self-pay | Admitting: *Deleted

## 2011-11-29 MED ORDER — NORGESTIMATE-ETH ESTRADIOL 0.25-35 MG-MCG PO TABS: 1.0000 | ORAL_TABLET | Freq: Every day | ORAL | Status: DC

## 2011-12-11 ENCOUNTER — Other Ambulatory Visit (HOSPITAL_COMMUNITY)
Admission: RE | Admit: 2011-12-11 | Discharge: 2011-12-11 | Disposition: A | Discharge status: Home / Self Care | Payer: Self-pay | Source: Ambulatory Visit | Attending: Family Medicine | Admitting: Family Medicine

## 2011-12-11 ENCOUNTER — Encounter: Payer: Self-pay | Admitting: Family Medicine

## 2011-12-11 ENCOUNTER — Ambulatory Visit (INDEPENDENT_AMBULATORY_CARE_PROVIDER_SITE_OTHER): Payer: Self-pay | Admitting: Family Medicine

## 2011-12-11 VITALS — BP 113/75 | HR 67 | Temp 97.9°F | Ht 60.0 in | Wt 119.6 lb

## 2011-12-11 DIAGNOSIS — N912 Amenorrhea, unspecified: Secondary | ICD-10-CM

## 2011-12-11 DIAGNOSIS — R4589 Other symptoms and signs involving emotional state: Secondary | ICD-10-CM

## 2011-12-11 DIAGNOSIS — Z113 Encounter for screening for infections with a predominantly sexual mode of transmission: Secondary | ICD-10-CM | POA: Insufficient documentation

## 2011-12-11 DIAGNOSIS — N926 Irregular menstruation, unspecified: Secondary | ICD-10-CM

## 2011-12-11 DIAGNOSIS — Z124 Encounter for screening for malignant neoplasm of cervix: Secondary | ICD-10-CM

## 2011-12-11 DIAGNOSIS — F341 Dysthymic disorder: Secondary | ICD-10-CM

## 2011-12-11 DIAGNOSIS — N760 Acute vaginitis: Secondary | ICD-10-CM

## 2011-12-11 LAB — POCT URINE PREGNANCY: Preg Test, Ur: NEGATIVE

## 2011-12-11 NOTE — Patient Instructions (Addendum)
Continue taking birth control pills  If no period when you are done with second pack, come back for further evaluation

## 2011-12-11 NOTE — Assessment & Plan Note (Signed)
No immediate danger.  Declines need for further intervention.  Will continue to monitor.  Complicated by confidentiality with interpreters, language barrier, and new immigrant.  Does has immigration support services [employer] data [employer])  Gave info for financial assistance so she can continue to come to our clinic.

## 2011-12-11 NOTE — Assessment & Plan Note (Signed)
Upreg, tsh normal.  No weight change. BMI normal.  I suspect no underlying medical cause.  Under stress as well as reports the other time this happened in her life was when she first emigrated.    Advised to finish ocp.  If no cycle by end of second pack, will return for further evaluation.

## 2011-12-11 NOTE — Progress Notes (Signed)
  Subjective:    Patient ID: Heather Savage, female    DOB: [birth year], 21 y.o.   MRN: [id number]  HPIsen today with interpreter  Here for follow-up of amenorrhea  Started OCP 10 days ago.  Still no menses since aug 11.  Had reported usually regular menses.  Did have time when did not have menses for a few months previously.  NO breast tenderness, nipple discharge, abdominal pain.  upreg and TSH normal 1-2 weeks ago.  Reports lives alone.  Reports some sadness- alluded to thinking about prior trauma,.  Reports feeling safe in her current environment.  Does feel she has support people to talk to, but states she does not feel like talking to anyone and would prefer to handle it herself.  Declines referral for counseling.  Review of Systemssee HPI     Objective:   Physical Exam GEN: Alert & Oriented, No acute distress Breast: wnl Pelvic Exam:        External: normal female genitalia without lesions or masses        Vagina: normal without lesions or masses        Cervix: normal without lesions or masses        Adnexa: normal bimanual exam without masses or fullness        Uterus: normal by palpation        Pap smear: performed        Samples for Wet prep, GC/Chlamydia obtained        Assessment & Plan:

## 2011-12-19 ENCOUNTER — Encounter: Payer: Medicaid Other | Admitting: Advanced Practice Midwife

## 2012-05-29 DIAGNOSIS — H7290 Unspecified perforation of tympanic membrane, unspecified ear: Secondary | ICD-10-CM

## 2012-05-29 HISTORY — DX: Unspecified perforation of tympanic membrane, unspecified ear: H72.90

## 2012-06-24 ENCOUNTER — Encounter (HOSPITAL_BASED_OUTPATIENT_CLINIC_OR_DEPARTMENT_OTHER): Payer: Self-pay | Admitting: *Deleted

## 2012-06-29 NOTE — H&P (Signed)
Heather Savage is an 22 y.o. female.   Chief Complaint: Right TM perf HPI: Right TM perf with chronic drainage, failed tympanoplasty  Past Medical History  Diagnosis Date  . Hearing loss     right  . 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 Anemia     states has low blood count, no current meds.  . History of gastritis     no current med.  Marland Kitchen History of malaria 2012    Past Surgical History  Procedure Laterality Date  . Tympanoplasty  05/14/2011    Procedure: TYMPANOPLASTY;  Surgeon: Serena Colonel, MD;  Location: [facility];  Service: ENT;  Laterality: Left;  TYMPANOPLASYT AND  POSSIBLE OSSICULARPLASTY   . Scar revision  08/27/2011    Procedure: SCAR REVISION;  Surgeon: Louisa Second, MD;  Location: [facility];  Service: Plastics;  Laterality: N/A;  scar revision of forehead  . Tympanoplasty  10/10/2011    Procedure: TYMPANOPLASTY;  Surgeon: Serena Colonel, MD;  Location: [facility];  Service: ENT;  Laterality: Right;    History reviewed. No pertinent family history. Social History:  reports that she has never smoked. She has never used smokeless tobacco. She reports that she does not drink alcohol or use illicit drugs.  Allergies: No Known Allergies  No prescriptions prior to admission    No results found for this or any previous visit (from the past 48 hour(s)). No results found.  ROS: otherwise negative  Weight 110 lb 3.7 oz (50.001 kg), last menstrual period 05/27/2012.  PHYSICAL EXAM: Overall appearance:  Healthy appearing, in no distress Head:  Normocephalic, atraumatic. Ears: External auditory canals are clear; tympanic membrane intact on l;eft, perforation with drainage on right. Nose: External nose is healthy in appearance. Internal nasal exam free of any lesions or obstruction. Oral Cavity/pharynx:  There are no mucosal lesions or masses identified. Neuro:  No identifiable neurologic deficits. Neck: No palpable neck  masses.  Studies Reviewed: none    Assessment/Plan Right tympanoplasty  Heather Savage 06/29/2012, 8:38 PM

## 2012-06-30 ENCOUNTER — Ambulatory Visit (HOSPITAL_BASED_OUTPATIENT_CLINIC_OR_DEPARTMENT_OTHER)
Admission: RE | Admit: 2012-06-30 | Discharge: 2012-06-30 | Disposition: A | Discharge status: Home / Self Care | Payer: BC Managed Care – PPO | Source: Ambulatory Visit | Attending: Otolaryngology | Admitting: Otolaryngology

## 2012-06-30 ENCOUNTER — Ambulatory Visit (HOSPITAL_BASED_OUTPATIENT_CLINIC_OR_DEPARTMENT_OTHER): Payer: BC Managed Care – PPO | Admitting: Anesthesiology

## 2012-06-30 ENCOUNTER — Encounter (HOSPITAL_BASED_OUTPATIENT_CLINIC_OR_DEPARTMENT_OTHER)
Admission: RE | Disposition: A | Discharge status: Home / Self Care | Payer: Self-pay | Source: Ambulatory Visit | Attending: Otolaryngology

## 2012-06-30 ENCOUNTER — Encounter (HOSPITAL_BASED_OUTPATIENT_CLINIC_OR_DEPARTMENT_OTHER): Payer: Self-pay

## 2012-06-30 ENCOUNTER — Encounter (HOSPITAL_BASED_OUTPATIENT_CLINIC_OR_DEPARTMENT_OTHER): Payer: Self-pay | Admitting: Anesthesiology

## 2012-06-30 DIAGNOSIS — D649 Anemia, unspecified: Secondary | ICD-10-CM | POA: Insufficient documentation

## 2012-06-30 DIAGNOSIS — H7201 Central perforation of tympanic membrane, right ear: Secondary | ICD-10-CM

## 2012-06-30 DIAGNOSIS — H7290 Unspecified perforation of tympanic membrane, unspecified ear: Secondary | ICD-10-CM | POA: Insufficient documentation

## 2012-06-30 DIAGNOSIS — H9190 Unspecified hearing loss, unspecified ear: Secondary | ICD-10-CM | POA: Insufficient documentation

## 2012-06-30 DIAGNOSIS — Z8719 Personal history of other diseases of the digestive system: Secondary | ICD-10-CM | POA: Insufficient documentation

## 2012-06-30 DIAGNOSIS — Z8613 Personal history of malaria: Secondary | ICD-10-CM | POA: Insufficient documentation

## 2012-06-30 HISTORY — DX: Personal history of other diseases of the digestive system: Z87.19

## 2012-06-30 HISTORY — DX: Personal history of malaria: Z86.13

## 2012-06-30 HISTORY — PX: TYMPANOPLASTY: SHX33

## 2012-06-30 SURGERY — TYMPANOPLASTY
Anesthesia: General | Site: Ear | Laterality: Right | Wound class: Clean

## 2012-06-30 MED ORDER — MIDAZOLAM HCL 2 MG/2ML IJ SOLN: 1.0000 mg | INTRAMUSCULAR | Status: DC | PRN

## 2012-06-30 MED ORDER — ONDANSETRON HCL 4 MG/2ML IJ SOLN
INTRAMUSCULAR | Status: DC | PRN
  Administered 2012-06-30: 4 mg via INTRAVENOUS

## 2012-06-30 MED ORDER — PROPOFOL 10 MG/ML IV BOLUS
INTRAVENOUS | Status: DC | PRN
  Administered 2012-06-30: 120 mg via INTRAVENOUS

## 2012-06-30 MED ORDER — FENTANYL CITRATE 0.05 MG/ML IJ SOLN
INTRAMUSCULAR | Status: DC | PRN
  Administered 2012-06-30: 100 ug via INTRAVENOUS

## 2012-06-30 MED ORDER — OXYCODONE HCL 5 MG PO TABS: 5.0000 mg | ORAL_TABLET | Freq: Once | ORAL | Status: DC | PRN

## 2012-06-30 MED ORDER — CIPROFLOXACIN-DEXAMETHASONE 0.3-0.1 % OT SUSP: 3.0000 [drp] | Freq: Three times a day (TID) | OTIC | Status: DC

## 2012-06-30 MED ORDER — DEXAMETHASONE SODIUM PHOSPHATE 4 MG/ML IJ SOLN
INTRAMUSCULAR | Status: DC | PRN
  Administered 2012-06-30: 10 mg via INTRAVENOUS

## 2012-06-30 MED ORDER — OXYCODONE HCL 5 MG/5ML PO SOLN: 5.0000 mg | Freq: Once | ORAL | Status: DC | PRN

## 2012-06-30 MED ORDER — SUCCINYLCHOLINE CHLORIDE 20 MG/ML IJ SOLN
INTRAMUSCULAR | Status: DC | PRN
  Administered 2012-06-30: 80 mg via INTRAVENOUS

## 2012-06-30 MED ORDER — HYDROCODONE-ACETAMINOPHEN 7.5-325 MG PO TABS: 1.0000 | ORAL_TABLET | Freq: Four times a day (QID) | ORAL | Status: DC | PRN

## 2012-06-30 MED ORDER — LACTATED RINGERS IV SOLN
INTRAVENOUS | Status: DC
  Administered 2012-06-30 (×2): via INTRAVENOUS

## 2012-06-30 MED ORDER — FENTANYL CITRATE 0.05 MG/ML IJ SOLN: 50.0000 ug | INTRAMUSCULAR | Status: DC | PRN

## 2012-06-30 MED ORDER — HYDROMORPHONE HCL PF 1 MG/ML IJ SOLN
0.2500 mg | INTRAMUSCULAR | Status: DC | PRN
  Administered 2012-06-30: 0.5 mg via INTRAVENOUS
  Administered 2012-06-30 (×2): 0.25 mg via INTRAVENOUS

## 2012-06-30 MED ORDER — CIPROFLOXACIN-DEXAMETHASONE 0.3-0.1 % OT SUSP
OTIC | Status: DC | PRN
  Administered 2012-06-30: 4 [drp] via OTIC

## 2012-06-30 MED ORDER — MIDAZOLAM HCL 2 MG/ML PO SYRP: 12.0000 mg | ORAL_SOLUTION | Freq: Once | ORAL | Status: DC | PRN

## 2012-06-30 MED ORDER — MIDAZOLAM HCL 5 MG/5ML IJ SOLN
INTRAMUSCULAR | Status: DC | PRN
  Administered 2012-06-30: 2 mg via INTRAVENOUS

## 2012-06-30 MED ORDER — LIDOCAINE HCL (CARDIAC) 10 MG/ML IV SOLN
INTRAVENOUS | Status: DC | PRN
  Administered 2012-06-30: 50 mg via INTRAVENOUS

## 2012-06-30 MED ORDER — BACITRACIN ZINC 500 UNIT/GM EX OINT
TOPICAL_OINTMENT | CUTANEOUS | Status: DC | PRN
  Administered 2012-06-30: 1 via TOPICAL

## 2012-06-30 MED ORDER — LIDOCAINE-EPINEPHRINE 1 %-1:100000 IJ SOLN
INTRAMUSCULAR | Status: DC | PRN
  Administered 2012-06-30: 5 mL

## 2012-06-30 MED ORDER — PROMETHAZINE HCL 25 MG RE SUPP: 25.0000 mg | Freq: Four times a day (QID) | RECTAL | Status: DC | PRN

## 2012-06-30 SURGICAL SUPPLY — 74 items
BANDAGE GAUZE 4  KLING STR (GAUZE/BANDAGES/DRESSINGS) IMPLANT
BANDAGE GAUZE ELAST BULKY 4 IN (GAUZE/BANDAGES/DRESSINGS) IMPLANT
BENZOIN TINCTURE PRP APPL 2/3 (GAUZE/BANDAGES/DRESSINGS) IMPLANT
BIT DRILL LEGEND 0.5MM 70MM (BIT) IMPLANT
BIT DRILL LEGEND 1.0MM 70MM (BIT) IMPLANT
BIT DRILL LEGEND 4.0MM 70MM (BIT) IMPLANT
BLADE NEEDLE 3 SS STRL (BLADE) IMPLANT
BLADE SURG ROTATE 9660 (MISCELLANEOUS) IMPLANT
CANISTER SUCTION 1200CC (MISCELLANEOUS) ×2 IMPLANT
CLEANER CAUTERY TIP 5X5 PAD (MISCELLANEOUS) ×1 IMPLANT
CLOTH BEACON ORANGE TIMEOUT ST (SAFETY) ×2 IMPLANT
COTTONBALL LRG STERILE PKG (GAUZE/BANDAGES/DRESSINGS) ×2 IMPLANT
DECANTER SPIKE VIAL GLASS SM (MISCELLANEOUS) ×2 IMPLANT
DERMABOND ADVANCED (GAUZE/BANDAGES/DRESSINGS) ×1
DERMABOND ADVANCED .7 DNX12 (GAUZE/BANDAGES/DRESSINGS) ×1 IMPLANT
DRAPE EENT ADH APERT 31X51 STR (DRAPES) ×2 IMPLANT
DRAPE INCISE 23X17 IOBAN STRL (DRAPES)
DRAPE INCISE IOBAN 23X17 STRL (DRAPES) IMPLANT
DRAPE MICROSCOPE URBAN (DRAPES) ×2 IMPLANT
DRAPE MICROSCOPE WILD 40.5X102 (DRAPES) IMPLANT
DRESSING TELFA 8X3 (GAUZE/BANDAGES/DRESSINGS) IMPLANT
DRILL BIT LEGEND (BIT) IMPLANT
DRILL BIT LEGEND 7BA20-MN (BIT) IMPLANT
DRILL BIT LEGEND 7BA25-MN (BIT) IMPLANT
DRILL BIT LEGEND 7BA30-MN (BIT) IMPLANT
DRILL BIT LEGEND 7BA30D-MN (BIT) IMPLANT
DRILL BIT LEGEND 7BA30DL-MN (BIT) IMPLANT
DRILL BIT LEGEND 7BA30L-MN (BIT) IMPLANT
DRILL BIT LEGEND 7BA40-MN (BIT) IMPLANT
DRILL BIT LEGEND 7BA40D-MN (BIT) IMPLANT
DRILL BIT LEGEND 7BA50-MN (BIT) IMPLANT
DRILL BIT LEGEND 7BA50D-MN (BIT) IMPLANT
DRILL BIT LEGEND 7BA60-MN (BIT) IMPLANT
DRILL BIT LEGEND 7BA70-MN (BIT) IMPLANT
DROPPER MEDICINE STER 1.5ML LF (MISCELLANEOUS) IMPLANT
DRSG GLASSCOCK MASTOID ADT (GAUZE/BANDAGES/DRESSINGS) ×2 IMPLANT
DRSG GLASSCOCK MASTOID PED (GAUZE/BANDAGES/DRESSINGS) IMPLANT
ELECT COATED BLADE 2.86 ST (ELECTRODE) ×2 IMPLANT
ELECT REM PT RETURN 9FT ADLT (ELECTROSURGICAL) ×2
ELECTRODE REM PT RTRN 9FT ADLT (ELECTROSURGICAL) ×1 IMPLANT
GAUZE SPONGE 4X4 12PLY STRL LF (GAUZE/BANDAGES/DRESSINGS) IMPLANT
GAUZE SPONGE 4X4 16PLY XRAY LF (GAUZE/BANDAGES/DRESSINGS) IMPLANT
GLOVE BIO SURGEON STRL SZ7 (GLOVE) ×2 IMPLANT
GLOVE ECLIPSE 6.5 STRL STRAW (GLOVE) ×2 IMPLANT
GLOVE ECLIPSE 7.5 STRL STRAW (GLOVE) ×2 IMPLANT
GOWN PREVENTION PLUS XLARGE (GOWN DISPOSABLE) ×4 IMPLANT
GOWN PREVENTION PLUS XXLARGE (GOWN DISPOSABLE) IMPLANT
IV CATH AUTO 14GX1.75 SAFE ORG (IV SOLUTION) IMPLANT
NDL SAFETY ECLIPSE 18X1.5 (NEEDLE) ×1 IMPLANT
NEEDLE 27GAX1X1/2 (NEEDLE) ×2 IMPLANT
NEEDLE HYPO 18GX1.5 SHARP (NEEDLE) ×1
NS IRRIG 1000ML POUR BTL (IV SOLUTION) ×2 IMPLANT
PACK BASIN DAY SURGERY FS (CUSTOM PROCEDURE TRAY) ×2 IMPLANT
PACK ENT DAY SURGERY (CUSTOM PROCEDURE TRAY) ×2 IMPLANT
PAD CLEANER CAUTERY TIP 5X5 (MISCELLANEOUS) ×1
PENCIL FOOT CONTROL (ELECTRODE) ×2 IMPLANT
SET EXT MALE ROTATING LL 32IN (MISCELLANEOUS) ×2 IMPLANT
SHEET MEDIUM DRAPE 40X70 STRL (DRAPES) IMPLANT
SLEEVE SCD COMPRESS KNEE MED (MISCELLANEOUS) IMPLANT
SPONGE GAUZE 4X4 12PLY (GAUZE/BANDAGES/DRESSINGS) IMPLANT
SPONGE SURGIFOAM ABS GEL 12-7 (HEMOSTASIS) IMPLANT
STRIP CLOSURE SKIN 1/2X4 (GAUZE/BANDAGES/DRESSINGS) IMPLANT
SUT CHROMIC 3 0 PS 2 (SUTURE) ×2 IMPLANT
SUT CHROMIC 4 0 P 3 18 (SUTURE) IMPLANT
SUT CHROMIC 4 0 PS 2 18 (SUTURE) IMPLANT
SUT ETHILON 5 0 P 3 18 (SUTURE)
SUT NYLON ETHILON 5-0 P-3 1X18 (SUTURE) IMPLANT
SUT PLAIN 5 0 P 3 18 (SUTURE) IMPLANT
SUT VIC AB 3-0 FS2 27 (SUTURE) IMPLANT
SYR 5ML LL (SYRINGE) IMPLANT
SYR BULB 3OZ (MISCELLANEOUS) IMPLANT
TOWEL OR 17X24 6PK STRL BLUE (TOWEL DISPOSABLE) ×2 IMPLANT
TRAY DSU PREP LF (CUSTOM PROCEDURE TRAY) ×2 IMPLANT
TUBING IRRIGATION STER IRD100 (TUBING) IMPLANT

## 2012-06-30 NOTE — Anesthesia Procedure Notes (Signed)
Procedure Name: Intubation Date/Time: 06/30/2012 8:55 AM Performed by: Gar Gibbon Pre-anesthesia Checklist: Patient identified, Emergency Drugs available, Suction available and Patient being monitored Patient Re-evaluated:Patient Re-evaluated prior to inductionOxygen Delivery Method: Circle System Utilized Preoxygenation: Pre-oxygenation with 100% oxygen Intubation Type: IV induction Ventilation: Mask ventilation without difficulty Laryngoscope Size: Mac and 3 Grade View: Grade I Tube type: Oral Tube size: 7.0 mm Number of attempts: 1 Airway Equipment and Method: stylet and oral airway Placement Confirmation: ETT inserted through vocal cords under direct vision,  positive ETCO2 and breath sounds checked- equal and bilateral Tube secured with: Tape Dental Injury: Teeth and Oropharynx as per pre-operative assessment

## 2012-06-30 NOTE — Interval H&P Note (Signed)
History and Physical Interval Note:  06/30/2012 8:07 AM  Heather Savage  has presented today for surgery, with the diagnosis of Right Tympanic Membrane Perforation  The various methods of treatment have been discussed with the patient and family. After consideration of risks, benefits and other options for treatment, the patient has consented to  Procedure(s): REVISION RIGHT TYMPANOPLASTY (Right) as a surgical intervention .  The patient's history has been reviewed, patient examined, no change in status, stable for surgery.  I have reviewed the patient's chart and labs.  Questions were answered to the patient's satisfaction.     [name]

## 2012-06-30 NOTE — Transfer of Care (Signed)
Immediate Anesthesia Transfer of Care Note  Patient: Heather Savage  Procedure(s) Performed: Procedure(s): REVISION RIGHT TYMPANOPLASTY (Right)  Patient Location: PACU  Anesthesia Type:General  Level of Consciousness: sedated and patient cooperative  Airway & Oxygen Therapy: Patient Spontanous Breathing and Patient connected to face mask oxygen  Post-op Assessment: Report given to PACU RN and Post -op Vital signs reviewed and stable  Post vital signs: Reviewed and stable  Complications: No apparent anesthesia complications

## 2012-06-30 NOTE — Anesthesia Postprocedure Evaluation (Signed)
Anesthesia Post Note  Patient: Heather Savage  Procedure(s) Performed: Procedure(s) (LRB): REVISION RIGHT TYMPANOPLASTY (Right)  Anesthesia type: General  Patient location: PACU  Post pain: Pain level controlled  Post assessment: Patient's Cardiovascular Status Stable  Last Vitals:  Filed Vitals:   06/30/12 1230  BP: 119/73  Pulse: 66  Temp:   Resp: 17    Post vital signs: Reviewed and stable  Level of consciousness: alert  Complications: No apparent anesthesia complications

## 2012-06-30 NOTE — Anesthesia Preprocedure Evaluation (Addendum)
Anesthesia Evaluation  Patient identified by MRN, date of birth, ID band Patient awake    Reviewed: Allergy & Precautions, H&P , NPO status , Patient's Chart, lab work & pertinent test results  Airway Mallampati: II TM Distance: >3 FB Neck ROM: Full    Dental no notable dental hx. (+) Teeth Intact and Dental Advisory Given   Pulmonary neg pulmonary ROS,  breath sounds clear to auscultation  Pulmonary exam normal       Cardiovascular negative cardio ROS  Rhythm:Regular Rate:Normal     Neuro/Psych negative neurological ROS  negative psych ROS   GI/Hepatic negative GI ROS, Neg liver ROS,   Endo/Other  negative endocrine ROS  Renal/GU negative Renal ROS  negative genitourinary   Musculoskeletal   Abdominal   Peds  Hematology negative hematology ROS (+)   Anesthesia Other Findings   Reproductive/Obstetrics negative OB ROS                           Anesthesia Physical Anesthesia Plan  ASA: I  Anesthesia Plan: General   Post-op Pain Management:    Induction: Intravenous  Airway Management Planned: Oral ETT  Additional Equipment:   Intra-op Plan:   Post-operative Plan: Extubation in OR  Informed Consent: I have reviewed the patients History and Physical, chart, labs and discussed the procedure including the risks, benefits and alternatives for the proposed anesthesia with the patient or authorized representative who has indicated his/her understanding and acceptance.   Dental advisory given  Plan Discussed with: CRNA  Anesthesia Plan Comments:         Anesthesia Quick Evaluation  

## 2012-06-30 NOTE — Op Note (Signed)
OPERATIVE REPORT  DATE OF SURGERY: 06/30/2012  PATIENT:  Heather Savage,  22 y.o. female  PRE-OPERATIVE DIAGNOSIS:  Right Tympanic Membrane Perforation  POST-OPERATIVE DIAGNOSIS:  Right Tympanic Membrane Perforation  PROCEDURE:  Procedure(s): REVISION RIGHT TYMPANOPLASTY  SURGEON:  Susy Frizzle, MD  ASSISTANTS: none  ANESTHESIA:   General   EBL:  20 ml  DRAINS: none  LOCAL MEDICATIONS USED:  1% Xylocaine with epinephrine  SPECIMEN:  none  COUNTS:  Correct  PROCEDURE DETAILS: The patient was taken to the operating room and placed on the operating table in the supine position. Following induction of general endotracheal anesthesia, the right ear was prepped and draped in a standard fashion. 1% Xylocaine with epinephrine was infiltrated into 4 quadrants of the external auditory canal and the postauricular sulcus where the previous scar was. Radial incisions were created in the ear canal at 4:00 and 8:00 using a sickle knife. A round knife was used to create a vascular strip which was elevated off the ear canal bone. The postauricular incision was then created using electrocautery. Temporalis fascia was harvested, pressed and dried on the back table. The linea temporalis and mastoid periosteum were incised and dissected off of the mastoid bone and brought forward with the external ear. A Perkins retractor was used throughout the case. The perforation was inspected. Edges were curled down into the middle ear the middle ear did not appear to have any epithelium. The perforation was freshened using a sharp pick and cup forceps. The temporal meatal flap was brought forward, the chorda tympani nerve was identified and preserved. There was dense reactive tissue throughout the middle ear. The ossicular chain was completely encased in the same dense type scar tissue. After the flap was elevated, the perforation was basically felt to be a total perforation. The annulus epithelium was elevated off  the bone in all directions. The soft tissue encasing the manubrium of the malleus was dissected off using a sharp pick. The incus and stapes superstructure were not completely identified as they were also encased in a similar type tissue. The middle ear was packed with Ciprodex-soaked Gelfoam including the eustachian tube orifice. The graft was trimmed and notched for the manubrium and then placed into a medial position. Additional packing was used in the middle ear to support the graft and to make sure that the native epithelial edges were lying nicely on top of the graft. The temporal Mayo flap was returned to its normal position and also laid down nicely on the graft. The ear canal was then packed with Ciprodex-soaked Gelfoam. The postauricular closure was accomplished using 3-0 chromic suture first reapproximating the periosteum and then a subcuticular incisional and closure was done. Dermabond was used on the skin. Additional packing was placed at the extra meatus making sure that the vascular strip was down on the ear canal bone. Mastoid dressing was applied and the patient was awakened, extubated and transferred to recovery in stable condition.    PATIENT DISPOSITION:  To PACU, stable

## 2012-07-01 ENCOUNTER — Encounter (HOSPITAL_BASED_OUTPATIENT_CLINIC_OR_DEPARTMENT_OTHER): Payer: Self-pay | Admitting: Otolaryngology

## 2012-07-03 ENCOUNTER — Encounter (HOSPITAL_COMMUNITY): Payer: Self-pay | Admitting: Emergency Medicine

## 2012-07-03 ENCOUNTER — Encounter (HOSPITAL_COMMUNITY): Payer: Self-pay | Admitting: *Deleted

## 2012-07-03 ENCOUNTER — Emergency Department (HOSPITAL_COMMUNITY)
Admission: EM | Admit: 2012-07-03 | Discharge: 2012-07-03 | Disposition: A | Discharge status: Nursing Home (Includes ICF) | Payer: BC Managed Care – PPO | Source: Home / Self Care | Attending: Emergency Medicine | Admitting: Emergency Medicine

## 2012-07-03 DIAGNOSIS — Z8669 Personal history of other diseases of the nervous system and sense organs: Secondary | ICD-10-CM | POA: Insufficient documentation

## 2012-07-03 DIAGNOSIS — H60399 Other infective otitis externa, unspecified ear: Secondary | ICD-10-CM | POA: Insufficient documentation

## 2012-07-03 DIAGNOSIS — H9209 Otalgia, unspecified ear: Secondary | ICD-10-CM

## 2012-07-03 DIAGNOSIS — Z792 Long term (current) use of antibiotics: Secondary | ICD-10-CM | POA: Insufficient documentation

## 2012-07-03 DIAGNOSIS — H9210 Otorrhea, unspecified ear: Secondary | ICD-10-CM | POA: Insufficient documentation

## 2012-07-03 DIAGNOSIS — Z8719 Personal history of other diseases of the digestive system: Secondary | ICD-10-CM | POA: Insufficient documentation

## 2012-07-03 DIAGNOSIS — H9201 Otalgia, right ear: Secondary | ICD-10-CM

## 2012-07-03 DIAGNOSIS — Z9889 Other specified postprocedural states: Secondary | ICD-10-CM | POA: Insufficient documentation

## 2012-07-03 DIAGNOSIS — Z862 Personal history of diseases of the blood and blood-forming organs and certain disorders involving the immune mechanism: Secondary | ICD-10-CM | POA: Insufficient documentation

## 2012-07-03 DIAGNOSIS — Z8619 Personal history of other infectious and parasitic diseases: Secondary | ICD-10-CM | POA: Insufficient documentation

## 2012-07-03 NOTE — ED Notes (Addendum)
Pt states that she had surgery on her right ear this past Monday. Pt states that the area has been increased in drainage and bleeding today. Pt also complaining of dizziness as well. Pt has right ear pain as well. Pt sent from urgent care for further work up to rule out post op infection.

## 2012-07-03 NOTE — ED Provider Notes (Signed)
History     CSN: [id number]  Arrival date & time 07/03/12  1716   First MD Initiated Contact with Patient 07/03/12 1912      Chief Complaint  Patient presents with  . Otalgia    right ear pain since monday. pt previously seen on 6/2 for same problem    (Consider location/radiation/quality/duration/timing/severity/associated sxs/prior treatment) HPI Comments: All information obtained via translator who is with pt today.  Pt presents c/o right-sided ear pain and bleeding.  She is POD3 from middle ear surgery for TM perforation.  Since then, she has been having constant bloody drainage and gradually increasing pain and pressure in and around the right ear, particularly behind the ear.  She also states she cannot hear out of the ear.  She called the surgeon and has an appt for tomorrow but they decided to come in because the bleeding was not slowing down.  She also admits to feeling feverish and having a headache.    Patient is a 22 y.o. female presenting with ear pain.  Otalgia Associated symptoms: ear discharge, fever, hearing loss and sore throat   Associated symptoms: no abdominal pain, no cough, no neck pain, no rash, no tinnitus and no vomiting     Past Medical History  Diagnosis Date  . Hearing loss     right  . 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 Anemia     states has low blood count, no current meds.  . History of gastritis     no current med.  Marland Kitchen History of malaria 2012    Past Surgical History  Procedure Laterality Date  . Tympanoplasty  05/14/2011    Procedure: TYMPANOPLASTY;  Surgeon: Serena Colonel, MD;  Location: [facility];  Service: ENT;  Laterality: Left;  TYMPANOPLASYT AND  POSSIBLE OSSICULARPLASTY   . Scar revision  08/27/2011    Procedure: SCAR REVISION;  Surgeon: Louisa Second, MD;  Location: [facility];  Service: Plastics;  Laterality: N/A;  scar revision of forehead  . Tympanoplasty  10/10/2011    Procedure:  TYMPANOPLASTY;  Surgeon: Serena Colonel, MD;  Location: [facility];  Service: ENT;  Laterality: Right;  . Tympanoplasty Right 06/30/2012    Procedure: REVISION RIGHT TYMPANOPLASTY;  Surgeon: Serena Colonel, MD;  Location: [facility];  Service: ENT;  Laterality: Right;    History reviewed. No pertinent family history.  History  Substance Use Topics  . Smoking status: Never Smoker   . Smokeless tobacco: Never Used  . Alcohol Use: No    OB History   Grav Para Term Preterm Abortions TAB SAB Ect Mult Living                  Review of Systems  Constitutional: Positive for fever. Negative for chills.  HENT: Positive for hearing loss, ear pain, sore throat, facial swelling, neck stiffness and ear discharge. Negative for nosebleeds, neck pain and tinnitus.   Eyes: Negative for visual disturbance.  Respiratory: Negative for cough and shortness of breath.   Cardiovascular: Negative for chest pain, palpitations and leg swelling.  Gastrointestinal: Negative for nausea, vomiting and abdominal pain.  Endocrine: Negative for polydipsia and polyuria.  Genitourinary: Negative for dysuria, urgency and frequency.  Musculoskeletal: Negative for myalgias and arthralgias.  Skin: Negative for rash.  Neurological: Negative for dizziness, weakness and light-headedness.    Allergies  Review of patient's allergies indicates no known allergies.  Home Medications   Current Outpatient Rx  Name  Route  Sig  Dispense  Refill  . ciprofloxacin-dexamethasone (CIPRODEX) otic suspension   Left Ear   Place 3 drops into the left ear 3 (three) times daily.   7.5 mL   2   . HYDROcodone-acetaminophen (NORCO) 7.5-325 MG per tablet   Oral   Take 1 tablet by mouth every 6 (six) hours as needed for pain.   30 tablet   0   . promethazine (PHENERGAN) 25 MG suppository   Rectal   Place 1 suppository (25 mg total) rectally every 6 (six) hours as needed for nausea.   12 each   0     BP 101/67  Pulse 138   Temp(Src) 98 F (36.7 C) (Oral)  Resp 20  SpO2 100%  LMP 06/09/2012  Physical Exam  Nursing note and vitals reviewed. Constitutional: She is oriented to person, place, and time. Vital signs are normal. She appears well-developed and well-nourished. No distress.  HENT:  Head: Atraumatic.  Right Ear: There is drainage and tenderness. A foreign body (this is probably the gelfoam packing placed during surgery ) is present. There is mastoid tenderness.  Blood drainage from ear canal.  Oozing and blood drainage from behind the ear as well.    Eyes: EOM are normal. Pupils are equal, round, and reactive to light.  Cardiovascular: Normal rate, regular rhythm and normal heart sounds.  Exam reveals no gallop and no friction rub.   No murmur heard. Pulmonary/Chest: Effort normal and breath sounds normal. No respiratory distress. She has no wheezes. She has no rales.  Abdominal: Soft. There is no tenderness.  Neurological: She is alert and oriented to person, place, and time. She has normal strength.  Skin: Skin is warm and dry. She is not diaphoretic.  Psychiatric: She has a normal mood and affect. Her behavior is normal. Judgment normal.    ED Course  Procedures (including critical care time)  Labs Reviewed - No data to display No results found.   1. Ear pain, right       MDM  Case discussed with attending.  There is concern here for development of mastoiditis.  Decision made to transfer pt to ED for further eval and consideration for CT.     Note: the first set of vitals in computer from here incorrect - pt is not tachycardic         Graylon Good, PA-C 07/03/12 1952

## 2012-07-03 NOTE — ED Notes (Signed)
Wait time advised 

## 2012-07-03 NOTE — ED Notes (Signed)
Pt is c/o of right ear pain. And not able to hear well since Monday. Pt was recently seen on 6/2 for same problem.  Pt has rx for ciprodex and hydrocodone

## 2012-07-04 ENCOUNTER — Emergency Department (HOSPITAL_COMMUNITY)
Admission: EM | Admit: 2012-07-04 | Discharge: 2012-07-04 | Disposition: A | Discharge status: Home / Self Care | Payer: BC Managed Care – PPO | Attending: Emergency Medicine | Admitting: Emergency Medicine

## 2012-07-04 DIAGNOSIS — H6011 Cellulitis of right external ear: Secondary | ICD-10-CM

## 2012-07-04 MED ORDER — DEXTROSE 5 % IV SOLN
1.0000 g | Freq: Once | INTRAVENOUS | Status: AC
  Administered 2012-07-04: 1 g via INTRAVENOUS
  Filled 2012-07-04: qty 10

## 2012-07-04 MED ORDER — MORPHINE SULFATE 4 MG/ML IJ SOLN
4.0000 mg | Freq: Once | INTRAMUSCULAR | Status: AC
  Administered 2012-07-04: 4 mg via INTRAVENOUS
  Filled 2012-07-04: qty 1

## 2012-07-04 MED ORDER — CEPHALEXIN 500 MG PO CAPS: 500.0000 mg | ORAL_CAPSULE | Freq: Four times a day (QID) | ORAL | Status: DC

## 2012-07-04 MED ORDER — OXYCODONE-ACETAMINOPHEN 5-325 MG PO TABS: 2.0000 | ORAL_TABLET | ORAL | Status: DC | PRN

## 2012-07-04 NOTE — ED Provider Notes (Signed)
History     CSN: [id number]  Arrival date & time 07/03/12  1955   First MD Initiated Contact with Patient 07/04/12 0041      Chief Complaint  Patient presents with  . Ear Drainage  . Otalgia    (Consider location/radiation/quality/duration/timing/severity/associated sxs/prior treatment) HPI Comments: Patient had surgery on her right eardrum by Dr. Pollyann Kennedy four days ago to repair a TM perforation.  She started two days ago to have increased pain and swelling behind the right ear.  There is a yellow drainage coming from the area.  She has had increased pain in this area as well.  No fever or chills.  Her hearing is muffled in the right ear.  She does not speak english and there is a friend in the room with her who acts as Nurse, learning disability.  Patient is a 22 y.o. female presenting with ear drainage. The history is provided by the patient.  Ear Drainage This is a new problem. The current episode started 2 days ago. The problem occurs constantly. The problem has been gradually worsening. Nothing aggravates the symptoms. Nothing relieves the symptoms. She has tried nothing for the symptoms.    Past Medical History  Diagnosis Date  . Hearing loss     right  . 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 Anemia     states has low blood count, no current meds.  . History of gastritis     no current med.  Marland Kitchen History of malaria 2012    Past Surgical History  Procedure Laterality Date  . Tympanoplasty  05/14/2011    Procedure: TYMPANOPLASTY;  Surgeon: Serena Colonel, MD;  Location: [facility];  Service: ENT;  Laterality: Left;  TYMPANOPLASYT AND  POSSIBLE OSSICULARPLASTY   . Scar revision  08/27/2011    Procedure: SCAR REVISION;  Surgeon: Louisa Second, MD;  Location: [facility];  Service: Plastics;  Laterality: N/A;  scar revision of forehead  . Tympanoplasty  10/10/2011    Procedure: TYMPANOPLASTY;  Surgeon: Serena Colonel, MD;  Location: [facility];  Service: ENT;   Laterality: Right;  . Tympanoplasty Right 06/30/2012    Procedure: REVISION RIGHT TYMPANOPLASTY;  Surgeon: Serena Colonel, MD;  Location:  SURGERY CENTER;  Service: ENT;  Laterality: Right;    History reviewed. No pertinent family history.  History  Substance Use Topics  . Smoking status: Never Smoker   . Smokeless tobacco: Never Used  . Alcohol Use: No    OB History   Grav Para Term Preterm Abortions TAB SAB Ect Mult Living                  Review of Systems  All other systems reviewed and are negative.    Allergies  Review of patient's allergies indicates no known allergies.  Home Medications   Current Outpatient Rx  Name  Route  Sig  Dispense  Refill  . ciprofloxacin-dexamethasone (CIPRODEX) otic suspension   Left Ear   Place 3 drops into the left ear 3 (three) times daily.   7.5 mL   2   . HYDROcodone-acetaminophen (NORCO) 7.5-325 MG per tablet   Oral   Take 1 tablet by mouth every 6 (six) hours as needed for pain.   30 tablet   0     BP 111/64  Pulse 70  Temp(Src) 97.8 F (36.6 C) (Oral)  Resp 16  Ht 5\' 2"  (1.575 m)  Wt 115 lb 3 oz (52.249 kg)  BMI 21.06 kg/m2  SpO2 100%  LMP 06/09/2012  Physical Exam  Nursing note and vitals reviewed. Constitutional: She is oriented to person, place, and time. She appears well-developed and well-nourished. No distress.  HENT:  Head: Normocephalic and atraumatic.  Mouth/Throat: Oropharynx is clear and moist.  The area where the ear meets the mastoid is noted to have swelling, redness, and yellow crusty drainage.  The TM is also noted to be erythematous with fluid present both in the canal and behind the TM.  Neck: Normal range of motion. Neck supple.  Musculoskeletal: Normal range of motion.  Lymphadenopathy:    She has no cervical adenopathy.  Neurological: She is alert and oriented to person, place, and time. No cranial nerve deficit.  Skin: Skin is warm and dry. She is not diaphoretic.    ED Course   Procedures (including critical care time)  Labs Reviewed - No data to display No results found.   No diagnosis found.    MDM  There appears to be a cellulitis to the outside of the right ear.  I have given rocephin and will discharge with keflex and pain meds.  She has an appointment to see ENT tomorrow and she is to keep this.        Geoffery Lyons, MD 07/04/12 [phone]

## 2012-07-04 NOTE — ED Notes (Signed)
C/o HA, R face, R mastoid and R ear pain, also drainage, no active drainage or bleeding at this time, evidence of previous drainage (red and yellow) on white handkerchief, tongue white/yellow coated, throat mildly swollen and irritated, no obvious exudate, denies sob or other sx.

## 2012-07-04 NOTE — ED Notes (Signed)
Alert, NAD, calm, family at [facility], updated about wait and plan.

## 2012-07-05 NOTE — ED Provider Notes (Signed)
Medical screening examination/treatment/procedure(s) were performed by non-physician practitioner and as supervising physician I was immediately available for consultation/collaboration.   [facility]; MD  Sharin Grave, MD 07/05/12 [phone]

## 2013-07-06 ENCOUNTER — Telehealth: Payer: Self-pay | Admitting: Psychology

## 2013-07-06 NOTE — Telephone Encounter (Signed)
Patient called to schedule an appointment.  I could not tell whether she intended to call me (psychologist) to schedule an appointment or whether she was calling to schedule as a follow-up to her ED visit.  I think it is the latter.  There is a significant language barrier and the dynamics of the phone did not help with this.  I attempted to clarify several times what the patient needed and eventually scheduled her to see her PCP.  If she needs a referral to a therapist, please let me know.  I am not taking new patients but can offer up some resources.  She may need help securing this appointment as it is difficult to understand her over the phone.

## 2013-07-14 ENCOUNTER — Ambulatory Visit (INDEPENDENT_AMBULATORY_CARE_PROVIDER_SITE_OTHER): Payer: BC Managed Care – PPO | Admitting: Family Medicine

## 2013-07-14 ENCOUNTER — Encounter: Payer: Self-pay | Admitting: Family Medicine

## 2013-07-14 ENCOUNTER — Telehealth: Payer: Self-pay | Admitting: Family Medicine

## 2013-07-14 VITALS — BP 104/68 | HR 65 | Temp 98.2°F | Wt 123.0 lb

## 2013-07-14 DIAGNOSIS — B9689 Other specified bacterial agents as the cause of diseases classified elsewhere: Secondary | ICD-10-CM | POA: Insufficient documentation

## 2013-07-14 DIAGNOSIS — H9209 Otalgia, unspecified ear: Secondary | ICD-10-CM

## 2013-07-14 DIAGNOSIS — A499 Bacterial infection, unspecified: Secondary | ICD-10-CM

## 2013-07-14 DIAGNOSIS — N760 Acute vaginitis: Secondary | ICD-10-CM

## 2013-07-14 DIAGNOSIS — R109 Unspecified abdominal pain: Secondary | ICD-10-CM

## 2013-07-14 DIAGNOSIS — R319 Hematuria, unspecified: Secondary | ICD-10-CM

## 2013-07-14 DIAGNOSIS — R102 Pelvic and perineal pain: Secondary | ICD-10-CM

## 2013-07-14 LAB — POCT URINALYSIS DIPSTICK
Bilirubin, UA: NEGATIVE
Glucose, UA: NEGATIVE
Ketones, UA: NEGATIVE
Nitrite, UA: NEGATIVE
PROTEIN UA: NEGATIVE
SPEC GRAV UA: 1.02
UROBILINOGEN UA: 1
pH, UA: 7.5

## 2013-07-14 LAB — POCT URINE PREGNANCY: Preg Test, Ur: NEGATIVE

## 2013-07-14 LAB — POCT UA - MICROSCOPIC ONLY

## 2013-07-14 MED ORDER — CIPROFLOXACIN HCL 500 MG PO TABS: 500.0000 mg | ORAL_TABLET | Freq: Two times a day (BID) | ORAL | Status: DC

## 2013-07-14 MED ORDER — METRONIDAZOLE 500 MG PO TABS: 500.0000 mg | ORAL_TABLET | Freq: Two times a day (BID) | ORAL | Status: DC

## 2013-07-14 NOTE — Assessment & Plan Note (Signed)
UA obtained today to assess for infection. Small blood likely related to her period. She does has + Leukocyte and clue cells. I send her urine for culture and started her on Ciprofloxacin. Instruction on how to use medication discuss via an interpreter. F/U in 1 wk. She verbalized understanding.

## 2013-07-14 NOTE — Assessment & Plan Note (Signed)
Started on Metronidazole. Pelvic exam deferred till her next visit in 1 wk due to menstruation and she denies vaginal discharge. F/U in 1 wk.

## 2013-07-14 NOTE — Patient Instructions (Signed)
Urinary Tract Infection °A urinary tract infection (UTI) can occur any place along the urinary tract. The tract includes the kidneys, ureters, bladder, and urethra. A type of germ called bacteria often causes a UTI. UTIs are often helped with antibiotic medicine.  °HOME CARE  °· If given, take antibiotics as told by your doctor. Finish them even if you start to feel better. °· Drink enough fluids to keep your pee (urine) clear or pale yellow. °· Avoid tea, drinks with caffeine, and bubbly (carbonated) drinks. °· Pee often. Avoid holding your pee in for a long time. °· Pee before and after having sex (intercourse). °· Wipe from front to back after you poop (bowel movement) if you are a woman. Use each tissue only once. °GET HELP RIGHT AWAY IF:  °· You have back pain. °· You have lower belly (abdominal) pain. °· You have chills. °· You feel sick to your stomach (nauseous). °· You throw up (vomit). °· Your burning or discomfort with peeing does not go away. °· You have a fever. °· Your symptoms are not better in 3 days. °MAKE SURE YOU:  °· Understand these instructions. °· Will watch your condition. °· Will get help right away if you are not doing well or get worse. °Document Released: 07/04/2007 Document Revised: 10/10/2011 Document Reviewed: 08/16/2011 °ExitCare® Patient Information ©2014 [employer]. ° °

## 2013-07-14 NOTE — Progress Notes (Signed)
Subjective:     Patient ID: Heather Savage, female   DOB: 03/20/1990, 23 y.o.   MRN: [id number]  HPI Ear problem:Here for follow up after ED visit for earache. Currently denies any pain, no ear discharge,no hearing loss, she completed A/B given at the ED. She had surgery of her ears B/L, she does know know the type of ear surgery she had done, this was done due to ear discharge, she feels better now. Hematuria/Pelvic pain: C/O urinary problem since 8 months, gradually worsening.Odor in her urine,worsen after sexual intercourse, she denies vaginal discharge,there is associated suprapubic pain, she feels like she has more urge to urinate, she denies blood in her urine initially,but after urinating there is drop of blood that follows. 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 LMP: Currently having her period. Sexually active with same partner for 1 yr. Last sexual activity was 1 wk ago.  No current outpatient prescriptions on file prior to visit.   No current facility-administered medications on file prior to visit.   Past Medical History  Diagnosis Date  . Hearing loss     right  . 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 Anemia     states has low blood count, no current meds.  . History of gastritis     no current med.  Marland Kitchen. History of malaria 2012      Review of Systems  HENT: Negative.   Respiratory: Negative.   Cardiovascular: Negative.   Gastrointestinal: Negative.   Genitourinary: Positive for frequency, hematuria and pelvic pain. Negative for dysuria, flank pain, vaginal bleeding, vaginal discharge, vaginal pain, menstrual problem and dyspareunia.  All other systems reviewed and are negative.  Filed Vitals:   07/14/13 0851  BP: 104/68  Pulse: 65  Temp: 98.2 F (36.8 C)  TempSrc: Oral  Weight: 123 lb (55.792 kg)       Objective:   Physical Exam  Nursing note and vitals  reviewed. Constitutional: She appears well-developed. No distress.  HENT:  Right Ear: Tympanic membrane normal. No drainage or tenderness. Tympanic membrane is not erythematous.  Left Ear: Tympanic membrane normal. No drainage or tenderness. Tympanic membrane is not erythematous.  Cardiovascular: Normal rate, regular rhythm and normal heart sounds.   No murmur heard. Pulmonary/Chest: Effort normal and breath sounds normal. No respiratory distress. She has no wheezes. She exhibits no tenderness.  Abdominal: Soft. Bowel sounds are normal. She exhibits no distension. There is tenderness in the suprapubic area.  Genitourinary:  Deferred, currently having her period.       Assessment:     Otalgia/Periauricular cellulitis resolved Hematuria Suprapubic pain    Plan:     Check problem list.

## 2013-07-14 NOTE — Assessment & Plan Note (Signed)
Likely due to urine infection. Urine culture obtained. She is started on A/B. If pain persist despite A/B I will obtain pelvic U/S F/U in 1 wk for reassessment.

## 2013-07-14 NOTE — Assessment & Plan Note (Signed)
Resolved S/P A/B therapy. F/U ENT prn.

## 2013-07-14 NOTE — Telephone Encounter (Signed)
Message left using an [place] interpreter. She has two A/B to pick up at the pharmacy,f/u in 1 wk as instructed.

## 2013-07-16 LAB — URINE CULTURE
Colony Count: NO GROWTH
Organism ID, Bacteria: NO GROWTH

## 2013-08-04 ENCOUNTER — Ambulatory Visit (INDEPENDENT_AMBULATORY_CARE_PROVIDER_SITE_OTHER): Payer: BC Managed Care – PPO | Admitting: Family Medicine

## 2013-08-04 ENCOUNTER — Encounter: Payer: Self-pay | Admitting: Family Medicine

## 2013-08-04 VITALS — BP 109/74 | HR 73 | Temp 98.3°F | Wt 123.0 lb

## 2013-08-04 DIAGNOSIS — R109 Unspecified abdominal pain: Secondary | ICD-10-CM

## 2013-08-04 DIAGNOSIS — B9689 Other specified bacterial agents as the cause of diseases classified elsewhere: Secondary | ICD-10-CM

## 2013-08-04 DIAGNOSIS — R300 Dysuria: Secondary | ICD-10-CM

## 2013-08-04 DIAGNOSIS — N760 Acute vaginitis: Secondary | ICD-10-CM

## 2013-08-04 DIAGNOSIS — R1030 Lower abdominal pain, unspecified: Secondary | ICD-10-CM

## 2013-08-04 DIAGNOSIS — A499 Bacterial infection, unspecified: Secondary | ICD-10-CM

## 2013-08-04 NOTE — Assessment & Plan Note (Signed)
She completed course of Flagyl. Patient asymptomatic currently. No further management at this time.

## 2013-08-04 NOTE — Patient Instructions (Signed)
Pelvic Pain Pelvic pain is pain felt below the belly button and between your hips. It can be caused by many different things. It is important to get help right away. This is especially true for severe, sharp, or unusual pain that comes on suddenly.  HOME CARE  Only take medicine as told by your doctor.  Rest as told by your doctor.  Eat a healthy diet, such as fruits, vegetables, and lean meats.  Drink enough fluids to keep your pee (urine) clear or pale yellow, or as told.  Avoid sex (intercourse) if it causes pain.  Apply warm or cold packs to your lower belly (abdomen). Use the type of pack that helps the pain.  Avoid situations that cause you stress.  Keep a journal to track your pain. Write down:  When the pain started.  Where it is located.  If there are things that seem to be related to the pain, such as food or your period.  Follow up with your doctor as told. GET HELP RIGHT AWAY IF:   You have heavy bleeding from the vagina.  You have more pelvic pain.  You feel lightheaded or pass out (faint).  You have chills.  You have pain when you pee or have blood in your pee.  You cannot stop having watery poop (diarrhea).  You cannot stop throwing up (vomiting).  You have a fever or lasting symptoms for more than 3 days.  You have a fever and your symptoms suddenly get worse.  You are being physically or sexually abused.  Your medicine does not help your pain.  You have fluid (discharge) coming from your vagina that is not normal. MAKE SURE YOU:  Understand these instructions.  Will watch your condition.  Will get help if you are not doing well or get worse. Document Released: 07/04/2007 Document Revised: 07/17/2011 Document Reviewed: 05/07/2011 ExitCare Patient Information 2015 [employer]. This information is not intended to replace advice given to you by your health care provider. Make sure you discuss any questions you have with your health care  provider.  

## 2013-08-04 NOTE — Assessment & Plan Note (Signed)
Still symptomatic. Etiology unclear. Pelvic U/S ordered to assess for intra-pubic pathology. I will call with result or discuss at follow up.

## 2013-08-04 NOTE — Progress Notes (Signed)
Subjective:     Patient ID: Heather Savage, female   DOB: 10/16/1990, 23 y.o.   MRN: [id number]  HPI Pelvic pain:She continue to complain of suprapubic pain constant and worse with full bladder,she stated she feels the need to run to the bathroom more often due to her suprapubic pressure pain, this has been on going for months, now worsening and constant. Dysuria: Feels better, still have feeling of bladder fulness,denies blood in her urine,no burning with urination.She completed course of Ciprofloxacin. BV:No vaginal discharge,no vaginal bleeding, LMP was 1 month ago, irregular but this is not new for her.She completed her course of Flagyl.   Current Outpatient Prescriptions on File Prior to Visit  Medication Sig Dispense Refill  . ciprofloxacin (CIPRO) 500 MG tablet Take 1 tablet (500 mg total) by mouth 2 (two) times daily.  6 tablet  0  . metroNIDAZOLE (FLAGYL) 500 MG tablet Take 1 tablet (500 mg total) by mouth 2 (two) times daily.  14 tablet  0   No current facility-administered medications on file prior to visit.   Past Medical History  Diagnosis Date  . Hearing loss     right  . 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 Anemia     states has low blood count, no current meds.  . History of gastritis     no current med.  Marland Kitchen. History of malaria 2012     Review of Systems  Respiratory: Negative.   Cardiovascular: Negative.   Gastrointestinal:       Suprapubic pain  Genitourinary: Positive for urgency. Negative for dysuria, frequency, hematuria, vaginal bleeding and vaginal discharge.  All other systems reviewed and are negative.  Filed Vitals:   08/04/13 0929  BP: 109/74  Pulse: 73  Temp: 98.3 F (36.8 C)  TempSrc: Oral  Weight: 123 lb (55.792 kg)       Objective:   Physical Exam  Nursing note and vitals reviewed. Constitutional: She is oriented to person, place, and time. She appears well-developed. No distress.  Cardiovascular: Normal rate, regular  rhythm, normal heart sounds and intact distal pulses.   No murmur heard. Pulmonary/Chest: Effort normal and breath sounds normal. No respiratory distress. She has no wheezes.  Abdominal: Soft. Bowel sounds are normal. She exhibits no distension. There is no hepatosplenomegaly. There is tenderness in the suprapubic area. There is no CVA tenderness.  Neurological: She is alert and oriented to person, place, and time.       Assessment:     Suprapubic pain Dysuria: Resolved BV:     Plan:     Check problem list.

## 2013-08-04 NOTE — Assessment & Plan Note (Signed)
Resolved s/p A/B treatment.

## 2013-08-07 ENCOUNTER — Ambulatory Visit (HOSPITAL_COMMUNITY)
Admission: RE | Admit: 2013-08-07 | Discharge: 2013-08-07 | Disposition: A | Discharge status: Home / Self Care | Payer: BC Managed Care – PPO | Source: Ambulatory Visit | Attending: Family Medicine | Admitting: Family Medicine

## 2013-08-07 DIAGNOSIS — R1030 Lower abdominal pain, unspecified: Secondary | ICD-10-CM

## 2013-08-07 DIAGNOSIS — N83209 Unspecified ovarian cyst, unspecified side: Secondary | ICD-10-CM | POA: Insufficient documentation

## 2013-08-07 DIAGNOSIS — R109 Unspecified abdominal pain: Secondary | ICD-10-CM | POA: Insufficient documentation

## 2013-08-10 ENCOUNTER — Telehealth: Payer: Self-pay | Admitting: Family Medicine

## 2013-08-10 NOTE — Telephone Encounter (Signed)
Message left for her to schedule follow up appointment with me to discuss U/S report. Please call patient to schedule follow up with me.

## 2013-08-14 ENCOUNTER — Ambulatory Visit (INDEPENDENT_AMBULATORY_CARE_PROVIDER_SITE_OTHER): Payer: BC Managed Care – PPO | Admitting: Family Medicine

## 2013-08-14 ENCOUNTER — Encounter: Payer: Self-pay | Admitting: Family Medicine

## 2013-08-14 VITALS — BP 98/70 | HR 64 | Ht 62.0 in | Wt 124.0 lb

## 2013-08-14 DIAGNOSIS — N949 Unspecified condition associated with female genital organs and menstrual cycle: Secondary | ICD-10-CM

## 2013-08-14 DIAGNOSIS — Z01419 Encounter for gynecological examination (general) (routine) without abnormal findings: Secondary | ICD-10-CM

## 2013-08-14 DIAGNOSIS — N926 Irregular menstruation, unspecified: Secondary | ICD-10-CM

## 2013-08-14 DIAGNOSIS — R102 Pelvic and perineal pain unspecified side: Secondary | ICD-10-CM

## 2013-08-14 NOTE — Assessment & Plan Note (Signed)
Pelvic U/S reviewed and discussed with patient using an interpreter. She does have complex ovarian cyst with some hemorrhages. Repeat U/S recommended in 6-12 weeks. Pain has resolved. Patient reassured ovarian cyst does not look malignant. We will reschedule U/S in 6 wks.

## 2013-08-14 NOTE — Patient Instructions (Signed)
Ovarian Cyst °An ovarian cyst is a sac filled with fluid or blood. This sac is attached to the ovary. Some cysts go away on their own. Other cysts need treatment.  °HOME CARE  °· Only take medicine as told by your doctor. °· Follow up with your doctor as told. °· Get regular pelvic exams and Pap tests. °GET HELP IF: °· Your periods are late, not regular, or painful. °· You stop having periods. °· Your belly (abdominal) or pelvic pain does not go away. °· Your belly becomes large or puffy (swollen). °· You have a hard time peeing (totally emptying your bladder). °· You have pressure on your bladder. °· You have pain during sex. °· You feel fullness, pressure, or discomfort in your belly. °· You lose weight for no reason. °· You feel sick most of the time. °· You have a hard time pooping (constipation). °· You do not feel like eating. °· You develop pimples (acne). °· You have an increase in hair on your body and face. °· You are gaining weight for no reason. °· You think you are pregnant. °GET HELP RIGHT AWAY IF:  °· Your belly pain gets worse. °· You feel sick to your stomach (nauseous), and you throw up (vomit). °· You have a fever that comes on fast. °· You have belly pain while pooping (bowel movement). °· Your periods are heavier than usual. °MAKE SURE YOU:  °· Understand these instructions. °· Will watch your condition. °· Will get help right away if you are not doing well or get worse. °Document Released: 07/04/2007 Document Revised: 11/05/2012 Document Reviewed: 09/22/2012 °ExitCare® Patient Information ©2015 [employer]. This information is not intended to replace advice given to you by your health care provider. Make sure you discuss any questions you have with your health care provider. ° °

## 2013-08-14 NOTE — Progress Notes (Signed)
Subjective:     Patient ID: Heather Savage, female   DOB: 04/26/1990, 23 y.o.   MRN: [id number]  HPI Pelvic Pain: here to follow up with her pelvic U/S. Pain has improved since last visit.denies any other concern. Irregular menses: This has been on going for years. Here for follow up. PAP: previous abnormal  PAP, here to discuss.  No current outpatient prescriptions on file prior to visit.   No current facility-administered medications on file prior to visit.   Past Medical History  Diagnosis Date  . Hearing loss     right  . 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 Anemia     states has low blood count, no current meds.  . History of gastritis     no current med.  Marland Kitchen. History of malaria 2012    Review of Systems  Respiratory: Negative.   Cardiovascular: Negative.   Gastrointestinal: Negative.   Genitourinary: Positive for menstrual problem. Negative for dysuria, frequency, flank pain, vaginal bleeding and vaginal discharge.  All other systems reviewed and are negative.  Filed Vitals:   08/14/13 1131  BP: 98/70  Pulse: 64  Height: 5\' 2"  (1.575 m)  Weight: 124 lb (56.246 kg)       Objective:   Physical Exam  Nursing note and vitals reviewed. Constitutional: She appears well-developed. No distress.  Cardiovascular: Normal rate, regular rhythm, normal heart sounds and intact distal pulses.   No murmur heard. Pulmonary/Chest: Effort normal and breath sounds normal. No respiratory distress. She has no wheezes.  Abdominal: Soft. Bowel sounds are normal. She exhibits no distension and no mass. There is no tenderness.       Assessment:     Pelvic pain Irregular menses Gynecologic exam     Plan:     Check problem list.

## 2013-08-14 NOTE — Assessment & Plan Note (Signed)
Long standing issue with her. There could be some elements of PCOS since her U/S also showed some cyst. U preg was done few weeks ago and was negative. Will discuss further work up at next visit in 1 wk.

## 2013-08-14 NOTE — Assessment & Plan Note (Signed)
I discussed her PAP test result with her that showed ASCUS in 2013. Plan to repeat PAP. She will follow up in 1 wk for PAP.

## 2013-08-21 ENCOUNTER — Encounter: Payer: Self-pay | Admitting: Family Medicine

## 2013-08-21 ENCOUNTER — Other Ambulatory Visit (HOSPITAL_COMMUNITY)
Admission: RE | Admit: 2013-08-21 | Discharge: 2013-08-21 | Disposition: A | Discharge status: Home / Self Care | Payer: BC Managed Care – PPO | Source: Ambulatory Visit | Attending: Family Medicine | Admitting: Family Medicine

## 2013-08-21 ENCOUNTER — Telehealth: Payer: Self-pay | Admitting: Family Medicine

## 2013-08-21 ENCOUNTER — Ambulatory Visit (INDEPENDENT_AMBULATORY_CARE_PROVIDER_SITE_OTHER): Payer: BC Managed Care – PPO | Admitting: Family Medicine

## 2013-08-21 VITALS — BP 106/70 | HR 55 | Temp 97.2°F | Ht 62.0 in | Wt 123.8 lb

## 2013-08-21 DIAGNOSIS — R300 Dysuria: Secondary | ICD-10-CM

## 2013-08-21 DIAGNOSIS — N760 Acute vaginitis: Secondary | ICD-10-CM | POA: Insufficient documentation

## 2013-08-21 DIAGNOSIS — R42 Dizziness and giddiness: Secondary | ICD-10-CM

## 2013-08-21 DIAGNOSIS — R51 Headache: Secondary | ICD-10-CM

## 2013-08-21 DIAGNOSIS — Z01419 Encounter for gynecological examination (general) (routine) without abnormal findings: Secondary | ICD-10-CM

## 2013-08-21 DIAGNOSIS — R519 Headache, unspecified: Secondary | ICD-10-CM | POA: Insufficient documentation

## 2013-08-21 DIAGNOSIS — Z124 Encounter for screening for malignant neoplasm of cervix: Secondary | ICD-10-CM

## 2013-08-21 DIAGNOSIS — Z113 Encounter for screening for infections with a predominantly sexual mode of transmission: Secondary | ICD-10-CM | POA: Insufficient documentation

## 2013-08-21 DIAGNOSIS — N898 Other specified noninflammatory disorders of vagina: Secondary | ICD-10-CM

## 2013-08-21 LAB — POCT URINALYSIS DIPSTICK
Bilirubin, UA: NEGATIVE
Glucose, UA: NEGATIVE
Ketones, UA: NEGATIVE
Nitrite, UA: NEGATIVE
PROTEIN UA: NEGATIVE
UROBILINOGEN UA: 0.2
pH, UA: 5.5

## 2013-08-21 LAB — POCT WET PREP (WET MOUNT)
Clue Cells Wet Prep Whiff POC: NEGATIVE
WBC, Wet Prep HPF POC: >20

## 2013-08-21 LAB — POCT UA - MICROSCOPIC ONLY

## 2013-08-21 LAB — POCT URINE PREGNANCY: Preg Test, Ur: NEGATIVE

## 2013-08-21 MED ORDER — DOXYCYCLINE HYCLATE 100 MG PO TABS: 100.0000 mg | ORAL_TABLET | Freq: Two times a day (BID) | ORAL | Status: DC

## 2013-08-21 MED ORDER — IBUPROFEN 400 MG PO TABS: 400.0000 mg | ORAL_TABLET | Freq: Three times a day (TID) | ORAL | Status: DC | PRN

## 2013-08-21 NOTE — Assessment & Plan Note (Signed)
No neuologic deficit. Does not fit migraine diagnosis. Concern for malaria although unlikely. Blood smear ordered for malaria. I started her on Doxycycline to treat porphylactically. CBC ordered due to associated dizziness. Dizziness improved prior to leaving the clinic today. Ibuprofen prescribed prn HA. Rest at home recommended.  Return precaution was given to her.

## 2013-08-21 NOTE — Progress Notes (Signed)
Subjective:     Patient ID: Heather Savage, female   DOB: 06/22/1990, 23 y.o.   MRN: [id number]  HPI Gyn exam:Here for PAP. VD/dysuria:C/O vaginal discharge which started 1 wk ago she had sex with her boy friend and since then she has been having pelvic pain. She described her vaginal discharge as yellowish in color, she denies any odor. + dysuria. LMP was 2 wks ago. HA/Dizziness:C/O frontal headache on going for the last 3 days constantly with minimal improvement , about 8/10 severity at the most,she felt dizzy on Tuesday and vomited which was mixed with blood. No change in bowel habit. No vision change.She stated she used to have malaria frequently when she was in [place], her current symptoms feels like malaria although she had been in [place] for few months now. She denies any recent travel outside the state. She C/O low energy and dizziness for 3 days and excessive sweating,chills,no fever. No fall, no LOC.  No current outpatient prescriptions on file prior to visit.   No current facility-administered medications on file prior to visit.   Past Medical History  Diagnosis Date  . Hearing loss     right  . 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 Anemia     states has low blood count, no current meds.  . History of gastritis     no current med.  Marland Kitchen. History of malaria 2012    Review of Systems  Constitutional: Positive for fatigue. Negative for fever.  Respiratory: Negative.   Cardiovascular: Negative.   Gastrointestinal: Positive for nausea. Negative for diarrhea and constipation.  Genitourinary: Positive for dysuria and vaginal discharge. Negative for frequency.  Neurological: Positive for weakness and headaches.  All other systems reviewed and are negative.  Filed Vitals:   08/21/13 1126  BP: 106/70  Pulse: 55  Temp: 97.2 F (36.2 C)  TempSrc: Oral  Height: 5\' 2"  (1.575 m)  Weight: 123 lb 12.8 oz (56.155 kg)       Objective:   Physical Exam  Nursing note and  vitals reviewed. Constitutional: She is oriented to person, place, and time. She appears well-developed. No distress.  Cardiovascular: Normal rate, regular rhythm and normal heart sounds.   No murmur heard. Pulmonary/Chest: Effort normal and breath sounds normal. No respiratory distress. She has no wheezes.  Abdominal: Soft. Bowel sounds are normal. She exhibits no distension and no mass. There is no tenderness.  Genitourinary: Uterus normal. There is no tenderness on the right labia. There is no tenderness on the left labia. Cervix exhibits no motion tenderness. Right adnexum displays no mass and no tenderness. Left adnexum displays no mass and no tenderness. Vaginal discharge found.  Musculoskeletal: Normal range of motion.  Neurological: She is alert and oriented to person, place, and time. No cranial nerve deficit.       Assessment:     Gyne exam Vaginal discharge Headache     Plan:     Check problem list.

## 2013-08-21 NOTE — Assessment & Plan Note (Signed)
UA showed small leukocyte. Moderate blood s/p PAP test. Urine culture ordered, I will f/u with result.

## 2013-08-21 NOTE — Patient Instructions (Signed)
It was nice seeing you today, you might have some urine infection, hence I will start A/B. I will call you with test result. Your pregnancy test is negative, we will check your blood level to find out why you are dizzy. If this continue please go to the hospital. I will like to see you back next Tuesday.  Dysuria Dysuria is the medical term for pain with urination. There are many causes for dysuria, but urinary tract infection is the most common. If a urinalysis was performed it can show that there is a urinary tract infection. A urine culture confirms that you or your child is sick. You will need to follow up with a healthcare provider because:  If a urine culture was done you will need to know the culture results and treatment recommendations.  If the urine culture was positive, you or your child will need to be put on antibiotics or know if the antibiotics prescribed are the right antibiotics for your urinary tract infection.  If the urine culture is negative (no urinary tract infection), then other causes may need to be explored or antibiotics need to be stopped. Today laboratory work may have been done and there does not seem to be an infection. If cultures were done they will take at least 24 to 48 hours to be completed. Today x-rays may have been taken and they read as normal. No cause can be found for the problems. The x-rays may be re-read by a radiologist and you will be contacted if additional findings are made. You or your child may have been put on medications to help with this problem until you can see your primary caregiver. If the problems get better, see your primary caregiver if the problems return. If you were given antibiotics (medications which kill germs), take all of the mediations as directed for the full course of treatment.  If laboratory work was done, you need to find the results. Leave a telephone number where you can be reached. If this is not possible, make sure you find  out how you are to get test results. HOME CARE INSTRUCTIONS   Drink lots of fluids. For adults, drink eight, 8 ounce glasses of clear juice or water a day. For children, replace fluids as suggested by your caregiver.  Empty the bladder often. Avoid holding urine for long periods of time.  After a bowel movement, women should cleanse front to back, using each tissue only once.  Empty your bladder before and after sexual intercourse.  Take all the medicine given to you until it is gone. You may feel better in a few days, but TAKE ALL MEDICINE.  Avoid caffeine, tea, alcohol and carbonated beverages, because they tend to irritate the bladder.  In men, alcohol may irritate the prostate.  Only take over-the-counter or prescription medicines for pain, discomfort, or fever as directed by your caregiver.  If your caregiver has given you a follow-up appointment, it is very important to keep that appointment. Not keeping the appointment could result in a chronic or permanent injury, pain, and disability. If there is any problem keeping the appointment, you must call back to this facility for assistance. SEEK IMMEDIATE MEDICAL CARE IF:   Back pain develops.  A fever develops.  There is nausea (feeling sick to your stomach) or vomiting (throwing up).  Problems are no better with medications or are getting worse. MAKE SURE YOU:   Understand these instructions.  Will watch your condition.  Will get  help right away if you are not doing well or get worse. Document Released: 10/14/2003 Document Revised: 04/09/2011 Document Reviewed: 08/21/2007 [facility] [place] Patient Information 2015 [place], [place]. This information is not intended to replace advice given to you by your health care provider. Make sure you discuss any questions you have with your health care provider.

## 2013-08-21 NOTE — Assessment & Plan Note (Signed)
Etiology unclear. Urine pregnancy test done today was negative. CBC,TSH ordered. Treat presumptively for malaria. Improve hydration at home as BP is low normal. Return precaution was given.

## 2013-08-21 NOTE — Telephone Encounter (Signed)
error 

## 2013-08-21 NOTE — Assessment & Plan Note (Signed)
Wet prep completed and was negative. GC/Chlamydia ordered. I will call her with test result.

## 2013-08-21 NOTE — Assessment & Plan Note (Signed)
PAP completed today 

## 2013-08-22 LAB — CBC WITH DIFFERENTIAL/PLATELET
Basophils Absolute: 0 10*3/uL (ref 0.0–0.1)
Basophils Relative: 0 % (ref 0–1)
EOS ABS: 0.1 10*3/uL (ref 0.0–0.7)
EOS PCT: 1 % (ref 0–5)
HCT: 33.6 % — ABNORMAL LOW (ref 36.0–46.0)
Hemoglobin: 11.8 g/dL — ABNORMAL LOW (ref 12.0–15.0)
LYMPHS PCT: 30 % (ref 12–46)
Lymphs Abs: 1.6 10*3/uL (ref 0.7–4.0)
MCH: 30.6 pg (ref 26.0–34.0)
MCHC: 35.1 g/dL (ref 30.0–36.0)
MCV: 87.3 fL (ref 78.0–100.0)
Monocytes Absolute: 0.4 10*3/uL (ref 0.1–1.0)
Monocytes Relative: 7 % (ref 3–12)
Neutro Abs: 3.2 10*3/uL (ref 1.7–7.7)
Neutrophils Relative %: 62 % (ref 43–77)
PLATELETS: 241 10*3/uL (ref 150–400)
RBC: 3.85 MIL/uL — ABNORMAL LOW (ref 3.87–5.11)
RDW: 14 % (ref 11.5–15.5)
WBC: 5.2 10*3/uL (ref 4.0–10.5)

## 2013-08-22 LAB — URINE CULTURE
COLONY COUNT: NO GROWTH
Organism ID, Bacteria: NO GROWTH

## 2013-08-22 LAB — TSH: TSH: 2.767 u[IU]/mL (ref 0.350–4.500)

## 2013-08-24 LAB — MALARIA SMEAR
Result - MALAR: NEGATIVE
Result - MALAR: NONE SEEN

## 2013-08-25 ENCOUNTER — Ambulatory Visit (INDEPENDENT_AMBULATORY_CARE_PROVIDER_SITE_OTHER): Payer: BC Managed Care – PPO | Admitting: Family Medicine

## 2013-08-25 ENCOUNTER — Encounter: Payer: Self-pay | Admitting: Family Medicine

## 2013-08-25 VITALS — BP 110/77 | HR 67 | Temp 97.5°F | Resp 16 | Wt 123.0 lb

## 2013-08-25 DIAGNOSIS — R42 Dizziness and giddiness: Secondary | ICD-10-CM

## 2013-08-25 DIAGNOSIS — N898 Other specified noninflammatory disorders of vagina: Secondary | ICD-10-CM

## 2013-08-25 DIAGNOSIS — R51 Headache: Secondary | ICD-10-CM

## 2013-08-25 MED ORDER — BUTALBITAL-ASPIRIN-CAFFEINE 50-325-40 MG PO CAPS: 1.0000 | ORAL_CAPSULE | ORAL | Status: DC | PRN

## 2013-08-25 MED ORDER — METRONIDAZOLE 500 MG PO TABS: 500.0000 mg | ORAL_TABLET | Freq: Two times a day (BID) | ORAL | Status: DC

## 2013-08-25 NOTE — Patient Instructions (Signed)

## 2013-08-25 NOTE — Progress Notes (Signed)
Subjective:     Patient ID: Heather Savage, female   DOB: 03/11/1990, 23 y.o.   MRN: [id number]  HPI Headache/Dizziness: HA for the last 1 month and off, worening over the last few week, improved with ibuprofen, associated with dizziness which has improved as well. Denies blurry vision, no N/V with HA,no photophobia,no phonophobia. No fever. Trigger of her HA is work or stress, improve with rest. HA does not wake her up at night suddenly. But at times wakes up in the morning with HA with eye pain. HA is frontal mostly on the left but can be B/L.Patient stated she will like to take time off work to rest at home and see if there will be some improvement. BV: Vaginal d/c improved a little.  Current Outpatient Prescriptions on File Prior to Visit  Medication Sig Dispense Refill  . doxycycline (VIBRA-TABS) 100 MG tablet Take 1 tablet (100 mg total) by mouth 2 (two) times daily.  14 tablet  0  . ibuprofen (ADVIL,MOTRIN) 400 MG tablet Take 1 tablet (400 mg total) by mouth every 8 (eight) hours as needed for headache.  60 tablet  0   No current facility-administered medications on file prior to visit.   Past Medical History  Diagnosis Date  . Hearing loss     right  . 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 Anemia     states has low blood count, no current meds.  . History of gastritis     no current med.  Marland Kitchen. History of malaria 2012      Review of Systems  Eyes: Negative for visual disturbance.  Respiratory: Negative.   Cardiovascular: Negative.   Gastrointestinal: Negative.   Genitourinary: Positive for vaginal discharge. Negative for dysuria.  Neurological: Positive for headaches.  All other systems reviewed and are negative.      Filed Vitals:   08/25/13 1119  BP: 110/77  Pulse: 67  Temp: 97.5 F (36.4 C)  TempSrc: Oral  Resp: 16  Weight: 123 lb (55.792 kg)  SpO2: 100%    Objective:   Physical Exam  Nursing note and vitals reviewed. Constitutional: She is  oriented to person, place, and time. She appears well-developed. No distress.  Eyes: EOM are normal. Pupils are equal, round, and reactive to light.  Cardiovascular: Normal rate, regular rhythm, normal heart sounds and intact distal pulses.   No murmur heard. Pulmonary/Chest: Effort normal and breath sounds normal. No respiratory distress. She has no wheezes.  Abdominal: Soft. Bowel sounds are normal. She exhibits no distension and no mass. There is no tenderness.  Musculoskeletal: Normal range of motion. She exhibits no edema.  Neurological: She is alert and oriented to person, place, and time. No cranial nerve deficit. Coordination normal.       Assessment:     Headache Dizziness Vaginitis     Plan:     Check problem list.

## 2013-08-25 NOTE — Assessment & Plan Note (Signed)
Etiology unclear. Might be due to stress. Symptom improved. H/H very mildly low, TSH normal. Advised to rest at home and improve hydration.

## 2013-08-25 NOTE — Assessment & Plan Note (Addendum)
Migraine vs tension headache. No neurologic deficit or signs of meningeal irritation. Trial of Fioricet prn. Return precaution was given, if no improvement to f/u. Malaria smear negative and Doxycycline was discontinued. I might consider imaging her then. Rest at home recommended as well.

## 2013-08-25 NOTE — Assessment & Plan Note (Signed)
+   Gardnerella Vaginitis. Metronidazole prescribed.

## 2013-08-26 ENCOUNTER — Telehealth: Payer: Self-pay | Admitting: Family Medicine

## 2013-08-26 LAB — CYTOLOGY - PAP

## 2013-08-26 NOTE — Telephone Encounter (Signed)
rx called in, originally printed.  Pt informed. Fleeger, Maryjo RochesterJessica Dawn

## 2013-08-26 NOTE — Telephone Encounter (Signed)
Pt called because she is unsure what medications she was given for her headaches. She said she only received one prescription yesterday 7/28 but on Epic it shows two. Can someone call her to straighten out. jw

## 2013-08-27 ENCOUNTER — Encounter: Payer: Self-pay | Admitting: Family Medicine

## 2013-09-18 ENCOUNTER — Ambulatory Visit (INDEPENDENT_AMBULATORY_CARE_PROVIDER_SITE_OTHER): Payer: BC Managed Care – PPO | Admitting: Family Medicine

## 2013-09-18 ENCOUNTER — Encounter: Payer: Self-pay | Admitting: Family Medicine

## 2013-09-18 VITALS — BP 114/80 | HR 64 | Temp 97.9°F | Wt 123.0 lb

## 2013-09-18 DIAGNOSIS — N912 Amenorrhea, unspecified: Secondary | ICD-10-CM

## 2013-09-18 DIAGNOSIS — R300 Dysuria: Secondary | ICD-10-CM

## 2013-09-18 DIAGNOSIS — N83209 Unspecified ovarian cyst, unspecified side: Secondary | ICD-10-CM

## 2013-09-18 DIAGNOSIS — N898 Other specified noninflammatory disorders of vagina: Secondary | ICD-10-CM

## 2013-09-18 DIAGNOSIS — N760 Acute vaginitis: Secondary | ICD-10-CM

## 2013-09-18 DIAGNOSIS — N83299 Other ovarian cyst, unspecified side: Secondary | ICD-10-CM

## 2013-09-18 LAB — POCT WET PREP (WET MOUNT)
Clue Cells Wet Prep Whiff POC: NEGATIVE
WBC, Wet Prep HPF POC: >20

## 2013-09-18 LAB — POCT UA - MICROSCOPIC ONLY

## 2013-09-18 LAB — POCT URINALYSIS DIPSTICK
Bilirubin, UA: NEGATIVE
Glucose, UA: NEGATIVE
Ketones, UA: NEGATIVE
Nitrite, UA: POSITIVE
PROTEIN UA: NEGATIVE
Spec Grav, UA: 1.025
Urobilinogen, UA: 0.2
pH, UA: 6

## 2013-09-18 LAB — POCT URINE PREGNANCY: PREG TEST UR: NEGATIVE

## 2013-09-18 MED ORDER — CIPROFLOXACIN-CIPROFLOX HCL ER 500 MG PO TB24: 500.0000 mg | ORAL_TABLET | Freq: Every day | ORAL | Status: DC

## 2013-09-18 NOTE — Assessment & Plan Note (Signed)
Urinalysis showed positive nitrite and small leucocyte. Urine culture ordered. Since she is symptomatic, I will start her on Ciprofloxacin pending test result. Patient informed.

## 2013-09-18 NOTE — Patient Instructions (Signed)
Ovarian Cyst °An ovarian cyst is a sac filled with fluid or blood. This sac is attached to the ovary. Some cysts go away on their own. Other cysts need treatment.  °HOME CARE  °· Only take medicine as told by your doctor. °· Follow up with your doctor as told. °· Get regular pelvic exams and Pap tests. °GET HELP IF: °· Your periods are late, not regular, or painful. °· You stop having periods. °· Your belly (abdominal) or pelvic pain does not go away. °· Your belly becomes large or puffy (swollen). °· You have a hard time peeing (totally emptying your bladder). °· You have pressure on your bladder. °· You have pain during sex. °· You feel fullness, pressure, or discomfort in your belly. °· You lose weight for no reason. °· You feel sick most of the time. °· You have a hard time pooping (constipation). °· You do not feel like eating. °· You develop pimples (acne). °· You have an increase in hair on your body and face. °· You are gaining weight for no reason. °· You think you are pregnant. °GET HELP RIGHT AWAY IF:  °· Your belly pain gets worse. °· You feel sick to your stomach (nauseous), and you throw up (vomit). °· You have a fever that comes on fast. °· You have belly pain while pooping (bowel movement). °· Your periods are heavier than usual. °MAKE SURE YOU:  °· Understand these instructions. °· Will watch your condition. °· Will get help right away if you are not doing well or get worse. °Document Released: 07/04/2007 Document Revised: 11/05/2012 Document Reviewed: 09/22/2012 °ExitCare® Patient Information ©2015 [employer]. This information is not intended to replace advice given to you by your health care provider. Make sure you discuss any questions you have with your health care provider. ° °

## 2013-09-18 NOTE — Progress Notes (Signed)
Subjective:     Patient ID: Heather Savage, female   DOB: 03/12/1990, 23 y.o.   MRN: [id number]  HPI Suprapubic pain/Ovarian cyst: Here for follow up, she continued to have her pain, also worse with urinating. LMP was 2 months ago. She had always had irregular period from the onset.C/O yellowish vaginal discharge. She is currently sexually active and last sexual activity was 1 wk ago. She does not use birth control and she is not trying to get pregnant.  VD/Dysuria: Yellowish  Discharge for 6 months an dysuria as well as urine odor. Amenorrhea: No period for 2 months, does not use birth control, she uses calendar to time her ovulation period and avoids sex during this period.  Current Outpatient Prescriptions on File Prior to Visit  Medication Sig Dispense Refill  . butalbital-aspirin-caffeine (FIORINAL) 50-325-40 MG per capsule Take 1 capsule by mouth every 4 (four) hours as needed for headache or migraine.  14 capsule  0  . ibuprofen (ADVIL,MOTRIN) 400 MG tablet Take 1 tablet (400 mg total) by mouth every 8 (eight) hours as needed for headache.  60 tablet  0  . metroNIDAZOLE (FLAGYL) 500 MG tablet Take 1 tablet (500 mg total) by mouth 2 (two) times daily.  14 tablet  0   No current facility-administered medications on file prior to visit.   Past Medical History  Diagnosis Date  . Hearing loss     right  . 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 Anemia     states has low blood count, no current meds.  . History of gastritis     no current med.  Marland Kitchen. History of malaria 2012      Review of Systems  Respiratory: Negative.   Cardiovascular: Negative.   Gastrointestinal:       Suprapubic pain  Genitourinary: Positive for dysuria and vaginal discharge.  Musculoskeletal: Negative.   All other systems reviewed and are negative.      Filed Vitals:   09/18/13 1024  BP: 114/80  Pulse: 64  Temp: 97.9 F (36.6 C)  TempSrc: Oral  Weight: 123 lb (55.792 kg)     Objective:   Physical Exam  Nursing note and vitals reviewed. Constitutional: She appears well-developed. No distress.  Cardiovascular: Normal rate, regular rhythm, normal heart sounds and intact distal pulses.   No murmur heard. Pulmonary/Chest: Effort normal and breath sounds normal. No respiratory distress. She has no wheezes.  Abdominal: Soft. Bowel sounds are normal. She exhibits no distension. There is tenderness.  Mild suprapubic tenderness  Genitourinary: Uterus normal. Cervix exhibits discharge. Cervix exhibits no motion tenderness. No tenderness around the vagina. Vaginal discharge found.         Assessment:     Ovarian cyst Dysuria Vaginal discharge  Amenorrhea     Plan:     Check problem list.

## 2013-09-18 NOTE — Assessment & Plan Note (Signed)
Wet prep negative.  

## 2013-09-18 NOTE — Assessment & Plan Note (Signed)
I am suspecting patient likely has PCOS. Upreg negative today. I will f/u with her U/S report.

## 2013-09-18 NOTE — Assessment & Plan Note (Signed)
Likely the cause of chronic pelvic pain. ?? PCOS. Pelvic U/S showed complex cyst with blood. Repeat U/S recommended. Repeat U/S scheduled during this visit.

## 2013-09-21 ENCOUNTER — Telehealth: Payer: Self-pay | Admitting: Family Medicine

## 2013-09-21 LAB — URINE CULTURE: Colony Count: >=100000

## 2013-09-21 NOTE — Telephone Encounter (Signed)
O called to discuss urine culture result with patient, + Ecoli, she was sent home on Ciprofloxacin which has good sensitivity, she planned picking up her medication today. Instruction given to follow up in 1 wk for repeat culture since she has had repeated UTI in the last few months. She agreed with plan and verbalized understanding.

## 2013-09-22 ENCOUNTER — Ambulatory Visit (HOSPITAL_COMMUNITY): Admission: RE | Admit: 2013-09-22 | Payer: BC Managed Care – PPO | Source: Ambulatory Visit

## 2013-10-02 ENCOUNTER — Ambulatory Visit (HOSPITAL_COMMUNITY)
Admission: RE | Admit: 2013-10-02 | Discharge: 2013-10-02 | Disposition: A | Discharge status: Home / Self Care | Payer: BC Managed Care – PPO | Source: Ambulatory Visit | Attending: Family Medicine | Admitting: Family Medicine

## 2013-10-02 DIAGNOSIS — N83299 Other ovarian cyst, unspecified side: Secondary | ICD-10-CM

## 2013-10-02 DIAGNOSIS — N838 Other noninflammatory disorders of ovary, fallopian tube and broad ligament: Secondary | ICD-10-CM | POA: Diagnosis present

## 2013-10-02 DIAGNOSIS — R109 Unspecified abdominal pain: Secondary | ICD-10-CM | POA: Insufficient documentation

## 2013-10-20 ENCOUNTER — Other Ambulatory Visit (INDEPENDENT_AMBULATORY_CARE_PROVIDER_SITE_OTHER): Payer: BLUE CROSS/BLUE SHIELD

## 2013-10-20 ENCOUNTER — Telehealth: Payer: Self-pay | Admitting: Family Medicine

## 2013-10-20 ENCOUNTER — Ambulatory Visit: Payer: BC Managed Care – PPO | Admitting: Family Medicine

## 2013-10-20 DIAGNOSIS — R300 Dysuria: Secondary | ICD-10-CM

## 2013-10-20 LAB — POCT UA - MICROSCOPIC ONLY

## 2013-10-20 LAB — POCT URINALYSIS DIPSTICK
Bilirubin, UA: NEGATIVE
GLUCOSE UA: NEGATIVE
KETONES UA: NEGATIVE
Nitrite, UA: NEGATIVE
PH UA: 6.5
Protein, UA: NEGATIVE
Spec Grav, UA: 1.025
Urobilinogen, UA: 0.2

## 2013-10-20 NOTE — Telephone Encounter (Signed)
I tried to reach patient about urine test. No answer and was not able to leave a message,mail box is full. I tried reaching patient via translation phone line. Attempt made to reach her twice.  Plan is to have her follow up with me if still having any urinary concern.

## 2013-10-20 NOTE — Progress Notes (Signed)
Routine urine run

## 2013-10-21 ENCOUNTER — Telehealth: Payer: Self-pay | Admitting: *Deleted

## 2013-10-21 NOTE — Telephone Encounter (Signed)
LMOVM for pt to callback and make appt. Fleeger, Heather Savage

## 2013-10-21 NOTE — Telephone Encounter (Signed)
Message copied by Osborne Oman on Wed Oct 21, 2013 11:49 AM ------      Message from: Janit Pagan T      Created: Wed Oct 21, 2013  9:26 AM       Right. No meds. Follow up with me soon or if symptomatic see another provider soon.      ----- Message -----         From: Princella Pellegrini Fleeger, CMA         Sent: 10/21/2013   9:01 AM           To: Janit Pagan, MD            Before I call, you are not sending her in any meds right. Fleeger, Maryjo Rochester            ----- Message -----         From: Janit Pagan, MD         Sent: 10/20/2013   5:10 PM           To: [facility]            Please call patient to schedule follow up appointment with me soon for her urine infection. If still having symptoms may see another provider.             ------

## 2013-11-17 ENCOUNTER — Ambulatory Visit: Payer: BC Managed Care – PPO | Admitting: Family Medicine

## 2013-12-01 ENCOUNTER — Ambulatory Visit (INDEPENDENT_AMBULATORY_CARE_PROVIDER_SITE_OTHER): Payer: BLUE CROSS/BLUE SHIELD | Admitting: Family Medicine

## 2013-12-01 ENCOUNTER — Ambulatory Visit (INDEPENDENT_AMBULATORY_CARE_PROVIDER_SITE_OTHER): Payer: BLUE CROSS/BLUE SHIELD | Admitting: *Deleted

## 2013-12-01 ENCOUNTER — Encounter: Payer: Self-pay | Admitting: Family Medicine

## 2013-12-01 VITALS — BP 117/65 | HR 67 | Temp 97.8°F | Ht 62.0 in | Wt 131.0 lb

## 2013-12-01 DIAGNOSIS — R102 Pelvic and perineal pain: Secondary | ICD-10-CM | POA: Diagnosis not present

## 2013-12-01 DIAGNOSIS — Z23 Encounter for immunization: Secondary | ICD-10-CM | POA: Diagnosis not present

## 2013-12-01 DIAGNOSIS — Z0000 Encounter for general adult medical examination without abnormal findings: Secondary | ICD-10-CM

## 2013-12-01 DIAGNOSIS — N390 Urinary tract infection, site not specified: Secondary | ICD-10-CM | POA: Insufficient documentation

## 2013-12-01 DIAGNOSIS — N898 Other specified noninflammatory disorders of vagina: Secondary | ICD-10-CM | POA: Diagnosis not present

## 2013-12-01 LAB — POCT WET PREP (WET MOUNT)
Clue Cells Wet Prep Whiff POC: NEGATIVE
WBC, Wet Prep HPF POC: >20

## 2013-12-01 NOTE — Progress Notes (Signed)
Subjective:     Patient ID: Heather Savage, female   DOB: 01/10/1991, 23 y.o.   MRN: [id number]  HPI   Pelvic pain:C/O Left pelvic pain on and off, this is not new for here, here for follow up.LMP was 2 wks ago, has been regular. VD: Still having vaginal discharge denies. Here for follow up. HM: has not had flu shot. UTI: here for follow up, she completed her A/B, denies any urinary symptoms.  Current Outpatient Prescriptions on File Prior to Visit  Medication Sig Dispense Refill  . butalbital-aspirin-caffeine (FIORINAL) 50-325-40 MG per capsule Take 1 capsule by mouth every 4 (four) hours as needed for headache or migraine. 14 capsule 0   No current facility-administered medications on file prior to visit.   Past Medical History  Diagnosis Date  . Hearing loss     right  . 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 Anemia     states has low blood count, no current meds.  . History of gastritis     no current med.  Marland Kitchen. History of malaria 2012      Review of Systems  Respiratory: Negative.   Cardiovascular: Negative.   Gastrointestinal: Negative for abdominal distention.       Left suprapubic pain  Genitourinary: Positive for vaginal discharge. Negative for vaginal bleeding and vaginal pain.  All other systems reviewed and are negative.  Filed Vitals:   12/01/13 1037  BP: 117/65  Pulse: 67  Temp: 97.8 F (36.6 C)  TempSrc: Oral  Height: 5\' 2"  (1.575 m)  Weight: 131 lb (59.421 kg)       Objective:   Physical Exam  Constitutional: She appears well-developed. No distress.  Cardiovascular: Normal rate, regular rhythm, normal heart sounds and intact distal pulses.   No murmur heard. Pulmonary/Chest: Effort normal and breath sounds normal. No respiratory distress. She has no wheezes.  Abdominal: Soft. Bowel sounds are normal. She exhibits no distension and no mass. There is no tenderness.  Genitourinary: Uterus normal. Cervix exhibits discharge. Cervix  exhibits no motion tenderness and no friability. No vaginal discharge found.  Nursing note and vitals reviewed.      Assessment:     Pelvic pain Vaginal discharge Health maintenance UTI: resolved     Plan:     Check problem list.

## 2013-12-01 NOTE — Progress Notes (Signed)
[employer] used [employer] [place] ID [id number]

## 2013-12-01 NOTE — Patient Instructions (Signed)
Vaginitis Vaginitis is an inflammation of the vagina. It can happen when the normal bacteria and yeast in the vagina grow too much. There are different types. Treatment will depend on the type you have. HOME CARE  Take all medicines as told by your doctor.  Keep your vagina area clean and dry. Avoid soap. Rinse the area with water.  Avoid washing and cleaning out the vagina (douching).  Do not use tampons or have sex (intercourse) until your treatment is done.  Wipe from front to back after going to the restroom.  Wear cotton underwear.  Avoid wearing underwear while you sleep until your vaginitis is gone.  Avoid tight pants. Avoid underwear or nylons without a cotton panel.  Take off wet clothing (such as a bathing suit) as soon as you can.  Use mild, unscented products. Avoid fabric softeners and scented:  Feminine sprays.  Laundry detergents.  Tampons.  Soaps or bubble baths.  Practice safe sex and use condoms. GET HELP RIGHT AWAY IF:   You have belly (abdominal) pain.  You have a fever or lasting symptoms for more than 2-3 days.  You have a fever and your symptoms suddenly get worse. MAKE SURE YOU:   Understand these instructions.  Will watch this condition.  Will get help right away if you are not doing well or get worse. Document Released: 04/13/2008 Document Revised: 10/10/2011 Document Reviewed: 06/28/2011 [employer] Patient Information 2015 [employer]. This information is not intended to replace advice given to you by your health care provider. Make sure you discuss any questions you have with your health care provider.  

## 2013-12-01 NOTE — Assessment & Plan Note (Signed)
Flu shot given today. Otherwise up to date.

## 2013-12-01 NOTE — Assessment & Plan Note (Signed)
Resolved

## 2013-12-01 NOTE — Assessment & Plan Note (Signed)
Wet prep done. No BV or trich per preliminary report. I will call her with final report.

## 2013-12-01 NOTE — Assessment & Plan Note (Addendum)
Currently asymptomatic. Likely due to recurrent ovarian cyst which is benign. i reviewed and discussed pelvic U/S report with her Repeat pelvic U/S came back fine. Patient reassured, we will monitor for now.

## 2013-12-02 ENCOUNTER — Telehealth: Payer: Self-pay | Admitting: Family Medicine

## 2013-12-02 NOTE — Telephone Encounter (Signed)
Wet prep normal.

## 2014-03-31 ENCOUNTER — Ambulatory Visit (INDEPENDENT_AMBULATORY_CARE_PROVIDER_SITE_OTHER): Payer: BLUE CROSS/BLUE SHIELD | Admitting: Family Medicine

## 2014-03-31 ENCOUNTER — Encounter: Payer: Self-pay | Admitting: Family Medicine

## 2014-03-31 VITALS — BP 104/69 | HR 65 | Temp 97.8°F | Ht 62.0 in | Wt 134.0 lb

## 2014-03-31 DIAGNOSIS — N912 Amenorrhea, unspecified: Secondary | ICD-10-CM

## 2014-03-31 DIAGNOSIS — R319 Hematuria, unspecified: Secondary | ICD-10-CM

## 2014-03-31 DIAGNOSIS — R300 Dysuria: Secondary | ICD-10-CM | POA: Diagnosis not present

## 2014-03-31 DIAGNOSIS — N390 Urinary tract infection, site not specified: Secondary | ICD-10-CM

## 2014-03-31 DIAGNOSIS — Z331 Pregnant state, incidental: Secondary | ICD-10-CM

## 2014-03-31 DIAGNOSIS — Z3490 Encounter for supervision of normal pregnancy, unspecified, unspecified trimester: Secondary | ICD-10-CM | POA: Insufficient documentation

## 2014-03-31 DIAGNOSIS — B379 Candidiasis, unspecified: Secondary | ICD-10-CM

## 2014-03-31 LAB — POCT URINALYSIS DIPSTICK
BILIRUBIN UA: NEGATIVE
Glucose, UA: NEGATIVE
KETONES UA: NEGATIVE
Nitrite, UA: NEGATIVE
PH UA: 6.5
Spec Grav, UA: 1.02
Urobilinogen, UA: 1

## 2014-03-31 LAB — POCT UA - MICROSCOPIC ONLY: WBC, Ur, HPF, POC: >20

## 2014-03-31 LAB — POCT URINE PREGNANCY: Preg Test, Ur: POSITIVE

## 2014-03-31 MED ORDER — FLUCONAZOLE 150 MG PO TABS: 150.0000 mg | ORAL_TABLET | Freq: Once | ORAL | Status: DC

## 2014-03-31 NOTE — Progress Notes (Addendum)
   Subjective:    Patient ID: Heather Savage, female    DOB: 04/29/1990, 24 y.o.   MRN: [id number]  HPI 24 year old female presents for same day appointment for hospital follow-up. Amharic Phone interpreter ID # [id number] used during visit.  1) Hospital follow up  Patient reports that she's recently been experiencing dysuria, and urinary urgency and frequency.  She was seen the hospital [facility] and was diagnosed with urinary tract infection.  She was also found to be approximately [redacted] weeks pregnant.  Patient seen today for follow-up. She is currently on amoxicillin for UTI.  Additionally, patient states that she does not desire this pregnancy.  No reports of fever, chills. She does note some lower abdominal discomfort. Patient also notes some white vaginal discharge.  Review of Systems Per HPI    Objective:   Physical Exam Filed Vitals:   03/31/14 1358  BP: 104/69  Pulse: 65  Temp: 97.8 F (36.6 C)   Exam: General: well appearing female in NAD.  Cardiovascular: RRR. No murmurs, rubs, or gallops. Respiratory: CTAB. No rales, rhonchi, or wheeze. Abdomen: soft, mild tenderness in the suprapubic region. Nondistended. No palpable organomegaly.    Assessment & Plan:  See Problem List

## 2014-03-31 NOTE — Assessment & Plan Note (Signed)
Patient currently pregnant and expresses that she does not desire pregnancy. I discussed this with attending Dr. Gwendolyn GrantWalden.  Apparently we do not have any resources in [place]. I gave her the [place] [facility] [place].

## 2014-03-31 NOTE — Patient Instructions (Addendum)
It was nice to see you today.  Please finish your antibiotic course for your UTI. Additionally, you have a yeast infection based on your urine specimen.  Please contact [facility] regarding your pregnancy.  We do not have the desired services in [place]. Contact: [address] [place], [place] [address] p: [phone]  Take care  Dr. Adriana Simasook

## 2014-03-31 NOTE — Assessment & Plan Note (Signed)
Yeast seen on urinalysis today. Will treat with Diflucan.

## 2014-03-31 NOTE — Assessment & Plan Note (Signed)
I advised the patient to complete her course of amoxicillin.

## 2014-03-31 NOTE — Addendum Note (Signed)
Addended by: SwazilandJORDAN, Sean Macwilliams on: 03/31/2014 05:53 PM   Modules accepted: Orders

## 2014-04-08 ENCOUNTER — Ambulatory Visit (INDEPENDENT_AMBULATORY_CARE_PROVIDER_SITE_OTHER): Payer: BLUE CROSS/BLUE SHIELD | Admitting: Family Medicine

## 2014-04-08 ENCOUNTER — Other Ambulatory Visit (HOSPITAL_COMMUNITY)
Admission: RE | Admit: 2014-04-08 | Discharge: 2014-04-08 | Disposition: A | Discharge status: Home / Self Care | Payer: BLUE CROSS/BLUE SHIELD | Source: Ambulatory Visit | Attending: Family Medicine | Admitting: Family Medicine

## 2014-04-08 VITALS — BP 104/70 | HR 73 | Temp 97.9°F | Wt 135.4 lb

## 2014-04-08 DIAGNOSIS — Z113 Encounter for screening for infections with a predominantly sexual mode of transmission: Secondary | ICD-10-CM | POA: Insufficient documentation

## 2014-04-08 DIAGNOSIS — Z202 Contact with and (suspected) exposure to infections with a predominantly sexual mode of transmission: Secondary | ICD-10-CM

## 2014-04-08 DIAGNOSIS — N390 Urinary tract infection, site not specified: Secondary | ICD-10-CM

## 2014-04-08 DIAGNOSIS — R42 Dizziness and giddiness: Secondary | ICD-10-CM | POA: Insufficient documentation

## 2014-04-08 DIAGNOSIS — Z20828 Contact with and (suspected) exposure to other viral communicable diseases: Secondary | ICD-10-CM

## 2014-04-08 DIAGNOSIS — L298 Other pruritus: Secondary | ICD-10-CM

## 2014-04-08 DIAGNOSIS — R1013 Epigastric pain: Secondary | ICD-10-CM

## 2014-04-08 DIAGNOSIS — R112 Nausea with vomiting, unspecified: Secondary | ICD-10-CM

## 2014-04-08 DIAGNOSIS — N898 Other specified noninflammatory disorders of vagina: Secondary | ICD-10-CM

## 2014-04-08 DIAGNOSIS — R300 Dysuria: Secondary | ICD-10-CM

## 2014-04-08 DIAGNOSIS — R319 Hematuria, unspecified: Secondary | ICD-10-CM

## 2014-04-08 LAB — POCT UA - MICROSCOPIC ONLY

## 2014-04-08 LAB — POCT URINALYSIS DIPSTICK
BILIRUBIN UA: NEGATIVE
Glucose, UA: NEGATIVE
Nitrite, UA: NEGATIVE
PROTEIN UA: NEGATIVE
Urobilinogen, UA: 0.2
pH, UA: 5.5

## 2014-04-08 LAB — POCT WET PREP (WET MOUNT): CLUE CELLS WET PREP WHIFF POC: NEGATIVE

## 2014-04-08 LAB — GLUCOSE, CAPILLARY
Glucose-Capillary: 107 mg/dL — ABNORMAL HIGH (ref 70–99)
Glucose-Capillary: 99 mg/dL (ref 70–99)

## 2014-04-08 MED ORDER — FLUCONAZOLE 150 MG PO TABS: 150.0000 mg | ORAL_TABLET | Freq: Once | ORAL | Status: DC

## 2014-04-08 MED ORDER — ONDANSETRON 4 MG PO TBDP: 4.0000 mg | ORAL_TABLET | Freq: Three times a day (TID) | ORAL | Status: DC | PRN

## 2014-04-08 MED ORDER — NITROFURANTOIN MONOHYD MACRO 100 MG PO CAPS: 100.0000 mg | ORAL_CAPSULE | Freq: Two times a day (BID) | ORAL | Status: DC

## 2014-04-08 MED ORDER — OMEPRAZOLE 40 MG PO CPDR: 40.0000 mg | DELAYED_RELEASE_CAPSULE | Freq: Every day | ORAL | Status: DC

## 2014-04-08 NOTE — Patient Instructions (Signed)
Take omeprazole daily at night for 14 days. Avoid sodas, caffeine, or foods that irritate your stomach. Take nitrofurantoin for 1 week twice daily. Take zofran as needed for your nausea. Take fluconazole for your vaginal itching. We are getting labs today. I will call if not normal. Be sure to stay hydrated as you are likely dehydrated. Increase fiber in your diet due to constipation. Follow up in 2-3 days if unable to stay hydrated, or seek immediate care if severe pain, fainting, or other concerns.  Best,  Leona SingletonMaria T Royale Swamy, MD

## 2014-04-08 NOTE — Assessment & Plan Note (Addendum)
Worsened with standing, borderline blood pressure in clinic, no syncope, most likely due to very poor hydration currently. -Discussed more aggressively hydrating. -Follow-up in 1-2 weeks if not improving, 2-3 days if unable to stay hydrated or sooner as needed. At f/u, could consider CMET and lipase. -Zofran to help with nausea

## 2014-04-08 NOTE — Progress Notes (Signed)
Patient ID: Heather BostonFiyery M Shuford, female   DOB: 02/02/1990, 24 y.o.   MRN: [id number] Subjective:   CC: Vomiting.  HPI:   Patient presents to same-day clinic for vomiting and dizziness. Amharic Phone interpreter used.  She reports 3 days of epigastric burning abdominal pain with any food intake, radiating into her chest, with 2 episodes of nonbloody vomiting. She denies dyspnea, dizziness, or syncope. She also denies diarrhea, reporting instead 2 days of constipation. For the past 2 days, she has felt lightheaded with standing. She reports definite decreased intake of food and liquids, drinking about one bottle of water per day and a small amount of juice.   She also reports mild dysuria at her urethra. She denies any concern for STD but has had some yellowish itchy vaginal discharge with no odor. She is completing the last day of amoxicillin for a recent UTI and has also taken 1 dose of Diflucan.  She denies fevers or chills. She was recently diagnosed with pregnancy that she does not desire but has not yet contacted [facility]. She also denies aggravating or alleviating factors, sick contacts, cough, congestion, rhinorrhea, or dyspareunia. Denies bloody stools or hematemesis.  Review of Systems - Per HPI.   PMH - history of amenorrhea, hearing loss, pelvic pain SH: Does not desire pregnancy    Objective:  Physical Exam BP 104/70 mmHg  Pulse 73  Temp(Src) 97.9 F (36.6 C) (Oral)  Wt 135 lb 6 oz (61.406 kg)  LMP 01/30/2014 GEN: NAD, mildly tired-appearing Cardiovascular: regular rate and rhythm, no m/r/g, 2+ B radial pulses Abdomen: soft, nontender, nondistended, no suprapubic or epigastric tenderness PULM: Normal effort GU: Thick mucopurulent discharge on cervix, no cervical motion tenderness, uterus and adnexa normal size and mobility, normal external genitalia Extremities: No lower extremity edema or calf tenderness SKIN: No tenting HEENT: AT/[place], sclera clear, EOMI, o/p  clear, MMM, TMs clear bilaterally     Assessment:     Heather Savage is a 24 y.o. female here for dizziness, vomiting, epigastric pain.    Plan:     # See problem list and after visit summary for problem-specific plans. - For constipation, discussed increasing fiber intake  # Health Maintenance:  - HIV and RPR testing today  Follow-up: Follow up in 2-3 days PRN to f/u hydration.   Leona SingletonMaria T Jacelyn Cuen, MD [facility]

## 2014-04-08 NOTE — Assessment & Plan Note (Addendum)
Nausea and vomiting may stem from current pregnancy. She is also having new epigastric burning abdominal pain with food intake that sounds like GERD. Abdomen nontender on exam. Moist mucous membranes and no skin tenting. CBG given constellation of symptoms>>107. -Omeprazole daily at bedtime 2 weeks. Avoid aggravating foods or drinks. -Zofran when necessary nausea. -Increase hydration as dizziness is likely due to dehydration.

## 2014-04-08 NOTE — Assessment & Plan Note (Signed)
Recent UTI with continued dysuria despite amoxicillin. No fevers or chills. Some vaginal itching and increased discharge. Concern for recurrent yeast infection. -Urinalysis and culture today. -Switch to Macrobid 7 days. -Empiric Diflucan 150 mg by mouth 1 -Repeat wet prep, test GC and chlamydia, also will test HIV and RPR for screening

## 2014-04-09 ENCOUNTER — Encounter: Payer: Self-pay | Admitting: *Deleted

## 2014-04-09 ENCOUNTER — Telehealth: Payer: Self-pay | Admitting: Family Medicine

## 2014-04-09 DIAGNOSIS — R1013 Epigastric pain: Secondary | ICD-10-CM

## 2014-04-09 LAB — HIV ANTIBODY (ROUTINE TESTING W REFLEX): HIV 1&2 Ab, 4th Generation: NONREACTIVE

## 2014-04-09 LAB — URINE CULTURE
Colony Count: NO GROWTH
ORGANISM ID, BACTERIA: NO GROWTH

## 2014-04-09 LAB — RPR

## 2014-04-09 LAB — GC/CHLAMYDIA PROBE AMP (~~LOC~~) NOT AT ARMC
Chlamydia: NEGATIVE
Neisseria Gonorrhea: NEGATIVE

## 2014-04-09 MED ORDER — FLUCONAZOLE 150 MG PO TABS: 150.0000 mg | ORAL_TABLET | Freq: Once | ORAL | Status: DC

## 2014-04-09 NOTE — Telephone Encounter (Signed)
Spoke with Victorino DikeJennifer at [facility] and ultrasound is in wrong.  It needs to be CPT [phone] and [phone].  I will call after lunch to schedule once this is put in. [facility] Deland[facility]azmin Hartsell,CMA

## 2014-04-09 NOTE — Telephone Encounter (Signed)
Orders placed. Thank you.  Leona SingletonMaria T Ela Moffat, MD

## 2014-04-09 NOTE — Telephone Encounter (Signed)
Called to notify patient the urine showed signs of likely dehydration and infection (blood, oxalate crystals). Could be kidney stone though much less likely. Be sure to hydrate very well. Take medications as prescribed (abx, Zofran). The wet prep showed some yeast. Be sure to take the diflucan for this. Sending in 1 extra dose to take after abx if continued itching. HIV, RPR, GC and Chlamydia negative. Offered interpreter to call again if needed but she stated she understood and declined interpreter. Reiterated to f/u 1 week if not feeling better.     Heather SingletonMaria T Zidan Helget, MD

## 2014-04-09 NOTE — Telephone Encounter (Signed)
Blue Team, this pt was seen yesterday in [facility] clinic. She needs OB ultrasound to confirm intrauterine pregnancy and rule out ectopic as cause for persistent pain (though epigastric makes ectopic unlikely) and nausea/vomiting. Please help arrange in the next 1-2days. I have notified patient of this.  Heather SingletonMaria T Amelie Caracci, MD

## 2014-04-09 NOTE — Addendum Note (Signed)
Addended by: Simone CuriaHEKKEKANDAM, Chisum Habenicht T on: 04/09/2014 12:33 PM   Modules accepted: Orders

## 2014-04-09 NOTE — Telephone Encounter (Signed)
[employer] interpreter ID# [id number].  appt made for 04/13/14 but pt is unable to make it [facility].  Nothing available on Monday.  Will check with MD. Burnard HawthorneJazmin Morghan Kester,CMA

## 2014-04-09 NOTE — Progress Notes (Signed)
Prior Authorization received from [facility] for Ondansetron ODT.  PA form placed in provider box for completion. Clovis PuMartin, Yoana Staib L, RN

## 2014-04-11 ENCOUNTER — Encounter: Payer: Self-pay | Admitting: Family Medicine

## 2014-04-12 ENCOUNTER — Telehealth: Payer: Self-pay | Admitting: Family Medicine

## 2014-04-12 NOTE — Telephone Encounter (Signed)
Pt called and [facility] and rescheduled it for 04-15-14.  [facility] cannot [facility] unless we have a signed release.  [facility].  Celestine Prim,CMA

## 2014-04-12 NOTE — Telephone Encounter (Signed)
You are not releasing any record to [facility], this is patient is based on mutual patient care. Otherwise contact patient directly and check with her. Please contact patient and check if she already had U/S done. Thanks.

## 2014-04-12 NOTE — Telephone Encounter (Signed)
Please advise patient to come see us soon or  go to the ED if having concern with her pregnancy.

## 2014-04-12 NOTE — Telephone Encounter (Signed)
I tried calling patient, message left by the specific interpreter [phone]. She is advised to call if still having issue with her pregnancy.

## 2014-04-12 NOTE — Telephone Encounter (Signed)
I don't know if this is something that we can call and ask them.  She didn't sign a ROI for us to contact them and they will likely ask for this before they hand out any information. Jazmin Hartsell,CMA

## 2014-04-12 NOTE — Telephone Encounter (Signed)
Spoke with Dr Lum BabeEniola who agrees we should contact [facility] to close the [facility] and find out that US was done and did NOT show ectopic. Then [facility]. Blue Team let me know outcome of that convo. Thank you.  Leona SingletonMaria T Taeler Winning, MD

## 2014-04-13 ENCOUNTER — Ambulatory Visit (HOSPITAL_COMMUNITY): Payer: BLUE CROSS/BLUE SHIELD

## 2014-04-15 ENCOUNTER — Ambulatory Visit (HOSPITAL_COMMUNITY)
Admission: RE | Admit: 2014-04-15 | Discharge: 2014-04-15 | Disposition: A | Discharge status: Home / Self Care | Payer: BLUE CROSS/BLUE SHIELD | Source: Ambulatory Visit | Attending: Family Medicine | Admitting: Family Medicine

## 2014-04-15 ENCOUNTER — Other Ambulatory Visit: Payer: Self-pay | Admitting: Family Medicine

## 2014-04-15 DIAGNOSIS — R1013 Epigastric pain: Secondary | ICD-10-CM

## 2014-04-15 DIAGNOSIS — R109 Unspecified abdominal pain: Secondary | ICD-10-CM | POA: Insufficient documentation

## 2014-04-15 DIAGNOSIS — N8320 Unspecified ovarian cysts: Secondary | ICD-10-CM | POA: Insufficient documentation

## 2014-04-16 NOTE — Progress Notes (Signed)
PA form faxed to pt's insurance for review.  Gorman Safi L, RN  

## 2014-04-16 NOTE — Progress Notes (Signed)
Completed and will place in Tamika's box today. Malikye Reppond T Tierre Netto, MD  

## 2014-04-20 ENCOUNTER — Telehealth: Payer: Self-pay | Admitting: Family Medicine

## 2014-04-20 NOTE — Telephone Encounter (Addendum)
Called and let patient know US looked relatively normal with signs of potential recent miscarriage. She states [facility] and [facility]. Discussed with her to follow up if symptoms not resolving. She voiced understanding. She also stated she would like a visit to discuss possible exposure to herpes or other skin condition from her boyfriend and I let her know she could call appt line in AM for appt.  Leona SingletonMaria T Koden Hunzeker, MD

## 2014-08-20 ENCOUNTER — Ambulatory Visit: Payer: Self-pay | Admitting: Family Medicine

## 2014-08-23 ENCOUNTER — Ambulatory Visit: Payer: Self-pay | Admitting: Family Medicine

## 2015-02-22 ENCOUNTER — Other Ambulatory Visit (HOSPITAL_COMMUNITY)
Admission: RE | Admit: 2015-02-22 | Discharge: 2015-02-22 | Disposition: A | Discharge status: Home / Self Care | Payer: PRIVATE HEALTH INSURANCE | Source: Ambulatory Visit | Attending: Family Medicine | Admitting: Family Medicine

## 2015-02-22 ENCOUNTER — Ambulatory Visit (INDEPENDENT_AMBULATORY_CARE_PROVIDER_SITE_OTHER): Payer: PRIVATE HEALTH INSURANCE | Admitting: Family Medicine

## 2015-02-22 ENCOUNTER — Encounter: Payer: Self-pay | Admitting: Family Medicine

## 2015-02-22 VITALS — BP 113/73 | HR 69 | Temp 98.1°F | Wt 130.4 lb

## 2015-02-22 DIAGNOSIS — Z113 Encounter for screening for infections with a predominantly sexual mode of transmission: Secondary | ICD-10-CM | POA: Insufficient documentation

## 2015-02-22 DIAGNOSIS — N926 Irregular menstruation, unspecified: Secondary | ICD-10-CM | POA: Diagnosis not present

## 2015-02-22 DIAGNOSIS — R300 Dysuria: Secondary | ICD-10-CM

## 2015-02-22 DIAGNOSIS — N898 Other specified noninflammatory disorders of vagina: Secondary | ICD-10-CM | POA: Diagnosis not present

## 2015-02-22 DIAGNOSIS — B379 Candidiasis, unspecified: Secondary | ICD-10-CM

## 2015-02-22 LAB — POCT URINE PREGNANCY: Preg Test, Ur: NEGATIVE

## 2015-02-22 LAB — POCT WET PREP (WET MOUNT)
Clue Cells Wet Prep Whiff POC: NEGATIVE
WBC, Wet Prep HPF POC: >20

## 2015-02-22 LAB — POCT URINALYSIS DIP (MANUAL ENTRY)
BILIRUBIN UA: NEGATIVE
BILIRUBIN UA: NEGATIVE
GLUCOSE UA: NEGATIVE
Nitrite, UA: NEGATIVE
PROTEIN UA: NEGATIVE
Urobilinogen, UA: 0.2
pH, UA: 5

## 2015-02-22 MED ORDER — FLUCONAZOLE 150 MG PO TABS: 150.0000 mg | ORAL_TABLET | Freq: Once | ORAL | Status: DC

## 2015-02-22 NOTE — Assessment & Plan Note (Signed)
Wet prep consistent with yeast infection. Will prescribe diflucan.

## 2015-02-22 NOTE — Patient Instructions (Signed)

## 2015-02-22 NOTE — Addendum Note (Signed)
Addended by: Jennette Bill on: 02/22/2015 04:58 PM   Modules accepted: Orders

## 2015-02-22 NOTE — Progress Notes (Signed)
Subjective:  Heather Savage is a 25 y.o. female who presents [facility] with a chief complaint of vaginal discharge and dysuria.   HPI:  Vaginal Discharge Present for 1 week. Described as whitish and foul smelling.Increased vaginal itching. No fevers or chills. No abdominal pain. No medications tried. Has a history of fungal infections  Dysuria Also present for 1 week. No fevers or chills. No back pain. No nausea or vomiting. No medications tried.   STD Screening Patient requests STD screening.   ROS: Per HPI  PMH:  The following were reviewed and entered/updated in epic: Past Medical History  Diagnosis Date  . Hearing loss     right  . 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 Anemia     states has low blood count, no current meds.  . History of gastritis     no current med.  Marland Kitchen History of malaria 2012   Patient Active Problem List   Diagnosis Date Noted  . Orthostatic dizziness 04/08/2014  . Yeast infection 03/31/2014  . Health care maintenance 12/01/2013  . Complex ovarian cyst 09/18/2013  . Amenorrhea 09/18/2013  . Pelvic pain in female 08/14/2013  . Hearing loss 05/01/2011   Past Surgical History  Procedure Laterality Date  . Tympanoplasty  05/14/2011    Procedure: TYMPANOPLASTY;  Surgeon: Serena Colonel, MD;  Location: [facility];  Service: ENT;  Laterality: Left;  TYMPANOPLASYT AND  POSSIBLE OSSICULARPLASTY   . Scar revision  08/27/2011    Procedure: SCAR REVISION;  Surgeon: Louisa Second, MD;  Location: [facility];  Service: Plastics;  Laterality: N/A;  scar revision of forehead  . Tympanoplasty  10/10/2011    Procedure: TYMPANOPLASTY;  Surgeon: Serena Colonel, MD;  Location: [facility];  Service: ENT;  Laterality: Right;  . Tympanoplasty Right 06/30/2012    Procedure: REVISION RIGHT TYMPANOPLASTY;  Surgeon: Serena Colonel, MD;  Location: [facility];  Service: ENT;  Laterality: Right;     Objective:    Physical Exam: BP 113/73 mmHg  Pulse 69  Temp(Src) 98.1 F (36.7 C) (Oral)  Wt 130 lb 6.4 oz (59.149 kg)  LMP 01/16/2015 (Exact Date)  Gen: NAD, resting comfortably GI: Normal bowel sounds present. Soft, Nontender, Nondistended. GU: Normal external female genitalia. Scant amount of white discharge in vaginal canal. No CMT.   Results for orders placed or performed in visit on 02/22/15 (from the past 72 hour(s))  POCT urine pregnancy     Status: None   Collection Time: 02/22/15  2:28 PM  Result Value Ref Range   Preg Test, Ur Negative Negative  POCT urinalysis dipstick     Status: Abnormal   Collection Time: 02/22/15  2:42 PM  Result Value Ref Range   Color, UA yellow yellow   Clarity, UA clear clear   Glucose, UA negative negative   Bilirubin, UA negative negative   Ketones, POC UA negative negative   Spec Grav, UA >=1.030    Blood, UA moderate (A) negative   pH, UA 5.0    Protein Ur, POC negative negative   Urobilinogen, UA 0.2    Nitrite, UA Negative Negative   Leukocytes, UA Trace (A) Negative  POCT Wet Prep [place])     Status: Abnormal   Collection Time: 02/22/15  3:05 PM  Result Value Ref Range   Source Wet Prep POC VAG    WBC, Wet Prep HPF POC >20    Bacteria  Wet Prep HPF POC Moderate (A) None, Few, Too numerous to count   Clue Cells Wet Prep HPF POC None None, Too numerous to count   Clue Cells Wet Prep Whiff POC Negative Whiff    Yeast Wet Prep HPF POC Moderate    Trichomonas Wet Prep HPF POC NONE      Assessment/Plan:  Yeast infection Wet prep consistent with yeast infection. Will prescribe diflucan.   Dysuria UA not consistent with UTI. May be related to irritation from yeast infection. Will send for culture. If positive, will treat appropriately.  STD Screening GC/CT, HIV, and RPR sent today.   Katina Degree. Jimmey Ralph, MD [facility] Family Medicine Resident PGY-2 02/22/2015 4:13 PM

## 2015-02-23 LAB — CERVICOVAGINAL ANCILLARY ONLY
CHLAMYDIA, DNA PROBE: NEGATIVE
NEISSERIA GONORRHEA: NEGATIVE

## 2015-02-23 LAB — RPR

## 2015-02-23 LAB — HIV ANTIBODY (ROUTINE TESTING W REFLEX): HIV: NONREACTIVE

## 2015-02-24 ENCOUNTER — Telehealth: Payer: Self-pay | Admitting: Family Medicine

## 2015-02-24 ENCOUNTER — Encounter: Payer: Self-pay | Admitting: Family Medicine

## 2015-02-24 MED ORDER — CEPHALEXIN 500 MG PO CAPS: 500.0000 mg | ORAL_CAPSULE | Freq: Two times a day (BID) | ORAL | Status: AC

## 2015-02-24 NOTE — Telephone Encounter (Signed)
Called patient to inform of results. Will send in keflex to treat UTI. Discussed ways to avoid UTIs. Patient again asked about not having period. Told her that her pregnancy test here was negative and that she should schedule a follow up appointment if she is still not getting her periods.   Heather Savage. Jimmey Ralph, MD [facility] Family Medicine Resident PGY-2 02/24/2015 10:13 AM

## 2015-02-25 LAB — URINE CULTURE

## 2015-12-09 ENCOUNTER — Encounter (HOSPITAL_COMMUNITY): Payer: Self-pay | Admitting: *Deleted

## 2015-12-09 ENCOUNTER — Emergency Department (HOSPITAL_COMMUNITY)
Admission: EM | Admit: 2015-12-09 | Discharge: 2015-12-09 | Disposition: A | Discharge status: Home / Self Care | Payer: Medicaid Other | Attending: Emergency Medicine | Admitting: Emergency Medicine

## 2015-12-09 ENCOUNTER — Emergency Department (HOSPITAL_COMMUNITY): Payer: Medicaid Other

## 2015-12-09 DIAGNOSIS — Z3490 Encounter for supervision of normal pregnancy, unspecified, unspecified trimester: Secondary | ICD-10-CM

## 2015-12-09 DIAGNOSIS — Z3A08 8 weeks gestation of pregnancy: Secondary | ICD-10-CM | POA: Insufficient documentation

## 2015-12-09 DIAGNOSIS — O200 Threatened abortion: Secondary | ICD-10-CM | POA: Diagnosis not present

## 2015-12-09 DIAGNOSIS — R109 Unspecified abdominal pain: Secondary | ICD-10-CM | POA: Insufficient documentation

## 2015-12-09 DIAGNOSIS — O26891 Other specified pregnancy related conditions, first trimester: Secondary | ICD-10-CM

## 2015-12-09 DIAGNOSIS — O26899 Other specified pregnancy related conditions, unspecified trimester: Secondary | ICD-10-CM

## 2015-12-09 HISTORY — DX: Other specified pregnancy related conditions, unspecified trimester: O26.899

## 2015-12-09 HISTORY — DX: Unspecified blood type, rh negative: Z67.91

## 2015-12-09 LAB — URINALYSIS, ROUTINE W REFLEX MICROSCOPIC
BILIRUBIN URINE: NEGATIVE
Glucose, UA: NEGATIVE mg/dL
KETONES UR: NEGATIVE mg/dL
NITRITE: NEGATIVE
PROTEIN: NEGATIVE mg/dL
SPECIFIC GRAVITY, URINE: 1.021 (ref 1.005–1.030)
pH: 6.5 (ref 5.0–8.0)

## 2015-12-09 LAB — CBC WITH DIFFERENTIAL/PLATELET
BASOS PCT: 0 %
Basophils Absolute: 0 10*3/uL (ref 0.0–0.1)
EOS ABS: 0 10*3/uL (ref 0.0–0.7)
EOS PCT: 0 %
HCT: 31 % — ABNORMAL LOW (ref 36.0–46.0)
Hemoglobin: 11 g/dL — ABNORMAL LOW (ref 12.0–15.0)
LYMPHS ABS: 1.5 10*3/uL (ref 0.7–4.0)
Lymphocytes Relative: 22 %
MCH: 30.7 pg (ref 26.0–34.0)
MCHC: 35.5 g/dL (ref 30.0–36.0)
MCV: 86.6 fL (ref 78.0–100.0)
MONOS PCT: 4 %
Monocytes Absolute: 0.3 10*3/uL (ref 0.1–1.0)
Neutro Abs: 4.9 10*3/uL (ref 1.7–7.7)
Neutrophils Relative %: 74 %
PLATELETS: 219 10*3/uL (ref 150–400)
RBC: 3.58 MIL/uL — AB (ref 3.87–5.11)
RDW: 12.3 % (ref 11.5–15.5)
WBC: 6.7 10*3/uL (ref 4.0–10.5)

## 2015-12-09 LAB — URINE MICROSCOPIC-ADD ON

## 2015-12-09 LAB — COMPREHENSIVE METABOLIC PANEL
ALK PHOS: 33 U/L — AB (ref 38–126)
ALT: 11 U/L — AB (ref 14–54)
AST: 20 U/L (ref 15–41)
Albumin: 3.6 g/dL (ref 3.5–5.0)
Anion gap: 9 (ref 5–15)
BUN: 6 mg/dL (ref 6–20)
CALCIUM: 8.9 mg/dL (ref 8.9–10.3)
CHLORIDE: 107 mmol/L (ref 101–111)
CO2: 19 mmol/L — ABNORMAL LOW (ref 22–32)
CREATININE: 0.43 mg/dL — AB (ref 0.44–1.00)
GFR calc Af Amer: >60 mL/min (ref >60)
Glucose, Bld: 88 mg/dL (ref 65–99)
Potassium: 3.4 mmol/L — ABNORMAL LOW (ref 3.5–5.1)
Sodium: 135 mmol/L (ref 135–145)
Total Bilirubin: 0.5 mg/dL (ref 0.3–1.2)
Total Protein: 6.2 g/dL — ABNORMAL LOW (ref 6.5–8.1)

## 2015-12-09 LAB — WET PREP, GENITAL
Clue Cells Wet Prep HPF POC: NONE SEEN
Sperm: NONE SEEN
Trich, Wet Prep: NONE SEEN
Yeast Wet Prep HPF POC: NONE SEEN

## 2015-12-09 LAB — HCG, QUANTITATIVE, PREGNANCY: HCG, BETA CHAIN, QUANT, S: 206726 m[IU]/mL — AB (ref <5)

## 2015-12-09 LAB — ABO/RH
ABO/RH(D): O NEG
Antibody Screen: NEGATIVE

## 2015-12-09 MED ORDER — PRENATAL VITAMINS 28-0.8 MG PO TABS: 1.0000 | ORAL_TABLET | Freq: Every day | ORAL | 1 refills | Status: DC

## 2015-12-09 MED ORDER — CEPHALEXIN 500 MG PO CAPS: 500.0000 mg | ORAL_CAPSULE | Freq: Two times a day (BID) | ORAL | 0 refills | Status: DC

## 2015-12-09 NOTE — Discharge Instructions (Signed)
RETURN IMMEDIATELY IF YOU HAVE ANY VAGINAL BLEEDING

## 2015-12-09 NOTE — ED Provider Notes (Signed)
[facility]-EMERGENCY DEPT Provider Note   CSN: [id number] Arrival date & time: 12/09/15  1248     History   Chief Complaint Chief Complaint  Patient presents with  . Abdominal Pain    [redacted] weeks pregnant    HPI Heather Savage is a 25 y.o. female.  25 year old female G2P0010 at approx 8 weeks by LMP who p/w abd pain. History obtained w/ assistance of patient's husband. The patient was seen at an outside hospital ER a few weeks ago for dental pain. At that time she was found to have a positive pregnancy test and was given Augmentin to treat pyuria and dental infection. She only took a few days of the medication because she was scared that it might hurt the baby. She reports 1-2 weeks of intermittent lower abdominal pain. She has had associated nausea but no vomiting or fevers. No vaginal bleeding or discharge, no urinary symptoms. They have had difficulty scheduling an appointment with an OB/GYN and finally found one for December.   The history is provided by the patient and the spouse. The history is limited by a language barrier.  Abdominal Pain      Past Medical History:  Diagnosis Date  . Anemia    states has low blood count, no current meds.  Marland Kitchen Hearing loss    right  . History of gastritis    no current med.  Marland Kitchen History of malaria 2012  . Rh negative, maternal   . Tympanic membrane perforation 05/2012   right    Patient Active Problem List   Diagnosis Date Noted  . Orthostatic dizziness 04/08/2014  . Yeast infection 03/31/2014  . Health care maintenance 12/01/2013  . Complex ovarian cyst 09/18/2013  . Amenorrhea 09/18/2013  . Pelvic pain in female 08/14/2013  . Hearing loss 05/01/2011    Past Surgical History:  Procedure Laterality Date  . SCAR REVISION  08/27/2011   Procedure: SCAR REVISION;  Surgeon: Louisa Second, MD;  Location: [facility];  Service: Plastics;  Laterality: N/A;  scar revision of forehead  . TYMPANOPLASTY  05/14/2011   Procedure: TYMPANOPLASTY;  Surgeon: Serena Colonel, MD;  Location: [facility];  Service: ENT;  Laterality: Left;  TYMPANOPLASYT AND  POSSIBLE OSSICULARPLASTY   . TYMPANOPLASTY  10/10/2011   Procedure: TYMPANOPLASTY;  Surgeon: Serena Colonel, MD;  Location: [facility] OR;  Service: ENT;  Laterality: Right;  . TYMPANOPLASTY Right 06/30/2012   Procedure: REVISION RIGHT TYMPANOPLASTY;  Surgeon: Serena Colonel, MD;  Location: [facility];  Service: ENT;  Laterality: Right;    OB History    No data available       Home Medications    Prior to Admission medications   Medication Sig Start Date End Date Taking? Authorizing Provider  cephALEXin (KEFLEX) 500 MG capsule Take 1 capsule (500 mg total) by mouth 2 (two) times daily. 12/09/15   Laurence Spates, MD  fluconazole (DIFLUCAN) 150 MG tablet Take 1 tablet (150 mg total) by mouth once. 02/22/15   Ardith Dark, MD  Prenatal Vit-Fe Fumarate-FA (PRENATAL VITAMINS) 28-0.8 MG TABS Take 1 tablet by mouth daily. 12/09/15   Laurence Spates, MD    Family History No family history on file.  Social History Social History  Substance Use Topics  . Smoking status: Never Smoker  . Smokeless tobacco: Never Used  . Alcohol use No     Allergies   Patient has no known allergies.   Review of Systems Review of  Systems  Gastrointestinal: Positive for abdominal pain.     Physical Exam Updated Vital Signs BP 96/61   Pulse 67   Temp 97.7 F (36.5 C) (Oral)   Resp 16   Ht 5\' 6"  (1.676 m)   Wt 130 lb (59 kg)   LMP 10/08/2015   SpO2 100%   BMI 20.98 kg/m   Physical Exam  Constitutional: She is oriented to person, place, and time. She appears well-developed and well-nourished. No distress.  HENT:  Head: Normocephalic and atraumatic.  Moist mucous membranes  Eyes: Conjunctivae are normal. Pupils are equal, round, and reactive to light.  Neck: Neck supple.  Cardiovascular: Normal rate, regular rhythm and normal heart  sounds.   No murmur heard. Pulmonary/Chest: Effort normal and breath sounds normal.  Abdominal: Soft. Bowel sounds are normal. She exhibits no distension. There is no tenderness.  Genitourinary:  Genitourinary Comments: Small amount of white physiologic d/c in vag vault; no cervical motion or adnexal tenderness  Musculoskeletal: She exhibits no edema.  Neurological: She is alert and oriented to person, place, and time.  Fluent speech  Skin: Skin is warm and dry. No rash noted.  Psychiatric: She has a normal mood and affect. Judgment normal.  Nursing note and vitals reviewed.  Chaperone was present during exam.   ED Treatments / Results  Labs (all labs ordered are listed, but only abnormal results are displayed) Labs Reviewed  WET PREP, GENITAL - Abnormal; Notable for the following:       Result Value   WBC, Wet Prep HPF POC MANY (*)    All other components within normal limits  HCG, QUANTITATIVE, PREGNANCY - Abnormal; Notable for the following:    hCG, Beta Chain, [place] Longest 206,726 (*)    All other components within normal limits  URINALYSIS, ROUTINE W REFLEX MICROSCOPIC (NOT AT [facility]) - Abnormal; Notable for the following:    Hgb urine dipstick TRACE (*)    Leukocytes, UA SMALL (*)    All other components within normal limits  URINE MICROSCOPIC-ADD ON - Abnormal; Notable for the following:    Squamous Epithelial / LPF 6-30 (*)    Bacteria, UA FEW (*)    All other components within normal limits  COMPREHENSIVE METABOLIC PANEL - Abnormal; Notable for the following:    Potassium 3.4 (*)    CO2 19 (*)    Creatinine, Ser 0.43 (*)    Total Protein 6.2 (*)    ALT 11 (*)    Alkaline Phosphatase 33 (*)    All other components within normal limits  CBC WITH DIFFERENTIAL/PLATELET - Abnormal; Notable for the following:    RBC 3.58 (*)    Hemoglobin 11.0 (*)    HCT 31.0 (*)    All other components within normal limits  URINE CULTURE  ABO/RH  GC/CHLAMYDIA PROBE AMP ([place])  NOT AT [facility]    EKG  EKG Interpretation None       Radiology US Ob Comp Less 14 Wks  Result Date: 12/09/2015 CLINICAL DATA:  Lower abdominal pain for 2 weeks. Patient is 8 weeks and 6 days pregnant based on her last menstrual period. Quantitative beta HCG level is 206,726. EXAM: OBSTETRIC <14 WK [place] AND TRANSVAGINAL OB US TECHNIQUE: Both transabdominal and transvaginal ultrasound examinations were performed for complete evaluation of the gestation as well as the maternal uterus, adnexal regions, and pelvic cul-de-sac. Transvaginal technique was performed to assess early pregnancy. COMPARISON:  None. FINDINGS: Intrauterine gestational sac: Single Yolk sac:  Visualized. Embryo:  Visualized. Cardiac Activity: Visualized. Heart Rate: 147  bpm CRL:  23.7  mm   9 w   0 d                  US EDC: 07/13/2016 Subchorionic hemorrhage:  None visualized. Maternal uterus/adnexae: No uterine masses. Cervix is closed. No adnexal masses. Ovaries are unremarkable. No pelvic free fluid. IMPRESSION: 1. Single live intrauterine pregnancy with a measured gestational age of [redacted] weeks, concordant with the estimated gestational stage based on the last menstrual period. 2. No pregnancy complication.  No maternal abnormalities. Electronically Signed   By: Amie Portlandavid  Ormond M.D.   On: 12/09/2015 16:32   [place] Ob Transvaginal  Result Date: 12/09/2015 CLINICAL DATA:  Lower abdominal pain for 2 weeks. Patient is 8 weeks and 6 days pregnant based on her last menstrual period. Quantitative beta HCG level is 206,726. EXAM: OBSTETRIC <14 WK US AND TRANSVAGINAL OB US TECHNIQUE: Both transabdominal and transvaginal ultrasound examinations were performed for complete evaluation of the gestation as well as the maternal uterus, adnexal regions, and pelvic cul-de-sac. Transvaginal technique was performed to assess early pregnancy. COMPARISON:  None. FINDINGS: Intrauterine gestational sac: Single Yolk sac:  Visualized. Embryo:  Visualized. Cardiac  Activity: Visualized. Heart Rate: 147  bpm CRL:  23.7  mm   9 w   0 d                  US EDC: 07/13/2016 Subchorionic hemorrhage:  None visualized. Maternal uterus/adnexae: No uterine masses. Cervix is closed. No adnexal masses. Ovaries are unremarkable. No pelvic free fluid. IMPRESSION: 1. Single live intrauterine pregnancy with a measured gestational age of [redacted] weeks, concordant with the estimated gestational stage based on the last menstrual period. 2. No pregnancy complication.  No maternal abnormalities. Electronically Signed   By: Amie Portlandavid  Ormond M.D.   On: 12/09/2015 16:32    Procedures Procedures (including critical care time)  Medications Ordered in ED Medications - No data to display   Initial Impression / Assessment and Plan / ED Course  I have reviewed the triage vital signs and the nursing notes.  Pertinent labs & imaging results that were available during my care of the patient were reviewed by me and considered in my medical decision making (see chart for details).  Clinical Course     Patient with early pregnancy, not yet had an ultrasound, presents with 1-2 weeks of intermittent lower abdominal pain. She was well-appearing on exam with normal vital signs. No focal abdominal tenderness. She had a small amount of discharge are pelvic exam, no concerning findings. Obtained labs which are notable for hCG 206,000, UA with small leukocytes, moderate amount of squamous epithelial cells, some bacteria and wbc's. U culture sent. Ultrasound shows normal intrauterine pregnancy at 9 weeks with normal fetal heart tones. No abnormalities noted. Her lab work is reassuring and she has no bleeding on pelvic exam no reports of bleeding. Because she has had no bleeding or trauma, I do not feel she needs Rhogam; she is O-. Stress importance of follow-up with OB/GYN and provided with women's clinic information. Counseled on routine prenatal care including Tylenol only for pain, prenatal vitamins, and  avoidance of alcohol/smoking/drugs. Provided with Keflex because I did review her chart which showed a urine culture positive for Escherichia coli. Discussed return precautions including any bleeding. Patient and husband voice understanding of plan and patient was discharged in satisfactory condition.  Final Clinical Impressions(s) / ED Diagnoses  Final diagnoses:  Abdominal pain in pregnancy, first trimester  Intrauterine pregnancy  Threatened miscarriage    New Prescriptions New Prescriptions   CEPHALEXIN (KEFLEX) 500 MG CAPSULE    Take 1 capsule (500 mg total) by mouth 2 (two) times daily.   PRENATAL VIT-FE FUMARATE-FA (PRENATAL VITAMINS) 28-0.8 MG TABS    Take 1 tablet by mouth daily.     Laurence Spatesachel Morgan Jaedyn Marrufo, MD 12/09/15 [phone]

## 2015-12-09 NOTE — ED Notes (Signed)
Pt ambulated out of ED

## 2015-12-09 NOTE — ED Triage Notes (Addendum)
Pt c/o lower abdominal pain x 2 weeks, worse this week.  Denies vaginal discharge/bleeding.  States [redacted] weeks pregnant.  Pt states she is RH neg.  Speaks Amharic.

## 2015-12-10 LAB — URINE CULTURE: Culture: <10000 — AB

## 2015-12-12 LAB — GC/CHLAMYDIA PROBE AMP (~~LOC~~) NOT AT ARMC
Chlamydia: NEGATIVE
NEISSERIA GONORRHEA: NEGATIVE

## 2016-01-02 ENCOUNTER — Ambulatory Visit (INDEPENDENT_AMBULATORY_CARE_PROVIDER_SITE_OTHER): Payer: Medicaid Other | Admitting: Certified Nurse Midwife

## 2016-01-02 ENCOUNTER — Encounter: Payer: Self-pay | Admitting: Certified Nurse Midwife

## 2016-01-02 VITALS — BP 116/75 | HR 88 | Wt 123.0 lb

## 2016-01-02 DIAGNOSIS — Z3491 Encounter for supervision of normal pregnancy, unspecified, first trimester: Secondary | ICD-10-CM | POA: Diagnosis not present

## 2016-01-02 DIAGNOSIS — Z3490 Encounter for supervision of normal pregnancy, unspecified, unspecified trimester: Secondary | ICD-10-CM

## 2016-01-02 DIAGNOSIS — O219 Vomiting of pregnancy, unspecified: Secondary | ICD-10-CM

## 2016-01-02 DIAGNOSIS — Z789 Other specified health status: Secondary | ICD-10-CM

## 2016-01-02 DIAGNOSIS — Z3492 Encounter for supervision of normal pregnancy, unspecified, second trimester: Secondary | ICD-10-CM

## 2016-01-02 MED ORDER — COMPLETENATE 29-1 MG PO CHEW: 1.0000 | CHEWABLE_TABLET | Freq: Every day | ORAL | Status: DC

## 2016-01-02 MED ORDER — DOXYLAMINE-PYRIDOXINE 10-10 MG PO TBEC: DELAYED_RELEASE_TABLET | ORAL | 4 refills | Status: DC

## 2016-01-02 NOTE — Progress Notes (Signed)
Subjective:    Heather Savage is being seen today for her first obstetrical visit.  This is a planned pregnancy. She is at 1432w5d gestation. Her obstetrical history is significant for non-english speaking. Relationship with FOB: significant other, living together. Patient does intend to breast feed. Pregnancy history fully reviewed.  The information documented in the HPI was reviewed and verified.  Menstrual History: OB History    Gravida Para Term Preterm AB Living   2       1     SAB TAB Ectopic Multiple Live Births   1               Patient's last menstrual period was 10/05/2015.    Past Medical History:  Diagnosis Date  . Anemia    states has low blood count, no current meds.  Marland Kitchen. Hearing loss    right  . History of gastritis    no current med.  Marland Kitchen. History of malaria 2012  . Rh negative, maternal   . Tympanic membrane perforation 05/2012   right    Past Surgical History:  Procedure Laterality Date  . SCAR REVISION  08/27/2011   Procedure: SCAR REVISION;  Surgeon: Louisa SecondGerald Truesdale, MD;  Location: [facility];  Service: Plastics;  Laterality: N/A;  scar revision of forehead  . TYMPANOPLASTY  05/14/2011   Procedure: TYMPANOPLASTY;  Surgeon: Serena ColonelJefry Rosen, MD;  Location: [facility];  Service: ENT;  Laterality: Left;  TYMPANOPLASYT AND  POSSIBLE OSSICULARPLASTY   . TYMPANOPLASTY  10/10/2011   Procedure: TYMPANOPLASTY;  Surgeon: Serena ColonelJefry Rosen, MD;  [facility];  Service: ENT;  Laterality: Right;  . TYMPANOPLASTY Right 06/30/2012   Procedure: REVISION RIGHT TYMPANOPLASTY;  Surgeon: Serena ColonelJefry Rosen, MD;  Location: [facility];  Service: ENT;  Laterality: Right;     (Not in a hospital admission) No Known Allergies  Social History  Substance Use Topics  . Smoking status: Never Smoker  . Smokeless tobacco: Never Used  . Alcohol use No    History reviewed. No pertinent family history.   Review of Systems Constitutional: negative for  weight loss Gastrointestinal: negative for vomiting Genitourinary:negative for genital lesions and vaginal discharge and dysuria Musculoskeletal:negative for back pain Behavioral/Psych: negative for abusive relationship, depression, illegal drug usage and tobacco use    Objective:    BP 116/75   Pulse 88   Wt 123 lb (55.8 kg)   LMP 10/05/2015   BMI 19.85 kg/m  General Appearance:    Alert, cooperative, no distress, appears stated age  Head:    Normocephalic, without obvious abnormality, atraumatic  Eyes:    PERRL, conjunctiva/corneas clear, EOM's intact, fundi    benign, both eyes  Ears:    Normal TM's and external ear canals, both ears  Nose:   Nares normal, septum midline, mucosa normal, no drainage    or sinus tenderness  Throat:   Lips, mucosa, and tongue normal; teeth and gums normal  Neck:   Supple, symmetrical, trachea midline, no adenopathy;    thyroid:  no enlargement/tenderness/nodules; no carotid   bruit or JVD  Back:     Symmetric, no curvature, ROM normal, no CVA tenderness  Lungs:     Clear to auscultation bilaterally, respirations unlabored  Chest Wall:    No tenderness or deformity   Heart:    Regular rate and rhythm, S1 and S2 normal, no murmur, rub   or gallop  Breast Exam:    No tenderness, masses, or  nipple abnormality  Abdomen:     Soft, non-tender, bowel sounds active all four quadrants,    no masses, no organomegaly  Genitalia:    Normal female without lesion, discharge or tenderness  Extremities:   Extremities normal, atraumatic, no cyanosis or edema  Pulses:   2+ and symmetric all extremities  Skin:   Skin color, texture, turgor normal, no rashes or lesions  Lymph nodes:   Cervical, supraclavicular, and axillary nodes normal  Neurologic:   CNII-XII intact, normal strength, sensation and reflexes    throughout          Cervix:   Long, thick, closed and posterior.  FHR: 158 by doppler     FH: less than U.  Lab Review Urine pregnancy test Labs reviewed  yes Radiologic studies reviewed yes Assessment:    Pregnancy at 1830w5d weeks   non-english speaking  Plan:      Prenatal vitamins.  Counseling provided regarding continued use of seat belts, cessation of alcohol consumption, smoking or use of illicit drugs; infection precautions i.e., influenza/TDAP immunizations, toxoplasmosis,CMV, parvovirus, listeria and varicella; workplace safety, exercise during pregnancy; routine dental care, safe medications, sexual activity, hot tubs, saunas, pools, travel, caffeine use, fish and methlymercury, potential toxins, hair treatments, varicose veins Weight gain recommendations per IOM guidelines reviewed: underweight/BMI< 18.5--> gain 28 - 40 lbs; normal weight/BMI 18.5 - 24.9--> gain 25 - 35 lbs; overweight/BMI 25 - 29.9--> gain 15 - 25 lbs; obese/BMI >30->gain  11 - 20 lbs Problem list reviewed and updated. FIRST/CF mutation testing/NIPT/QUAD SCREEN/fragile X/Ashkenazi Jewish population testing/Spinal muscular atrophy discussed: requested. Role of ultrasound in pregnancy discussed; fetal survey: requested. Amniocentesis discussed: not indicated. VBAC calculator score: VBAC consent form provided No orders of the defined types were placed in this encounter.  No orders of the defined types were placed in this encounter.   Follow up in 4 weeks. 50% of 30 min visit spent on counseling and coordination of care.

## 2016-01-02 NOTE — Addendum Note (Signed)
Addended by: Francene FindersJAMES, QUINETTA C on: 01/02/2016 05:17 PM   Modules accepted: Orders

## 2016-01-03 ENCOUNTER — Other Ambulatory Visit (HOSPITAL_COMMUNITY)
Admission: RE | Admit: 2016-01-03 | Discharge: 2016-01-03 | Disposition: A | Discharge status: Home / Self Care | Payer: Medicaid Other | Source: Ambulatory Visit | Attending: Obstetrics | Admitting: Obstetrics

## 2016-01-03 DIAGNOSIS — Z01419 Encounter for gynecological examination (general) (routine) without abnormal findings: Secondary | ICD-10-CM | POA: Insufficient documentation

## 2016-01-03 LAB — POCT URINALYSIS DIPSTICK
Bilirubin, UA: NEGATIVE
Blood, UA: 50
Glucose, UA: NEGATIVE
Ketones, UA: NEGATIVE
Leukocytes, UA: NEGATIVE
NITRITE UA: NEGATIVE
PROTEIN UA: NEGATIVE
SPEC GRAV UA: 1.015
UROBILINOGEN UA: NEGATIVE
pH, UA: 6

## 2016-01-03 NOTE — Addendum Note (Signed)
Addended by: Marya LandryFOSTER, Lunabelle Oatley D on: 01/03/2016 08:59 AM   Modules accepted: Orders

## 2016-01-04 ENCOUNTER — Other Ambulatory Visit: Payer: Self-pay | Admitting: Certified Nurse Midwife

## 2016-01-04 DIAGNOSIS — B373 Candidiasis of vulva and vagina: Secondary | ICD-10-CM

## 2016-01-04 DIAGNOSIS — B3731 Acute candidiasis of vulva and vagina: Secondary | ICD-10-CM

## 2016-01-04 LAB — CULTURE, OB URINE

## 2016-01-04 LAB — CYTOLOGY - PAP: DIAGNOSIS: NEGATIVE

## 2016-01-04 LAB — URINE CULTURE, OB REFLEX

## 2016-01-04 MED ORDER — TERCONAZOLE 0.8 % VA CREA: 1.0000 | TOPICAL_CREAM | Freq: Every day | VAGINAL | 0 refills | Status: DC

## 2016-01-05 ENCOUNTER — Telehealth: Payer: Self-pay

## 2016-01-05 ENCOUNTER — Other Ambulatory Visit: Payer: Self-pay | Admitting: Obstetrics

## 2016-01-05 DIAGNOSIS — K0262 Dental caries on smooth surface penetrating into dentin: Secondary | ICD-10-CM

## 2016-01-05 NOTE — Telephone Encounter (Signed)
Returned call and informed that pharmacy stated that rx is ready to be picked up, advised to call back if they have any issues.

## 2016-01-06 ENCOUNTER — Other Ambulatory Visit: Payer: Self-pay | Admitting: Certified Nurse Midwife

## 2016-01-06 LAB — NUSWAB VG+, CANDIDA 6SP
CANDIDA GLABRATA, NAA: NEGATIVE
CANDIDA KRUSEI, NAA: NEGATIVE
CANDIDA TROPICALIS, NAA: NEGATIVE
Candida albicans, NAA: POSITIVE — AB
Candida lusitaniae, NAA: NEGATIVE
Candida parapsilosis, NAA: NEGATIVE
Chlamydia trachomatis, NAA: NEGATIVE
Neisseria gonorrhoeae, NAA: NEGATIVE
Trich vag by NAA: NEGATIVE

## 2016-01-06 LAB — TOXASSURE SELECT 13 (MW), URINE

## 2016-01-09 LAB — TSH: TSH: 0.096 u[IU]/mL — AB (ref 0.450–4.500)

## 2016-01-09 LAB — OBSTETRIC PANEL, INCLUDING HIV
ANTIBODY SCREEN: NEGATIVE
BASOS: 0 %
Basophils Absolute: 0 10*3/uL (ref 0.0–0.2)
EOS (ABSOLUTE): 0 10*3/uL (ref 0.0–0.4)
EOS: 0 %
HEMATOCRIT: 33.6 % — AB (ref 34.0–46.6)
HEMOGLOBIN: 11.9 g/dL (ref 11.1–15.9)
HEP B S AG: NEGATIVE
HIV SCREEN 4TH GENERATION: NONREACTIVE
IMMATURE GRANULOCYTES: 0 %
Immature Grans (Abs): 0 10*3/uL (ref 0.0–0.1)
LYMPHS ABS: 1.5 10*3/uL (ref 0.7–3.1)
Lymphs: 22 %
MCH: 31.9 pg (ref 26.6–33.0)
MCHC: 35.4 g/dL (ref 31.5–35.7)
MCV: 90 fL (ref 79–97)
MONOS ABS: 0.3 10*3/uL (ref 0.1–0.9)
Monocytes: 4 %
NEUTROS ABS: 4.9 10*3/uL (ref 1.4–7.0)
NEUTROS PCT: 74 %
Platelets: 241 10*3/uL (ref 150–379)
RBC: 3.73 x10E6/uL — AB (ref 3.77–5.28)
RDW: 14.1 % (ref 12.3–15.4)
RH TYPE: NEGATIVE
RPR: NONREACTIVE
Rubella Antibodies, IGG: 9.87 index (ref >0.99)
WBC: 6.7 10*3/uL (ref 3.4–10.8)

## 2016-01-09 LAB — HEMOGLOBINOPATHY EVALUATION
HGB A: 97.6 % (ref 94.0–98.0)
HGB C: 0 %
HGB S: 0 %
Hemoglobin A2 Quantitation: 2.4 % (ref 0.7–3.1)
Hemoglobin F Quantitation: 0 % (ref 0.0–2.0)

## 2016-01-09 LAB — HEMOGLOBIN A1C
Est. average glucose Bld gHb Est-mCnc: 100 mg/dL
Hgb A1c MFr Bld: 5.1 % (ref 4.8–5.6)

## 2016-01-09 LAB — CYSTIC FIBROSIS MUTATION 97: Interpretation: NOT DETECTED

## 2016-01-09 LAB — VARICELLA ZOSTER ANTIBODY, IGG: VARICELLA: 368 {index} (ref >165)

## 2016-01-12 ENCOUNTER — Other Ambulatory Visit: Payer: Self-pay | Admitting: Certified Nurse Midwife

## 2016-01-12 DIAGNOSIS — R7989 Other specified abnormal findings of blood chemistry: Secondary | ICD-10-CM | POA: Insufficient documentation

## 2016-01-21 ENCOUNTER — Emergency Department (HOSPITAL_COMMUNITY)
Admission: EM | Admit: 2016-01-21 | Discharge: 2016-01-21 | Disposition: A | Discharge status: Home / Self Care | Payer: Medicaid Other | Attending: Emergency Medicine | Admitting: Emergency Medicine

## 2016-01-21 ENCOUNTER — Encounter (HOSPITAL_COMMUNITY): Payer: Self-pay | Admitting: Emergency Medicine

## 2016-01-21 DIAGNOSIS — O2311 Infections of bladder in pregnancy, first trimester: Secondary | ICD-10-CM | POA: Diagnosis not present

## 2016-01-21 DIAGNOSIS — Z3A14 14 weeks gestation of pregnancy: Secondary | ICD-10-CM | POA: Insufficient documentation

## 2016-01-21 DIAGNOSIS — R102 Pelvic and perineal pain: Secondary | ICD-10-CM | POA: Diagnosis not present

## 2016-01-21 DIAGNOSIS — O26891 Other specified pregnancy related conditions, first trimester: Secondary | ICD-10-CM | POA: Diagnosis present

## 2016-01-21 LAB — COMPREHENSIVE METABOLIC PANEL
ALBUMIN: 3.6 g/dL (ref 3.5–5.0)
ALK PHOS: 54 U/L (ref 38–126)
ALT: 35 U/L (ref 14–54)
AST: 35 U/L (ref 15–41)
Anion gap: 7 (ref 5–15)
BUN: <5 mg/dL — ABNORMAL LOW (ref 6–20)
CALCIUM: 8.8 mg/dL — AB (ref 8.9–10.3)
CO2: 23 mmol/L (ref 22–32)
CREATININE: 0.44 mg/dL (ref 0.44–1.00)
Chloride: 100 mmol/L — ABNORMAL LOW (ref 101–111)
GFR calc Af Amer: >60 mL/min (ref >60)
GFR calc non Af Amer: >60 mL/min (ref >60)
GLUCOSE: 116 mg/dL — AB (ref 65–99)
Potassium: 3.3 mmol/L — ABNORMAL LOW (ref 3.5–5.1)
SODIUM: 130 mmol/L — AB (ref 135–145)
Total Bilirubin: 0.3 mg/dL (ref 0.3–1.2)
Total Protein: 6.9 g/dL (ref 6.5–8.1)

## 2016-01-21 LAB — URINALYSIS, ROUTINE W REFLEX MICROSCOPIC
Bilirubin Urine: NEGATIVE
Glucose, UA: NEGATIVE mg/dL
Ketones, ur: NEGATIVE mg/dL
NITRITE: NEGATIVE
PH: 6 (ref 5.0–8.0)
Protein, ur: 100 mg/dL — AB
SPECIFIC GRAVITY, URINE: 1.003 — AB (ref 1.005–1.030)

## 2016-01-21 LAB — CBC
HCT: 31.6 % — ABNORMAL LOW (ref 36.0–46.0)
HEMOGLOBIN: 11.6 g/dL — AB (ref 12.0–15.0)
MCH: 31.9 pg (ref 26.0–34.0)
MCHC: 36.7 g/dL — AB (ref 30.0–36.0)
MCV: 86.8 fL (ref 78.0–100.0)
PLATELETS: 230 10*3/uL (ref 150–400)
RBC: 3.64 MIL/uL — ABNORMAL LOW (ref 3.87–5.11)
RDW: 13.5 % (ref 11.5–15.5)
WBC: 7.5 10*3/uL (ref 4.0–10.5)

## 2016-01-21 LAB — HCG, QUANTITATIVE, PREGNANCY: hCG, Beta Chain, Quant, S: 38727 m[IU]/mL — ABNORMAL HIGH (ref <5)

## 2016-01-21 MED ORDER — CEPHALEXIN 250 MG PO CAPS
500.0000 mg | ORAL_CAPSULE | Freq: Once | ORAL | Status: AC
  Administered 2016-01-21: 500 mg via ORAL
  Filled 2016-01-21: qty 2

## 2016-01-21 MED ORDER — CEPHALEXIN 500 MG PO CAPS: 500.0000 mg | ORAL_CAPSULE | Freq: Two times a day (BID) | ORAL | 0 refills | Status: DC

## 2016-01-21 MED ORDER — ACETAMINOPHEN 500 MG PO TABS
500.0000 mg | ORAL_TABLET | Freq: Once | ORAL | Status: AC
  Administered 2016-01-21: 500 mg via ORAL
  Filled 2016-01-21: qty 1

## 2016-01-21 MED ORDER — POTASSIUM CHLORIDE CRYS ER 20 MEQ PO TBCR
40.0000 meq | EXTENDED_RELEASE_TABLET | Freq: Once | ORAL | Status: AC
  Administered 2016-01-21: 40 meq via ORAL
  Filled 2016-01-21: qty 2

## 2016-01-21 NOTE — ED Provider Notes (Signed)
[facility]ENCY DEPT Provider Note   CSN: [id number] Arrival date & time: 01/21/16  1738     History   Chief Complaint Chief Complaint  Patient presents with  . Hematuria  . Vaginal Bleeding  . Routine Prenatal Visit    HPI Heather Savage is a 25 y.o. female.  HPI  25 year old female presents with hematuria, dysuria, and urinary frequency starting this morning. She states there is no vaginal bleeding, only hematuria. She is about [redacted] weeks pregnant. Patient states she has not had fever today nor vomiting. Some mild right low back pain. Has not taken anything for the pain. Is having lower abdominal pain.  Past Medical History:  Diagnosis Date  . Anemia    states has low blood count, no current meds.  Marland Kitchen. Hearing loss    right  . History of gastritis    no current med.  Marland Kitchen. History of malaria 2012  . Rh negative, maternal   . Tympanic membrane perforation 05/2012   right    Patient Active Problem List   Diagnosis Date Noted  . Low thyroid stimulating hormone (TSH) level 01/12/2016  . Supervision of normal pregnancy, antepartum 01/02/2016  . Language barrier 01/02/2016  . Orthostatic dizziness 04/08/2014  . Yeast infection 03/31/2014  . Health care maintenance 12/01/2013  . Complex ovarian cyst 09/18/2013  . Amenorrhea 09/18/2013  . Pelvic pain in female 08/14/2013  . Hearing loss 05/01/2011    Past Surgical History:  Procedure Laterality Date  . SCAR REVISION  08/27/2011   Procedure: SCAR REVISION;  Surgeon: Louisa SecondGerald Truesdale, MD;  Location: [facility];  Service: Plastics;  Laterality: N/A;  scar revision of forehead  . TYMPANOPLASTY  05/14/2011   Procedure: TYMPANOPLASTY;  Surgeon: Serena ColonelJefry Rosen, MD;  Location: [facility];  Service: ENT;  Laterality: Left;  TYMPANOPLASYT AND  POSSIBLE OSSICULARPLASTY   . TYMPANOPLASTY  10/10/2011   Procedure: TYMPANOPLASTY;  Surgeon: Serena ColonelJefry Rosen, MD;  [facility];  Service: ENT;  Laterality: Right;    . TYMPANOPLASTY Right 06/30/2012   Procedure: REVISION RIGHT TYMPANOPLASTY;  Surgeon: Serena ColonelJefry Rosen, MD;  Location: [facility];  Service: ENT;  Laterality: Right;    OB History    Gravida Para Term Preterm AB Living   2       1     SAB TAB Ectopic Multiple Live Births   1               Home Medications    Prior to Admission medications   Medication Sig Start Date End Date Taking? Authorizing Provider  cephALEXin (KEFLEX) 500 MG capsule Take 1 capsule (500 mg total) by mouth 2 (two) times daily. 01/21/16   Pricilla LovelessScott Marte Celani, MD  Doxylamine-Pyridoxine (DICLEGIS) 10-10 MG TBEC Take 1 tablet with breakfast and lunch.  Take 2 tablets at bedtime. 01/02/16   Rachelle A Denney, CNM  fluconazole (DIFLUCAN) 150 MG tablet Take 1 tablet (150 mg total) by mouth once. Patient not taking: Reported on 01/02/2016 02/22/15   Ardith Darkaleb M Parker, MD  Prenatal Vit-Fe Fumarate-FA (PRENATAL VITAMINS) 28-0.8 MG TABS Take 1 tablet by mouth daily. Patient not taking: Reported on 01/02/2016 12/09/15   Laurence Spatesachel Morgan Little, MD  terconazole (TERAZOL 3) 0.8 % vaginal cream Place 1 applicator vaginally at bedtime. 01/04/16   Roe Coombsachelle A Denney, CNM    Family History No family history on file.  Social History Social History  Substance Use Topics  . Smoking status: Never Smoker  .  Smokeless tobacco: Never Used  . Alcohol use No     Allergies   Patient has no known allergies.   Review of Systems Review of Systems  Constitutional: Negative for fever.  Gastrointestinal: Positive for abdominal pain. Negative for nausea and vomiting.  Genitourinary: Positive for dysuria, frequency and hematuria. Negative for vaginal bleeding.  Musculoskeletal: Positive for back pain.  All other systems reviewed and are negative.    Physical Exam Updated Vital Signs BP 106/76   Pulse 84   Temp 97.9 F (36.6 C) (Oral)   Resp 16   LMP 10/05/2015   SpO2 100%   Physical Exam  Constitutional: She is oriented to  person, place, and time. She appears well-developed and well-nourished. No distress.  HENT:  Head: Normocephalic and atraumatic.  Right Ear: External ear normal.  Left Ear: External ear normal.  Nose: Nose normal.  Eyes: Right eye exhibits no discharge. Left eye exhibits no discharge.  Cardiovascular: Normal rate, regular rhythm and normal heart sounds.   Pulmonary/Chest: Effort normal and breath sounds normal.  Abdominal: Soft. There is tenderness in the suprapubic area.  gravid  Neurological: She is alert and oriented to person, place, and time.  Skin: Skin is warm and dry. She is not diaphoretic.  Nursing note and vitals reviewed.    ED Treatments / Results  Labs (all labs ordered are listed, but only abnormal results are displayed) Labs Reviewed  URINALYSIS, ROUTINE W REFLEX MICROSCOPIC - Abnormal; Notable for the following:       Result Value   APPearance CLOUDY (*)    Specific Gravity, Urine 1.003 (*)    Hgb urine dipstick LARGE (*)    Protein, ur 100 (*)    Leukocytes, UA LARGE (*)    Bacteria, UA MANY (*)    Squamous Epithelial / LPF 0-5 (*)    Non Squamous Epithelial 0-5 (*)    All other components within normal limits  HCG, QUANTITATIVE, PREGNANCY - Abnormal; Notable for the following:    hCG, Beta Chain, Quant, S [id number],[id number] (*)    All other components within normal limits  COMPREHENSIVE METABOLIC PANEL - Abnormal; Notable for the following:    Sodium 130 (*)    Potassium 3.3 (*)    Chloride 100 (*)    Glucose, Bld 116 (*)    BUN <5 (*)    Calcium 8.8 (*)    All other components within normal limits  CBC - Abnormal; Notable for the following:    RBC 3.64 (*)    Hemoglobin 11.6 (*)    HCT 31.6 (*)    MCHC 36.7 (*)    All other components within normal limits    EKG  EKG Interpretation None       Radiology No results found.  Procedures Procedures (including critical care time)  Medications Ordered in ED Medications  potassium chloride SA  (K-DUR,KLOR-CON) CR tablet 40 mEq (not administered)  acetaminophen (TYLENOL) tablet 500 mg (not administered)  cephALEXin (KEFLEX) capsule 500 mg (not administered)     Initial Impression / Assessment and Plan / ED Course  I have reviewed the triage vital signs and the nursing notes.  Pertinent labs & imaging results that were available during my care of the patient were reviewed by me and considered in my medical decision making (see chart for details).  Clinical Course     Patient's presentation is consistent with acute cystitis. Given the acute onset today, no CVA tenderness, fever, or vomiting I doubt pyelonephritis.  Will give Keflex here and on discharge. Follow-up with her OB/GYN next week. Tylenol for pain. One dose of Kdur for mildly low potassium.  Final Clinical Impressions(s) / ED Diagnoses   Final diagnoses:  Acute cystitis during pregnancy in first trimester    New Prescriptions New Prescriptions   CEPHALEXIN (KEFLEX) 500 MG CAPSULE    Take 1 capsule (500 mg total) by mouth 2 (two) times daily.     Pricilla Loveless, MD 01/21/16 2031

## 2016-01-21 NOTE — ED Notes (Signed)
Patient states that she is having urgency and painful urinating with blood in urine.

## 2016-01-21 NOTE — ED Triage Notes (Signed)
Pt states "i have blood in my urine and im pregnant [redacted] weeks". Pt states its just a little bit of blood. Pt unsure if its from her urine or from her vagina. Pt c/o pelvic pain. Pt is peeing frequently.

## 2016-01-30 NOTE — L&D Delivery Note (Signed)
26 y.o. G2P1011 at 133w0d delivered a viable female infant in cephalic, LOA position. Nuchal cord x1, easily reduced at the perineum. Anterior shoulder delivered with ease. 60 sec delayed cord clamping. Cord clamped x2 and cut. Placenta delivered spontaneously intact, with 3VC. Fundus firm on exam with massage and pitocin. Good hemostasis noted.  Anesthesia: Local  Laceration: 2nd degree perineal, right labial  Suture: 3-0 Vicryl for perineal lac, 3-0 monocryl for labial  Good hemostasis noted. EBL: 350 cc 800 mcg PR cytotec placed due to persistent oozing from uterus  Mom and baby recovering in LDR.    Apgars: APGAR (1 MIN): 9   APGAR (5 MINS): 9      Weight: Pending skin to skin  Sponge and instrument count were correct x2. Placenta sent to L&D  Al CorpusMatthew R Zeitler, MD [facility] 06/27/2016, 3:39 PM  OB FELLOW DELIVERY ATTESTATION  I was gloved and present for the delivery in its entirety, and I agree with the above resident's note.    Jen MowElizabeth Mumaw, DO OB Fellow 6:33 PM

## 2016-01-31 ENCOUNTER — Ambulatory Visit (INDEPENDENT_AMBULATORY_CARE_PROVIDER_SITE_OTHER): Payer: Medicaid Other | Admitting: Certified Nurse Midwife

## 2016-01-31 VITALS — BP 92/62 | HR 85 | Temp 98.2°F | Wt 131.0 lb

## 2016-01-31 DIAGNOSIS — Z3480 Encounter for supervision of other normal pregnancy, unspecified trimester: Secondary | ICD-10-CM

## 2016-01-31 DIAGNOSIS — R946 Abnormal results of thyroid function studies: Secondary | ICD-10-CM

## 2016-01-31 DIAGNOSIS — R7989 Other specified abnormal findings of blood chemistry: Secondary | ICD-10-CM

## 2016-01-31 DIAGNOSIS — Z23 Encounter for immunization: Secondary | ICD-10-CM

## 2016-01-31 DIAGNOSIS — Z789 Other specified health status: Secondary | ICD-10-CM

## 2016-01-31 DIAGNOSIS — Z3482 Encounter for supervision of other normal pregnancy, second trimester: Secondary | ICD-10-CM

## 2016-01-31 LAB — POCT URINALYSIS DIPSTICK
BILIRUBIN UA: NEGATIVE
Blood, UA: NEGATIVE
GLUCOSE UA: NEGATIVE
Ketones, UA: NEGATIVE
LEUKOCYTES UA: NEGATIVE
NITRITE UA: NEGATIVE
Protein, UA: NEGATIVE
Spec Grav, UA: <=1.005
Urobilinogen, UA: NEGATIVE
pH, UA: 6

## 2016-01-31 NOTE — Progress Notes (Signed)
Patient states that she has some lower abdominal pain occasionally, along with some bleeding sometimes when she urinates but none today, denies contractions.

## 2016-01-31 NOTE — Progress Notes (Signed)
  Subjective:    Heather Savage is a 26 y.o. female being seen today for her obstetrical visit. She is at 2236w6d gestation. Patient reports: no complaints.  Problem List Items Addressed This Visit      Other   Supervision of normal pregnancy, antepartum - Primary   Relevant Orders   MaterniT21 PLUS Core+SCA   AFP, Serum, Open Spina Bifida   US MFM OB COMP + 14 WK   Thyroid Panel With TSH   POCT Urinalysis Dipstick (Completed)   Language barrier   Low thyroid stimulating hormone (TSH) level   Relevant Orders   Thyroid Panel With TSH    Other Visit Diagnoses    Encounter for immunization       Relevant Orders   Flu Vaccine QUAD 36+ mos IM (Completed)     Patient Active Problem List   Diagnosis Date Noted  . Low thyroid stimulating hormone (TSH) level 01/12/2016  . Supervision of normal pregnancy, antepartum 01/02/2016  . Language barrier 01/02/2016  . Complex ovarian cyst 09/18/2013  . Hearing loss 05/01/2011    Objective:     BP 92/62   Pulse 85   Temp 98.2 F (36.8 C)   Wt 131 lb (59.4 kg)   LMP 10/05/2015   BMI 21.14 kg/m  Uterine Size: Below umbilicus   FHR: 150 by doppler, +FM  Assessment:    Pregnancy @ 136w6d  weeks Doing well    Plan:    Problem list reviewed and updated. Labs reviewed.  Follow up in 4 weeks. FIRST/CF mutation testing/NIPT/QUAD SCREEN/fragile X/Ashkenazi Jewish population testing/Spinal muscular atrophy discussed: ordered. Role of ultrasound in pregnancy discussed; fetal survey: ordered. Amniocentesis discussed: not indicated. 50% of 15 minute visit spent on counseling and coordination of care.

## 2016-02-02 ENCOUNTER — Other Ambulatory Visit: Payer: Self-pay | Admitting: Certified Nurse Midwife

## 2016-02-02 DIAGNOSIS — Z3480 Encounter for supervision of other normal pregnancy, unspecified trimester: Secondary | ICD-10-CM

## 2016-02-02 LAB — AFP, SERUM, OPEN SPINA BIFIDA
AFP MoM: 0.93
AFP Value: 41.4 ng/mL
GEST. AGE ON COLLECTION DATE: 16.9 wk
Maternal Age At EDD: 26.1 years
OSBR Risk 1 IN: 10000
Test Results:: NEGATIVE
Weight: 131 [lb_av]

## 2016-02-02 LAB — THYROID PANEL WITH TSH
FREE THYROXINE INDEX: 2.1 (ref 1.2–4.9)
T3 UPTAKE RATIO: 17 % — AB (ref 24–39)
T4 TOTAL: 12.6 ug/dL — AB (ref 4.5–12.0)
TSH: 1.27 u[IU]/mL (ref 0.450–4.500)

## 2016-02-06 ENCOUNTER — Encounter (HOSPITAL_COMMUNITY): Payer: Self-pay | Admitting: Certified Nurse Midwife

## 2016-02-08 ENCOUNTER — Other Ambulatory Visit: Payer: Self-pay | Admitting: Certified Nurse Midwife

## 2016-02-08 DIAGNOSIS — O26899 Other specified pregnancy related conditions, unspecified trimester: Secondary | ICD-10-CM | POA: Insufficient documentation

## 2016-02-08 DIAGNOSIS — Z6791 Unspecified blood type, Rh negative: Secondary | ICD-10-CM | POA: Insufficient documentation

## 2016-02-08 LAB — MATERNIT21 PLUS CORE+SCA
CHROMOSOME 13: NEGATIVE
CHROMOSOME 18: NEGATIVE
CHROMOSOME 21: NEGATIVE
PDF: 0
Y CHROMOSOME: DETECTED

## 2016-02-16 ENCOUNTER — Ambulatory Visit (HOSPITAL_COMMUNITY): Payer: Medicaid Other

## 2016-02-23 ENCOUNTER — Ambulatory Visit (HOSPITAL_COMMUNITY)
Admission: RE | Admit: 2016-02-23 | Discharge: 2016-02-23 | Disposition: A | Discharge status: Home / Self Care | Payer: Medicaid Other | Source: Ambulatory Visit | Attending: Certified Nurse Midwife | Admitting: Certified Nurse Midwife

## 2016-02-23 DIAGNOSIS — Z3482 Encounter for supervision of other normal pregnancy, second trimester: Secondary | ICD-10-CM | POA: Insufficient documentation

## 2016-02-23 DIAGNOSIS — Z3A20 20 weeks gestation of pregnancy: Secondary | ICD-10-CM | POA: Diagnosis not present

## 2016-02-23 DIAGNOSIS — Z3480 Encounter for supervision of other normal pregnancy, unspecified trimester: Secondary | ICD-10-CM | POA: Diagnosis present

## 2016-02-23 DIAGNOSIS — Z363 Encounter for antenatal screening for malformations: Secondary | ICD-10-CM | POA: Insufficient documentation

## 2016-02-23 NOTE — Addendum Note (Signed)
Encounter addended by: Lenoard Adenlivia M Chardonay Scritchfield, RT on: 02/23/2016  2:17 PM<BR>    Actions taken: Imaging Exam ended

## 2016-02-24 ENCOUNTER — Other Ambulatory Visit: Payer: Self-pay | Admitting: Certified Nurse Midwife

## 2016-02-24 DIAGNOSIS — Z3480 Encounter for supervision of other normal pregnancy, unspecified trimester: Secondary | ICD-10-CM

## 2016-02-26 IMAGING — US US TRANSVAGINAL NON-OB
1 series · 14 of 25 positions shown · non-contrast
Comparison: 08/07/2013.

CLINICAL DATA: 23-year-old female with suprapubic pain. Complex
left ovarian lesion. Subsequent encounter. LMP 09/21/2013.

EXAM:
TRANSVAGINAL ULTRASOUND OF PELVIS
TECHNIQUE: Transvaginal ultrasound examination of the pelvis was performed
including evaluation of the uterus, ovaries, adnexal regions, and
pelvic cul-de-sac.

[Series 1: us transvaginal non-ob · 43 acquisitions, 14 frames shown]
[im 1/43]
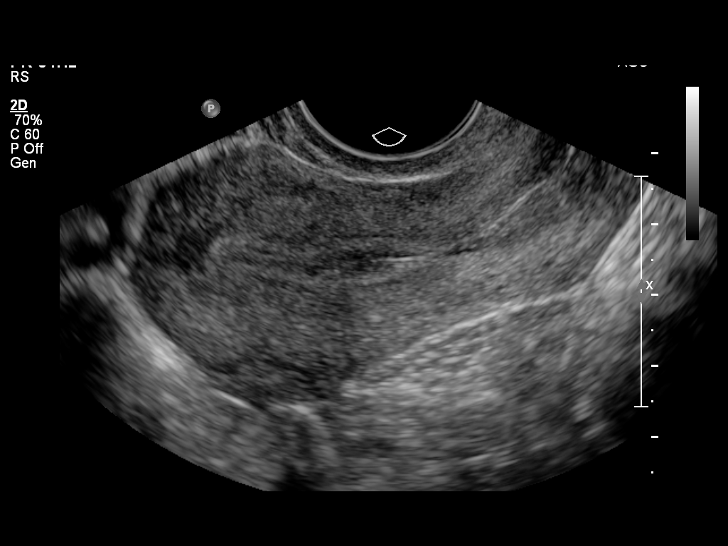
[im 4/43]
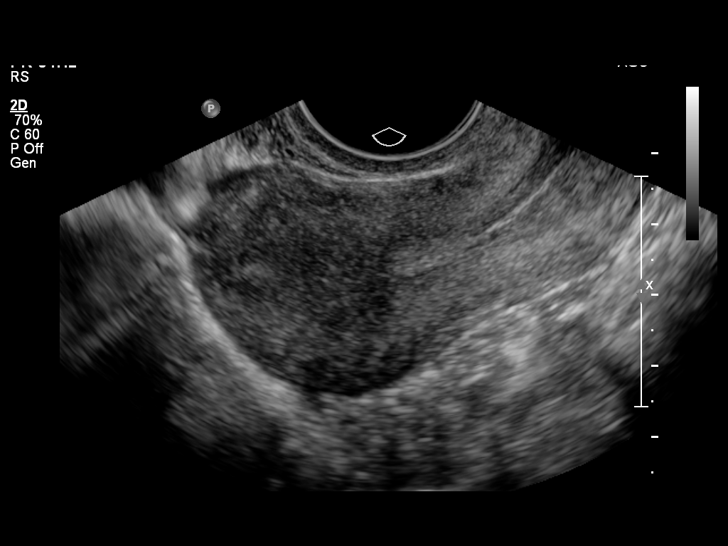
[im 8/43]
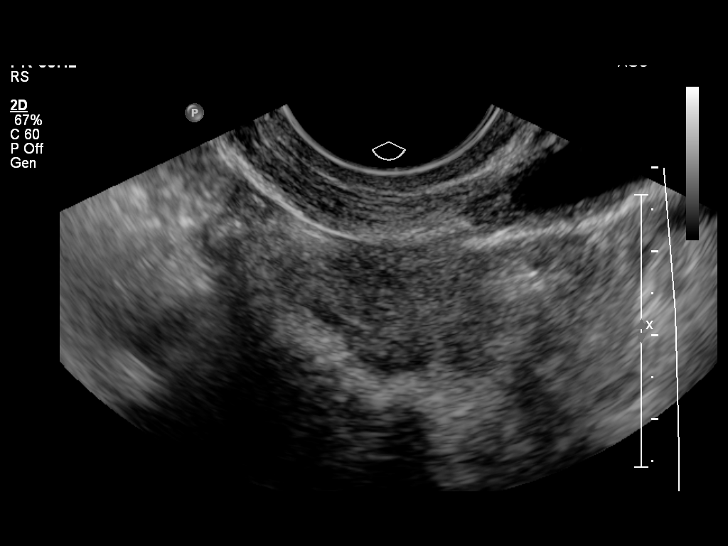
[im 11/43]
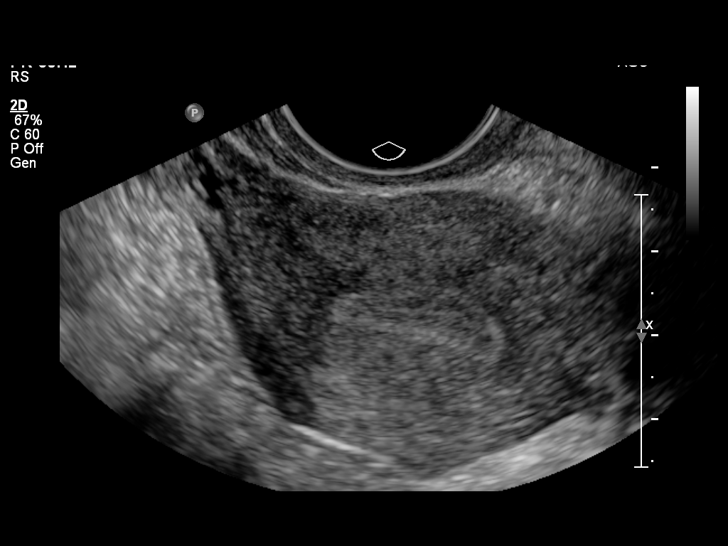
[im 15/43]
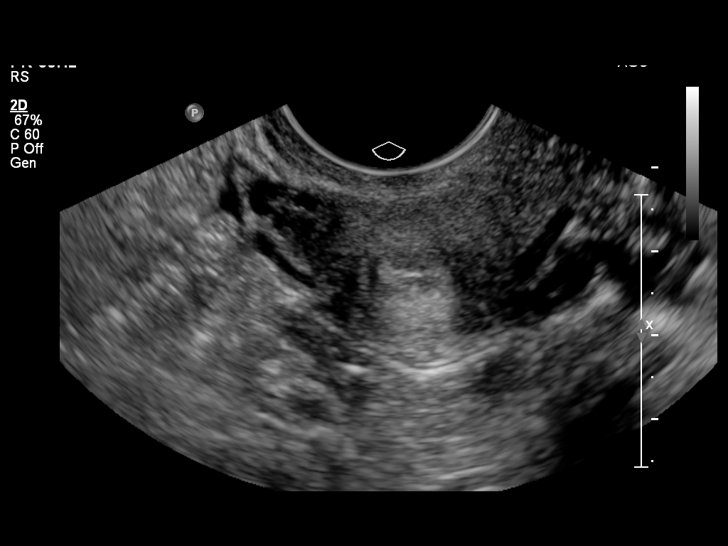
[im 16/43]
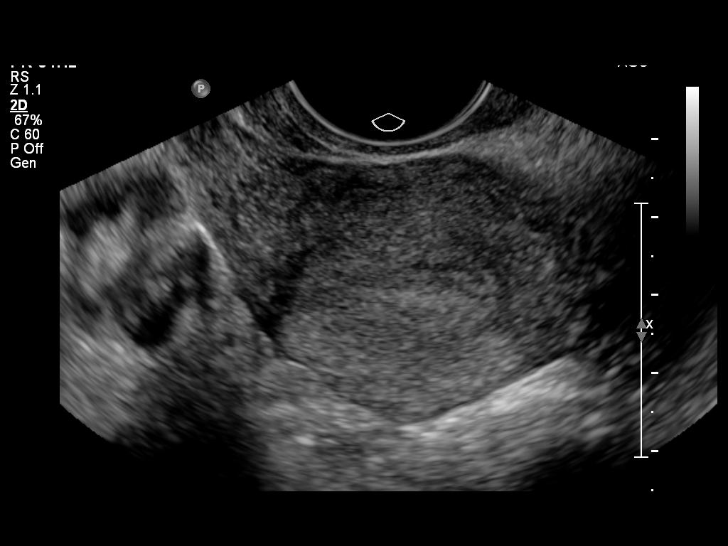
[im 20/43]
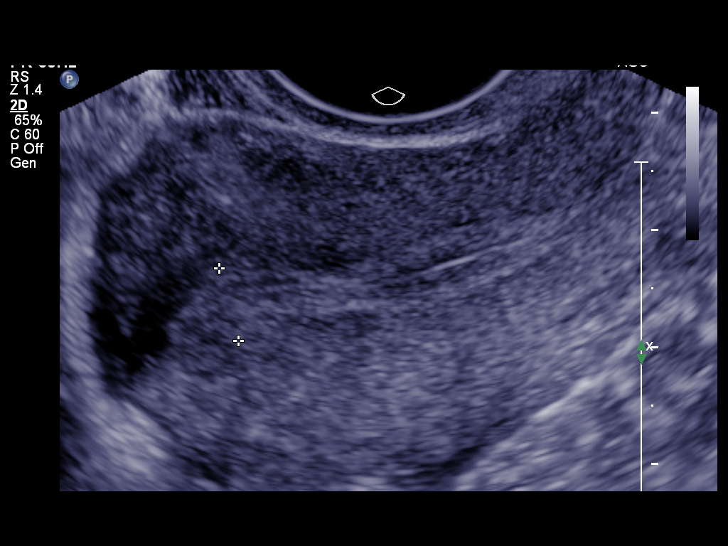
[im 23/43]
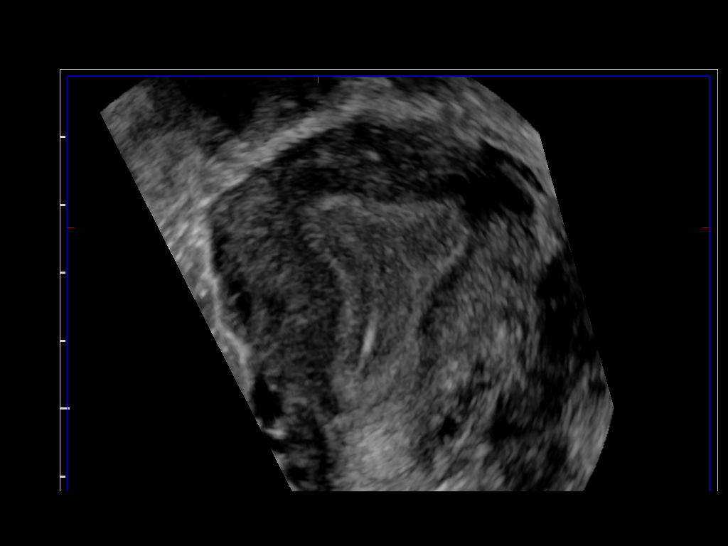
[im 27/43]
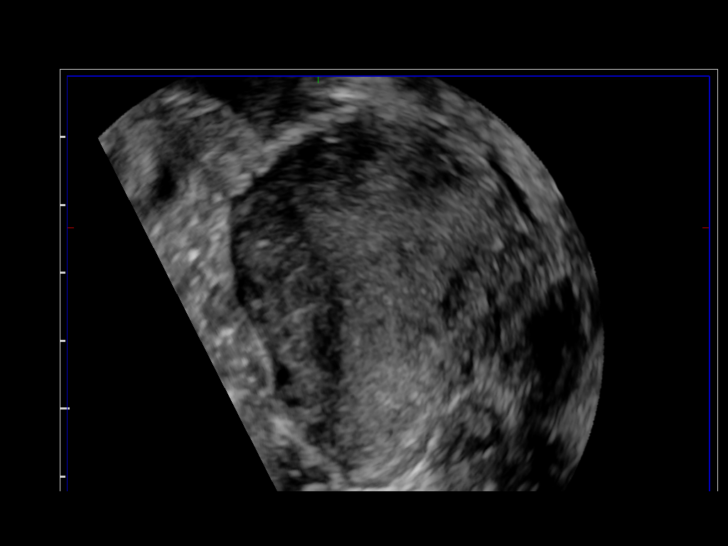
[im 29/43]
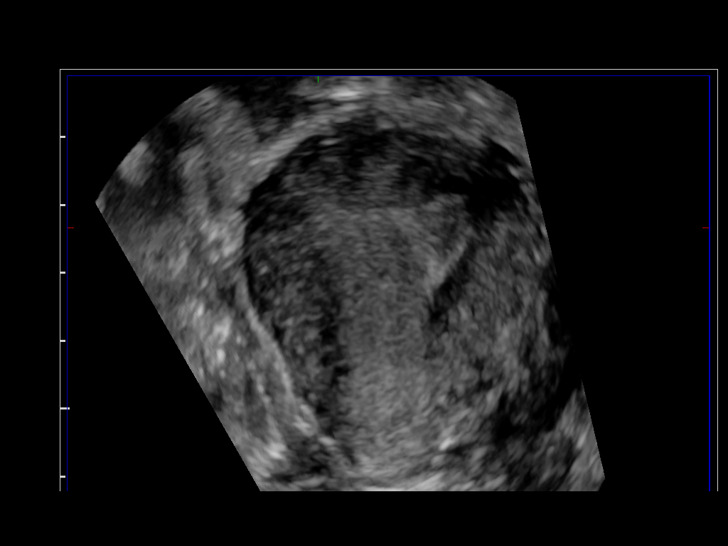
[im 32/43]
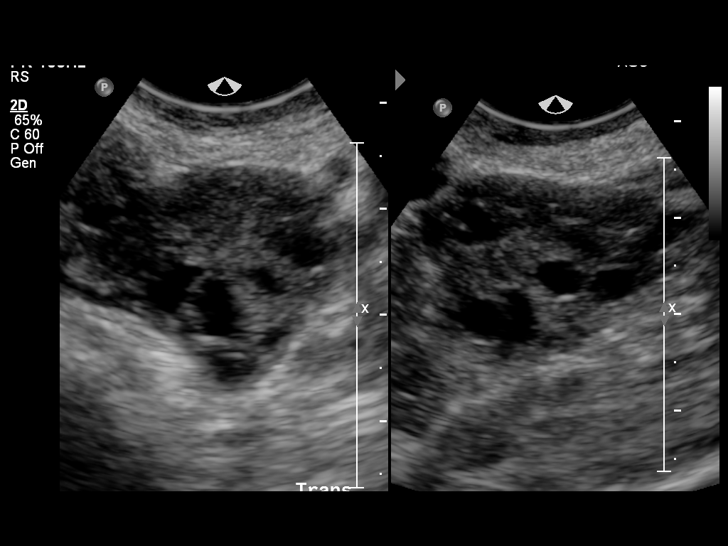
[im 36/43]
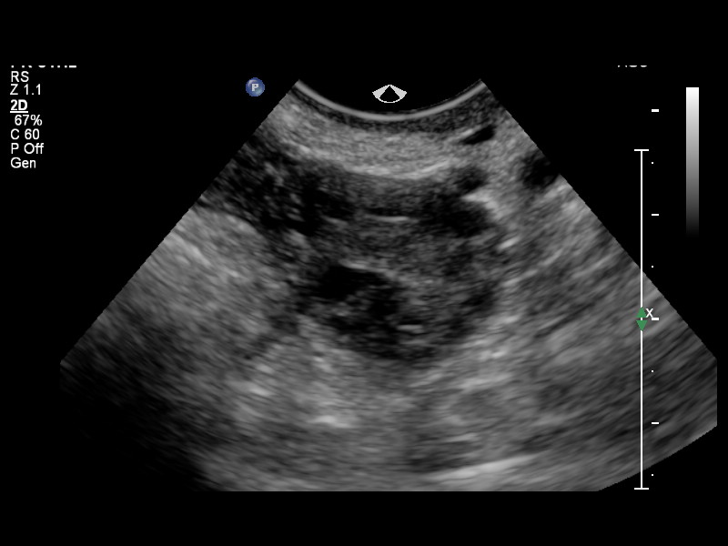
[im 39/43]
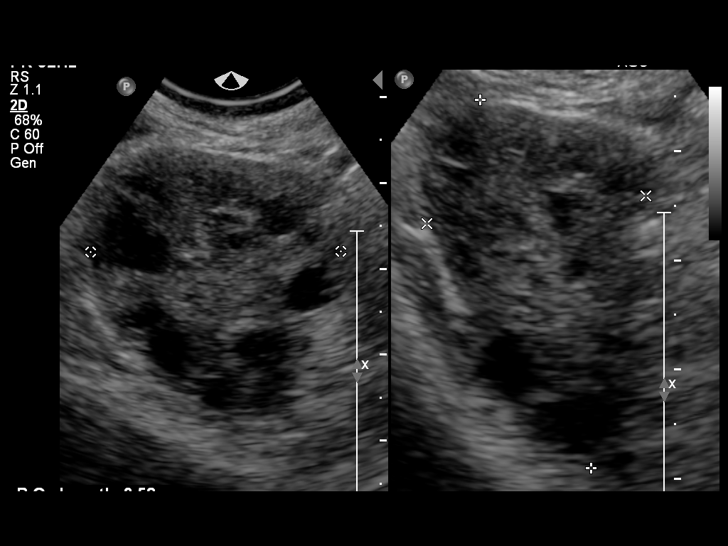
[im 43/43]
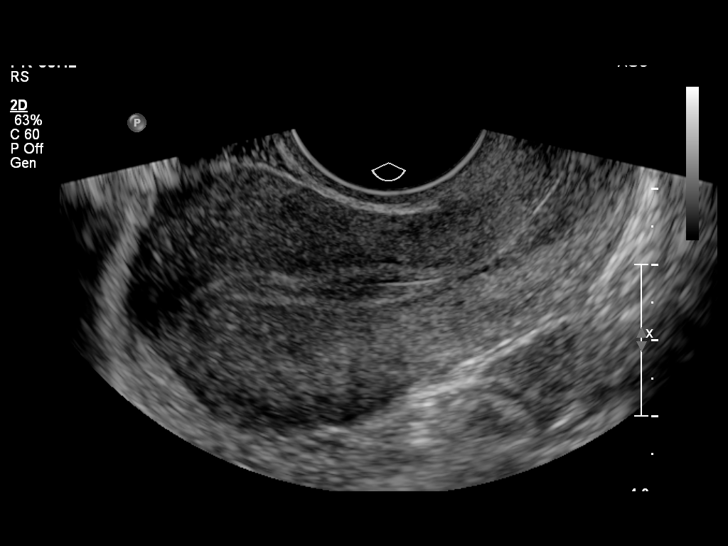

[14 of 25 positions shown; findings below may reference images not displayed]

FINDINGS: Uterus

Measurements: 7.0 x 3.4 x 4.5 cm. No fibroids or other mass
visualized.

Endometrium

Thickness: 6 mm.  No focal abnormality visualized.

Right ovary

Measurements: 3.5 x 2.0 x 2.9 cm. Multiple small follicles. Normal
appearance/no adnexal mass.

Left ovary

Measurements: 2.7 x 2.0 x 2.2 cm. Multiple small follicles. No
residual complex cyst. Normal appearance/no adnexal mass.

Other findings:  No free fluid
IMPRESSION: Normal pelvic ultrasound, with interval resolved left ovarian
(hemorrhagic) cyst.

## 2016-02-29 ENCOUNTER — Ambulatory Visit (INDEPENDENT_AMBULATORY_CARE_PROVIDER_SITE_OTHER): Payer: Medicaid Other | Admitting: Obstetrics and Gynecology

## 2016-02-29 VITALS — BP 102/70 | HR 90 | Temp 98.1°F | Wt 132.9 lb

## 2016-02-29 DIAGNOSIS — Z3400 Encounter for supervision of normal first pregnancy, unspecified trimester: Secondary | ICD-10-CM

## 2016-02-29 DIAGNOSIS — Z3492 Encounter for supervision of normal pregnancy, unspecified, second trimester: Secondary | ICD-10-CM

## 2016-02-29 DIAGNOSIS — O26899 Other specified pregnancy related conditions, unspecified trimester: Secondary | ICD-10-CM

## 2016-02-29 DIAGNOSIS — Z6791 Unspecified blood type, Rh negative: Secondary | ICD-10-CM

## 2016-02-29 DIAGNOSIS — Z3490 Encounter for supervision of normal pregnancy, unspecified, unspecified trimester: Secondary | ICD-10-CM

## 2016-02-29 NOTE — Progress Notes (Signed)
   PRENATAL VISIT NOTE  Subjective:  Heather Savage is a 26 y.o. G2P0010 at 2549w0d being seen today for ongoing prenatal care.  She is currently monitored for the following issues for this low-risk pregnancy and has Hearing loss; Complex ovarian cyst; Supervision of normal pregnancy, antepartum; Language barrier; Low thyroid stimulating hormone (TSH) level; and Rh negative state in antepartum period on her problem list.  Patient reports noting some pink discharge following urination on some occasions. She reports some dysuria at times. She thinks she may have a UTI.  Contractions: Not present. Vag. Bleeding: None.  Movement: Present. Denies leaking of fluid.   The following portions of the patient's history were reviewed and updated as appropriate: allergies, current medications, past family history, past medical history, past social history, past surgical history and problem list. Problem list updated.  Objective:   Vitals:   02/29/16 1454  BP: 102/70  Pulse: 90  Temp: 98.1 F (36.7 C)  Weight: 132 lb 14.4 oz (60.3 kg)    Fetal Status: Fetal Heart Rate (bpm): 156   Movement: Present     General:  Alert, oriented and cooperative. Patient is in no acute distress.  Skin: Skin is warm and dry. No rash noted.   Cardiovascular: Normal heart rate noted  Respiratory: Normal respiratory effort, no problems with respiration noted  Abdomen: Soft, gravid, appropriate for gestational age. Pain/Pressure: Present     Pelvic:  Cervical exam deferred        Extremities: Normal range of motion.  Edema: Trace  Mental Status: Normal mood and affect. Normal behavior. Normal judgment and thought content.   Assessment and Plan:  Pregnancy: G2P0010 at 4349w0d  1. Encounter for supervision of normal pregnancy, antepartum, unspecified gravidity Patient is doing well Bleeding precautions reviewed Urine culture collected - Culture, OB Urine  2. Supervision of normal first pregnancy,  antepartum   3. Rh negative state in antepartum period Will rhogam at 28 weeks  Preterm labor symptoms and general obstetric precautions including but not limited to vaginal bleeding, contractions, leaking of fluid and fetal movement were reviewed in detail with the patient. Please refer to After Visit Summary for other counseling recommendations.  No Follow-up on file.   Catalina AntiguaPeggy Kendalyn Cranfield, MD

## 2016-03-03 LAB — CULTURE, OB URINE

## 2016-03-03 LAB — URINE CULTURE, OB REFLEX

## 2016-03-15 ENCOUNTER — Telehealth: Payer: Self-pay

## 2016-03-15 DIAGNOSIS — Z3A23 23 weeks gestation of pregnancy: Secondary | ICD-10-CM

## 2016-03-15 MED ORDER — PRENATAL VITAMINS 28-0.8 MG PO TABS: 1.0000 | ORAL_TABLET | Freq: Every day | ORAL | 10 refills | Status: DC

## 2016-03-15 NOTE — Telephone Encounter (Signed)
error 

## 2016-03-28 ENCOUNTER — Ambulatory Visit (INDEPENDENT_AMBULATORY_CARE_PROVIDER_SITE_OTHER): Payer: Medicaid Other | Admitting: Certified Nurse Midwife

## 2016-03-28 VITALS — BP 105/70 | HR 80 | Wt 145.0 lb

## 2016-03-28 DIAGNOSIS — Z3480 Encounter for supervision of other normal pregnancy, unspecified trimester: Secondary | ICD-10-CM

## 2016-03-28 DIAGNOSIS — Z789 Other specified health status: Secondary | ICD-10-CM

## 2016-03-28 DIAGNOSIS — R7989 Other specified abnormal findings of blood chemistry: Secondary | ICD-10-CM

## 2016-03-28 DIAGNOSIS — R946 Abnormal results of thyroid function studies: Secondary | ICD-10-CM

## 2016-03-28 DIAGNOSIS — Z3482 Encounter for supervision of other normal pregnancy, second trimester: Secondary | ICD-10-CM

## 2016-03-28 DIAGNOSIS — O2686 Pruritic urticarial papules and plaques of pregnancy (PUPPP): Secondary | ICD-10-CM

## 2016-03-28 DIAGNOSIS — Z6791 Unspecified blood type, Rh negative: Secondary | ICD-10-CM

## 2016-03-28 DIAGNOSIS — O26899 Other specified pregnancy related conditions, unspecified trimester: Principal | ICD-10-CM

## 2016-03-28 MED ORDER — HYDROXYZINE PAMOATE 50 MG PO CAPS: 50.0000 mg | ORAL_CAPSULE | Freq: Three times a day (TID) | ORAL | 1 refills | Status: DC | PRN

## 2016-03-28 NOTE — Progress Notes (Signed)
   PRENATAL VISIT NOTE  Subjective:  Heather Savage is a 26 y.o. G2P0010 at 9953w0d being seen today for ongoing prenatal care.  She is currently monitored for the following issues for this low-risk pregnancy and has Hearing loss; Complex ovarian cyst; Supervision of normal pregnancy, antepartum; Language barrier; Low thyroid stimulating hormone (TSH) level; and Rh negative state in antepartum period on her problem list.  Patient reports backache, no bleeding, no contractions, no cramping, no leaking and abdominal itching.  Contractions: Not present. Vag. Bleeding: None.  Movement: Present. Denies leaking of fluid.   The following portions of the patient's history were reviewed and updated as appropriate: allergies, current medications, past family history, past medical history, past social history, past surgical history and problem list. Problem list updated.  Objective:   Vitals:   03/28/16 1336  BP: 105/70  Pulse: 80  Weight: 145 lb (65.8 kg)    Fetal Status: Fetal Heart Rate (bpm): 152 Fundal Height: 25 cm Movement: Present     General:  Alert, oriented and cooperative. Patient is in no acute distress.  Skin: Skin is warm and dry. No rash noted.   Cardiovascular: Normal heart rate noted  Respiratory: Normal respiratory effort, no problems with respiration noted  Abdomen: Soft, gravid, appropriate for gestational age. Pain/Pressure: Absent     Pelvic:  Cervical exam deferred        Extremities: Normal range of motion.     Mental Status: Normal mood and affect. Normal behavior. Normal judgment and thought content.   Assessment and Plan:  Pregnancy: G2P0010 at 7853w0d  1. Rh negative state in antepartum period     Rhogam with next visit  2. Low thyroid stimulating hormone (TSH) level     - Thyroid Panel With TSH  3. Language barrier     Spouse translated  4. Supervision of other normal pregnancy, antepartum      PUPP vs. Cholestasis      Rx given for abdominal support  belt.   5. PUPP (pruritic urticarial papules and plaques of pregnancy)     Vistaril Rx given - Hepatic function panel - Bile acids, total  Preterm labor symptoms and general obstetric precautions including but not limited to vaginal bleeding, contractions, leaking of fluid and fetal movement were reviewed in detail with the patient. Please refer to After Visit Summary for other counseling recommendations.  Return in about 3 weeks (around 04/18/2016) for ROB, 2 hr OGTT, Rhogam.   Roe Coombsachelle A Denney, CNM

## 2016-03-29 LAB — HEPATIC FUNCTION PANEL
ALBUMIN: 3.4 g/dL — AB (ref 3.5–5.5)
ALT: 24 IU/L (ref 0–32)
AST: 26 IU/L (ref 0–40)
Alkaline Phosphatase: 70 IU/L (ref 39–117)
Bilirubin, Direct: 0.05 mg/dL (ref 0.00–0.40)
TOTAL PROTEIN: 6.3 g/dL (ref 6.0–8.5)

## 2016-03-29 LAB — THYROID PANEL WITH TSH
FREE THYROXINE INDEX: 1.5 (ref 1.2–4.9)
T3 UPTAKE RATIO: 14 % — AB (ref 24–39)
T4 TOTAL: 11 ug/dL (ref 4.5–12.0)
TSH: 1.4 u[IU]/mL (ref 0.450–4.500)

## 2016-03-29 LAB — BILE ACIDS, TOTAL: Bile Acids Total: 10.4 umol/L (ref 4.7–24.5)

## 2016-04-16 ENCOUNTER — Encounter: Payer: Self-pay | Admitting: *Deleted

## 2016-04-18 ENCOUNTER — Encounter: Payer: Self-pay | Admitting: Certified Nurse Midwife

## 2016-04-18 ENCOUNTER — Other Ambulatory Visit: Payer: Medicaid Other

## 2016-04-18 ENCOUNTER — Ambulatory Visit (INDEPENDENT_AMBULATORY_CARE_PROVIDER_SITE_OTHER): Payer: Medicaid Other | Admitting: Certified Nurse Midwife

## 2016-04-18 VITALS — BP 105/70 | HR 76 | Wt 144.8 lb

## 2016-04-18 DIAGNOSIS — Z3480 Encounter for supervision of other normal pregnancy, unspecified trimester: Secondary | ICD-10-CM

## 2016-04-18 DIAGNOSIS — Z3483 Encounter for supervision of other normal pregnancy, third trimester: Secondary | ICD-10-CM

## 2016-04-18 DIAGNOSIS — O360930 Maternal care for other rhesus isoimmunization, third trimester, not applicable or unspecified: Secondary | ICD-10-CM | POA: Diagnosis not present

## 2016-04-18 DIAGNOSIS — Z6791 Unspecified blood type, Rh negative: Secondary | ICD-10-CM

## 2016-04-18 DIAGNOSIS — O26899 Other specified pregnancy related conditions, unspecified trimester: Secondary | ICD-10-CM

## 2016-04-18 MED ORDER — RHO D IMMUNE GLOBULIN 1500 UNITS IM SOSY: 1500.0000 [IU] | PREFILLED_SYRINGE | Freq: Once | INTRAMUSCULAR | Status: DC

## 2016-04-18 MED ORDER — RHO D IMMUNE GLOBULIN 1500 UNIT/2ML IJ SOSY
300.0000 ug | PREFILLED_SYRINGE | Freq: Once | INTRAMUSCULAR | Status: AC
  Administered 2016-04-18: 300 ug via INTRAMUSCULAR

## 2016-04-18 NOTE — Progress Notes (Signed)
Pt presents for ROB, 2 gtt, antibody screen, and Rhogam. Pt c/o L side abdominal pain x 1 week - did not try OTC pain meds to relieve pain.

## 2016-04-18 NOTE — Progress Notes (Signed)
   PRENATAL VISIT NOTE  Subjective:  Heather Savage is a 26 y.o. G2P0010 at 6231w0d being seen today for ongoing prenatal care.  She is currently monitored for the following issues for this low-risk pregnancy and has Hearing loss; Complex ovarian cyst; Supervision of normal pregnancy, antepartum; Language barrier; Low thyroid stimulating hormone (TSH) level; and Rh negative state in antepartum period on her problem list.  Patient reports no complaints.  Contractions: Not present. Vag. Bleeding: None.  Movement: Present. Denies leaking of fluid.   The following portions of the patient's history were reviewed and updated as appropriate: allergies, current medications, past family history, past medical history, past social history, past surgical history and problem list. Problem list updated.  Objective:   Vitals:   04/18/16 0839  BP: 105/70  Pulse: 76  Weight: 144 lb 12.8 oz (65.7 kg)    Fetal Status: Fetal Heart Rate (bpm): 148 Fundal Height: 27 cm Movement: Present     General:  Alert, oriented and cooperative. Patient is in no acute distress.  Skin: Skin is warm and dry. No rash noted.   Cardiovascular: Normal heart rate noted  Respiratory: Normal respiratory effort, no problems with respiration noted  Abdomen: Soft, gravid, appropriate for gestational age. Pain/Pressure: Absent     Pelvic:  Cervical exam deferred        Extremities: Normal range of motion.  Edema: None  Mental Status: Normal mood and affect. Normal behavior. Normal judgment and thought content.   Assessment and Plan:  Pregnancy: G2P0010 at 1831w0d  1. Supervision of other normal pregnancy, antepartum     Doing well.   - Glucose Tolerance, 2 Hours w/1 Hour - CBC - RPR - HIV antibody - Antibody screen - Thyroid Panel With TSH - rho (d) immune globulin (RHIG/RHOPHYLAC) injection 300 mcg; Inject 2 mLs (300 mcg total) into the muscle once.  2. Rh negative state in antepartum period     Rhogam given today.     Preterm labor symptoms and general obstetric precautions including but not limited to vaginal bleeding, contractions, leaking of fluid and fetal movement were reviewed in detail with the patient. Please refer to After Visit Summary for other counseling recommendations.  Return in about 2 weeks (around 05/02/2016) for ROB.   Roe Coombsachelle A Jarett Dralle, CNM

## 2016-04-19 ENCOUNTER — Other Ambulatory Visit: Payer: Self-pay | Admitting: Certified Nurse Midwife

## 2016-04-19 DIAGNOSIS — Z3480 Encounter for supervision of other normal pregnancy, unspecified trimester: Secondary | ICD-10-CM

## 2016-04-19 DIAGNOSIS — R7989 Other specified abnormal findings of blood chemistry: Secondary | ICD-10-CM | POA: Insufficient documentation

## 2016-04-19 LAB — CBC
Hematocrit: 33.4 % — ABNORMAL LOW (ref 34.0–46.6)
Hemoglobin: 11.6 g/dL (ref 11.1–15.9)
MCH: 32.3 pg (ref 26.6–33.0)
MCHC: 34.7 g/dL (ref 31.5–35.7)
MCV: 93 fL (ref 79–97)
PLATELETS: 202 10*3/uL (ref 150–379)
RBC: 3.59 x10E6/uL — AB (ref 3.77–5.28)
RDW: 12.6 % (ref 12.3–15.4)
WBC: 7.3 10*3/uL (ref 3.4–10.8)

## 2016-04-19 LAB — RPR: RPR: NONREACTIVE

## 2016-04-19 LAB — GLUCOSE TOLERANCE, 2 HOURS W/ 1HR
GLUCOSE, 1 HOUR: 148 mg/dL (ref 65–179)
GLUCOSE, 2 HOUR: 100 mg/dL (ref 65–152)
GLUCOSE, FASTING: 85 mg/dL (ref 65–91)

## 2016-04-19 LAB — THYROID PANEL WITH TSH
FREE THYROXINE INDEX: 1.6 (ref 1.2–4.9)
T3 Uptake Ratio: 13 % — ABNORMAL LOW (ref 24–39)
T4, Total: 12.5 ug/dL — ABNORMAL HIGH (ref 4.5–12.0)
TSH: 2.2 u[IU]/mL (ref 0.450–4.500)

## 2016-04-19 LAB — ANTIBODY SCREEN: ANTIBODY SCREEN: NEGATIVE

## 2016-04-19 LAB — HIV ANTIBODY (ROUTINE TESTING W REFLEX): HIV Screen 4th Generation wRfx: NONREACTIVE

## 2016-04-20 ENCOUNTER — Encounter: Payer: Self-pay | Admitting: *Deleted

## 2016-05-03 ENCOUNTER — Encounter: Payer: Self-pay | Admitting: Certified Nurse Midwife

## 2016-05-03 ENCOUNTER — Ambulatory Visit (INDEPENDENT_AMBULATORY_CARE_PROVIDER_SITE_OTHER): Payer: Medicaid Other | Admitting: Certified Nurse Midwife

## 2016-05-03 VITALS — BP 103/68 | HR 93 | Wt 147.2 lb

## 2016-05-03 DIAGNOSIS — Z3480 Encounter for supervision of other normal pregnancy, unspecified trimester: Secondary | ICD-10-CM

## 2016-05-03 DIAGNOSIS — R7989 Other specified abnormal findings of blood chemistry: Secondary | ICD-10-CM

## 2016-05-03 DIAGNOSIS — R946 Abnormal results of thyroid function studies: Secondary | ICD-10-CM

## 2016-05-03 NOTE — Progress Notes (Signed)
Patient reports she is doing well today. 

## 2016-05-03 NOTE — Progress Notes (Signed)
   PRENATAL VISIT NOTE  Subjective:  Heather Savage is a 26 y.o. G2P0010 at [redacted]w[redacted]d being seen today for ongoing prenatal care.  She is currently monitored for the following issues for this low-risk pregnancy and has Hearing loss; Complex ovarian cyst; Supervision of normal pregnancy, antepartum; Language barrier; Low thyroid stimulating hormone (TSH) level; Rh negative state in antepartum period; and Abnormal TSH on her problem list.  Patient reports no complaints.  Contractions: Not present. Vag. Bleeding: None.  Movement: Present. Denies leaking of fluid.   The following portions of the patient's history were reviewed and updated as appropriate: allergies, current medications, past family history, past medical history, past social history, past surgical history and problem list. Problem list updated.  Objective:   Vitals:   05/03/16 1316  BP: 103/68  Pulse: 93  Weight: 147 lb 3.2 oz (66.8 kg)    Fetal Status:     Movement: Present     General:  Alert, oriented and cooperative. Patient is in no acute distress.  Skin: Skin is warm and dry. No rash noted.   Cardiovascular: Normal heart rate noted  Respiratory: Normal respiratory effort, no problems with respiration noted  Abdomen: Soft, gravid, appropriate for gestational age. Pain/Pressure: Absent     Pelvic:  Cervical exam deferred        Extremities: Normal range of motion.  Edema: None  Mental Status: Normal mood and affect. Normal behavior. Normal judgment and thought content.   Assessment and Plan:  Pregnancy: G2P0010 at [redacted]w[redacted]d   1.Supervision of other normal pregnancy, antepartum Patient reports doing well. Abnormal TSH: endocrinology consult in process.      Preterm labor symptoms and general obstetric precautions including but not limited to vaginal bleeding, contractions, leaking of fluid and fetal movement were reviewed in detail with the patient. Please refer to After Visit Summary for other counseling  recommendations.  2wk rob followup Elinor Parkinson, Student-MidWife

## 2016-05-17 ENCOUNTER — Encounter (HOSPITAL_COMMUNITY): Payer: Self-pay

## 2016-05-17 ENCOUNTER — Emergency Department (HOSPITAL_COMMUNITY)
Admission: EM | Admit: 2016-05-17 | Discharge: 2016-05-17 | Disposition: A | Discharge status: Home / Self Care | Payer: Medicaid Other | Source: Home / Self Care | Attending: Emergency Medicine | Admitting: Emergency Medicine

## 2016-05-17 ENCOUNTER — Encounter: Payer: Self-pay | Admitting: Certified Nurse Midwife

## 2016-05-17 DIAGNOSIS — O9A213 Injury, poisoning and certain other consequences of external causes complicating pregnancy, third trimester: Secondary | ICD-10-CM | POA: Diagnosis present

## 2016-05-17 DIAGNOSIS — Y92410 Unspecified street and highway as the place of occurrence of the external cause: Secondary | ICD-10-CM | POA: Diagnosis not present

## 2016-05-17 DIAGNOSIS — S3992XA Unspecified injury of lower back, initial encounter: Secondary | ICD-10-CM | POA: Insufficient documentation

## 2016-05-17 DIAGNOSIS — N859 Noninflammatory disorder of uterus, unspecified: Secondary | ICD-10-CM

## 2016-05-17 DIAGNOSIS — Y999 Unspecified external cause status: Secondary | ICD-10-CM | POA: Insufficient documentation

## 2016-05-17 DIAGNOSIS — Z3A32 32 weeks gestation of pregnancy: Secondary | ICD-10-CM | POA: Diagnosis not present

## 2016-05-17 DIAGNOSIS — N858 Other specified noninflammatory disorders of uterus: Secondary | ICD-10-CM | POA: Diagnosis not present

## 2016-05-17 DIAGNOSIS — S3991XA Unspecified injury of abdomen, initial encounter: Secondary | ICD-10-CM | POA: Diagnosis not present

## 2016-05-17 DIAGNOSIS — Y9389 Activity, other specified: Secondary | ICD-10-CM | POA: Diagnosis not present

## 2016-05-17 DIAGNOSIS — R1030 Lower abdominal pain, unspecified: Secondary | ICD-10-CM

## 2016-05-17 MED ORDER — SODIUM CHLORIDE 0.9 % IV BOLUS (SEPSIS)
1000.0000 mL | Freq: Once | INTRAVENOUS | Status: AC
  Administered 2016-05-17: 1000 mL via INTRAVENOUS
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
## 2016-05-17 NOTE — Discharge Instructions (Signed)
Please call to schedule an appointment with your OB/GYN team for the next several days. If any symptoms change or worsen, please return to the nearest emergency department.
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
## 2016-05-17 NOTE — Progress Notes (Signed)
Orthopedic Tech Progress Note Patient Details:  Heather Savage 01/29/1875 [id number]  Patient ID: Heather Savage, female   DOB: 01/29/1875, 26 y.o.   MRN: [id number]   Nikki Dom 05/17/2016, 2:38 PM Made lev el 2 trauma visit

## 2016-05-18 ENCOUNTER — Encounter (HOSPITAL_COMMUNITY): Payer: Self-pay

## 2016-05-18 ENCOUNTER — Encounter: Payer: Self-pay | Admitting: Certified Nurse Midwife

## 2016-05-25 ENCOUNTER — Encounter (HOSPITAL_COMMUNITY): Payer: Self-pay | Admitting: *Deleted

## 2016-05-25 ENCOUNTER — Inpatient Hospital Stay (HOSPITAL_COMMUNITY)
Admission: AD | Admit: 2016-05-25 | Discharge: 2016-05-25 | Disposition: A | Discharge status: Home / Self Care | Payer: Medicaid Other | Source: Ambulatory Visit | Attending: Obstetrics & Gynecology | Admitting: Obstetrics & Gynecology

## 2016-05-25 ENCOUNTER — Other Ambulatory Visit: Payer: Self-pay | Admitting: Advanced Practice Midwife

## 2016-05-25 DIAGNOSIS — O98813 Other maternal infectious and parasitic diseases complicating pregnancy, third trimester: Secondary | ICD-10-CM | POA: Insufficient documentation

## 2016-05-25 DIAGNOSIS — O479 False labor, unspecified: Secondary | ICD-10-CM

## 2016-05-25 DIAGNOSIS — B373 Candidiasis of vulva and vagina: Secondary | ICD-10-CM

## 2016-05-25 DIAGNOSIS — R102 Pelvic and perineal pain: Secondary | ICD-10-CM

## 2016-05-25 DIAGNOSIS — Z3A33 33 weeks gestation of pregnancy: Secondary | ICD-10-CM | POA: Diagnosis not present

## 2016-05-25 DIAGNOSIS — O4703 False labor before 37 completed weeks of gestation, third trimester: Secondary | ICD-10-CM | POA: Insufficient documentation

## 2016-05-25 DIAGNOSIS — B3731 Acute candidiasis of vulva and vagina: Secondary | ICD-10-CM

## 2016-05-25 DIAGNOSIS — O26899 Other specified pregnancy related conditions, unspecified trimester: Secondary | ICD-10-CM

## 2016-05-25 DIAGNOSIS — R109 Unspecified abdominal pain: Secondary | ICD-10-CM | POA: Diagnosis present

## 2016-05-25 DIAGNOSIS — O26893 Other specified pregnancy related conditions, third trimester: Secondary | ICD-10-CM | POA: Diagnosis present

## 2016-05-25 LAB — URINALYSIS, ROUTINE W REFLEX MICROSCOPIC
BILIRUBIN URINE: NEGATIVE
GLUCOSE, UA: NEGATIVE mg/dL
Hgb urine dipstick: NEGATIVE
KETONES UR: NEGATIVE mg/dL
Nitrite: NEGATIVE
PH: 6 (ref 5.0–8.0)
PROTEIN: 30 mg/dL — AB
Specific Gravity, Urine: 1.024 (ref 1.005–1.030)

## 2016-05-25 LAB — WET PREP, GENITAL
Clue Cells Wet Prep HPF POC: NONE SEEN
SPERM: NONE SEEN
TRICH WET PREP: NONE SEEN

## 2016-05-25 LAB — FETAL FIBRONECTIN: Fetal Fibronectin: NEGATIVE

## 2016-05-25 MED ORDER — TERCONAZOLE 0.4 % VA CREA: 1.0000 | TOPICAL_CREAM | Freq: Every day | VAGINAL | 0 refills | Status: DC

## 2016-05-25 NOTE — Discharge Instructions (Signed)
Braxton Hicks Contractions °Contractions of the uterus can occur throughout pregnancy, but they are not always a sign that you are in labor. You may have practice contractions called Braxton Hicks contractions. These false labor contractions are sometimes confused with true labor. °What are Braxton Hicks contractions? °Braxton Hicks contractions are tightening movements that occur in the muscles of the uterus before labor. Unlike true labor contractions, these contractions do not result in opening (dilation) and thinning of the cervix. Toward the end of pregnancy (32-34 weeks), Braxton Hicks contractions can happen more often and may become stronger. These contractions are sometimes difficult to tell apart from true labor because they can be very uncomfortable. You should not feel embarrassed if you go to the hospital with false labor. °Sometimes, the only way to tell if you are in true labor is for your health care provider to look for changes in the cervix. The health care provider will do a physical exam and may monitor your contractions. If you are not in true labor, the exam should show that your cervix is not dilating and your water has not broken. °If there are no prenatal problems or other health problems associated with your pregnancy, it is completely safe for you to be sent home with false labor. You may continue to have Braxton Hicks contractions until you go into true labor. °How can I tell the difference between true labor and false labor? °· Differences °¨ False labor °¨ Contractions last 30-70 seconds.: Contractions are usually shorter and not as strong as true labor contractions. °¨ Contractions become very regular.: Contractions are usually irregular. °¨ Discomfort is usually felt in the top of the uterus, and it spreads to the lower abdomen and low back.: Contractions are often felt in the front of the lower abdomen and in the groin. °¨ Contractions do not go away with walking.: Contractions may  go away when you walk around or change positions while lying down. °¨ Contractions usually become more intense and increase in frequency.: Contractions get weaker and are shorter-lasting as time goes on. °¨ The cervix dilates and gets thinner.: The cervix usually does not dilate or become thin. °Follow these instructions at home: °¨ Take over-the-counter and prescription medicines only as told by your health care provider. °¨ Keep up with your usual exercises and follow other instructions from your health care provider. °¨ Eat and drink lightly if you think you are going into labor. °¨ If Braxton Hicks contractions are making you uncomfortable: °¨ Change your position from lying down or resting to walking, or change from walking to resting. °¨ Sit and rest in a tub of warm water. °¨ Drink enough fluid to keep your urine clear or pale yellow. Dehydration may cause these contractions. °¨ Do slow and deep breathing several times an hour. °¨ Keep all follow-up prenatal visits as told by your health care provider. This is important. °Contact a health care provider if: °¨ You have a fever. °¨ You have continuous pain in your abdomen. °Get help right away if: °¨ Your contractions become stronger, more regular, and closer together. °¨ You have fluid leaking or gushing from your vagina. °¨ You pass blood-tinged mucus (bloody show). °¨ You have bleeding from your vagina. °¨ You have low back pain that you never had before. °¨ You feel your baby’s head pushing down and causing pelvic pressure. °¨ Your baby is not moving inside you as much as it used to. °Summary °¨ Contractions that occur before labor are   called Braxton Hicks contractions, false labor, or practice contractions.  Braxton Hicks contractions are usually shorter, weaker, farther apart, and less regular than true labor contractions. True labor contractions usually become progressively stronger and regular and they become more frequent.  Manage discomfort from  [facility] contractions by changing position, resting in a warm bath, drinking plenty of water, or practicing deep breathing. This information is not intended to replace advice given to you by your health care provider. Make sure you discuss any questions you have with your health care provider. Document Released: 01/15/2005 Document Revised: 12/05/2015 Document Reviewed: 12/05/2015 Elsevier Interactive Patient Education  2017 [employer]    Vaginal Yeast infection, Adult Vaginal yeast infection is a condition that causes soreness, swelling, and redness (inflammation) of the vagina. It also causes vaginal discharge. This is a common condition. Some women get this infection frequently. What are the causes? This condition is caused by a change in the normal balance of the yeast (candida) and bacteria that live in the vagina. This change causes an overgrowth of yeast, which causes the inflammation. What increases the risk? This condition is more likely to develop in:  Women who take antibiotic medicines.  Women who have diabetes.  Women who take birth control pills.  Women who are pregnant.  Women who douche often.  Women who have a weak defense (immune) system.  Women who have been taking steroid medicines for a long time.  Women who frequently wear tight clothing. What are the signs or symptoms? Symptoms of this condition include:  White, thick vaginal discharge.  Swelling, itching, redness, and irritation of the vagina. The lips of the vagina (vulva) may be affected as well.  Pain or a burning feeling while urinating.  Pain during sex. How is this diagnosed? This condition is diagnosed with a medical history and physical exam. This will include a pelvic exam. Your health care provider will examine a sample of your vaginal discharge under a microscope. Your health care provider may send this sample for testing to confirm the diagnosis. How is this treated? This  condition is treated with medicine. Medicines may be over-the-counter or prescription. You may be told to use one or more of the following:  Medicine that is taken orally.  Medicine that is applied as a cream.  Medicine that is inserted directly into the vagina (suppository). Follow these instructions at home:  Take or apply over-the-counter and prescription medicines only as told by your health care provider.  Do not have sex until your health care provider has approved. Tell your sex partner that you have a yeast infection. That person should go to his or her health care provider if he or she develops symptoms.  Do not wear tight clothes, such as pantyhose or tight pants.  Avoid using tampons until your health care provider approves.  Eat more yogurt. This may help to keep your yeast infection from returning.  Try taking a sitz bath to help with discomfort. This is a warm water bath that is taken while you are sitting down. The water should only come up to your hips and should cover your buttocks. Do this 3-4 times per day or as told by your health care provider.  Do not douche.  Wear breathable, cotton underwear.  If you have diabetes, keep your blood sugar levels under control. Contact a health care provider if:  You have a fever.  Your symptoms go away and then return.  Your symptoms do not get better  with treatment.  Your symptoms get worse.  You have new symptoms.  You develop blisters in or around your vagina.  You have blood coming from your vagina and it is not your menstrual period.  You develop pain in your abdomen. This information is not intended to replace advice given to you by your health care provider. Make sure you discuss any questions you have with your health care provider. Document Released: 10/25/2004 Document Revised: 06/29/2015 Document Reviewed: 07/19/2014 Elsevier Interactive Patient Education  2017 [employer].
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
## 2016-05-25 NOTE — Progress Notes (Signed)
Pt did not get printed Rx Terazol. New Rx sent to Pharmacy.

## 2016-05-26 LAB — CULTURE, OB URINE

## 2016-06-04 ENCOUNTER — Encounter: Payer: Self-pay | Admitting: Certified Nurse Midwife

## 2016-06-04 ENCOUNTER — Ambulatory Visit (INDEPENDENT_AMBULATORY_CARE_PROVIDER_SITE_OTHER): Payer: Medicaid Other | Admitting: Certified Nurse Midwife

## 2016-06-04 VITALS — BP 104/68 | HR 102 | Wt 150.2 lb

## 2016-06-04 DIAGNOSIS — Z789 Other specified health status: Secondary | ICD-10-CM

## 2016-06-04 DIAGNOSIS — Z3483 Encounter for supervision of other normal pregnancy, third trimester: Secondary | ICD-10-CM

## 2016-06-04 DIAGNOSIS — Z3480 Encounter for supervision of other normal pregnancy, unspecified trimester: Secondary | ICD-10-CM

## 2016-06-04 DIAGNOSIS — O9A213 Injury, poisoning and certain other consequences of external causes complicating pregnancy, third trimester: Secondary | ICD-10-CM

## 2016-06-04 DIAGNOSIS — Z23 Encounter for immunization: Secondary | ICD-10-CM

## 2016-06-04 MED ORDER — CYCLOBENZAPRINE HCL 10 MG PO TABS: 10.0000 mg | ORAL_TABLET | Freq: Three times a day (TID) | ORAL | 1 refills | Status: DC | PRN

## 2016-06-04 NOTE — Patient Instructions (Signed)
AREA PEDIATRIC/FAMILY PRACTICE PHYSICIANS  [facility] [address], [address] [place], [place]  [address] Phone - [phone]   Fax - [phone]  [facility] [address] [place], [place] [address] Phone - [phone]   Fax - [phone]  [facility] [address] [place], [place]  [address] Phone - [phone]   Fax - [phone]  [facility] [address] [place], [place]  [address] Phone - [phone]   Fax - [phone]  [facility] [address] [place], [place]  [address] Phone - [phone]   Fax - [phone]  [facility] [address] [place], [place]  [address] Phone - [phone]   Fax - [phone]  [facility] [address] [place], [place]  [address] Phone - [phone]   Fax - [phone]  [facility] [address] [place], [place]  [address] Phone - [phone]   Fax - [phone]  [facility] [address] [place], [place]  [address] Phone - [phone]   Fax - [phone] [facility] [address] [place], [place]  [address] Phone - [phone]   Fax - [phone]  [facility] [address] [place], [place]  [address] Phone - [phone]   Fax - [phone]  [facility] [address] [place], [place]  [address] Phone - [phone]   Fax - [phone]  [facility] [address] [place], [place]  [address] Phone - [phone]   Fax - [phone]  [facility] [address] [place], [place]  [address] Phone - [phone]  [facility] [address] [place], [place]  [address] Phone - [phone]   Fax - [phone]  [facility] [address] [place], [place]  [address] Phone - [phone]   Fax - [phone]  [facility] [address] [place], [place]  [address] Phone - [phone]   Fax - [phone]  [facility] [address] [place], [place]  [address] Phone - [phone]   Fax - [phone]   [facility] [address] [place], [place]  [address] Phone - [phone]   Fax - [phone]  [facility] [address] [place], [place]  [address] Phone - [phone]   Fax - [phone]  [facility] [address] [place], [place]  [address] Phone - [phone]   Fax - [phone]  [facility] [address] [place], [place]  [address] Phone - [phone]   Fax - [phone]  [facility] [address] , [place]  [address] Phone - [phone]   Fax - [phone] [facility] [address] [place], [place]  [address] Phone - [phone]   Fax - [phone]  [facility] [address] [place], [place]  [address] Phone - [phone]   Fax - [phone]  [facility] [address] [place], [place]  [address] Phone - [phone]   Fax - [phone]  [facility] Charlene Flemming MD [address]   [address] Phone [phone]  Fax [phone]   

## 2016-06-04 NOTE — Progress Notes (Signed)
Patient reports good fetal movement and states that she feels some abdominal pressure, denies contractions and bleeding.
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
## 2016-06-13 ENCOUNTER — Ambulatory Visit (HOSPITAL_COMMUNITY)
Admission: RE | Admit: 2016-06-13 | Discharge: 2016-06-13 | Disposition: A | Discharge status: Home / Self Care | Payer: Medicaid Other | Source: Ambulatory Visit | Attending: Certified Nurse Midwife | Admitting: Certified Nurse Midwife

## 2016-06-13 DIAGNOSIS — Z3483 Encounter for supervision of other normal pregnancy, third trimester: Secondary | ICD-10-CM | POA: Insufficient documentation

## 2016-06-13 DIAGNOSIS — Z3480 Encounter for supervision of other normal pregnancy, unspecified trimester: Secondary | ICD-10-CM

## 2016-06-13 DIAGNOSIS — O9A213 Injury, poisoning and certain other consequences of external causes complicating pregnancy, third trimester: Secondary | ICD-10-CM

## 2016-06-14 ENCOUNTER — Other Ambulatory Visit: Payer: Self-pay | Admitting: Certified Nurse Midwife

## 2016-06-14 ENCOUNTER — Other Ambulatory Visit (HOSPITAL_COMMUNITY)
Admission: RE | Admit: 2016-06-14 | Discharge: 2016-06-14 | Disposition: A | Discharge status: Home / Self Care | Payer: Medicaid Other | Source: Ambulatory Visit | Attending: Certified Nurse Midwife | Admitting: Certified Nurse Midwife

## 2016-06-14 ENCOUNTER — Encounter: Payer: Self-pay | Admitting: Certified Nurse Midwife

## 2016-06-14 ENCOUNTER — Ambulatory Visit (INDEPENDENT_AMBULATORY_CARE_PROVIDER_SITE_OTHER): Payer: Medicaid Other | Admitting: Certified Nurse Midwife

## 2016-06-14 VITALS — BP 112/77 | HR 82 | Wt 154.0 lb

## 2016-06-14 DIAGNOSIS — Z3480 Encounter for supervision of other normal pregnancy, unspecified trimester: Secondary | ICD-10-CM

## 2016-06-14 DIAGNOSIS — B373 Candidiasis of vulva and vagina: Secondary | ICD-10-CM

## 2016-06-14 DIAGNOSIS — B3731 Acute candidiasis of vulva and vagina: Secondary | ICD-10-CM

## 2016-06-14 DIAGNOSIS — Z789 Other specified health status: Secondary | ICD-10-CM

## 2016-06-14 DIAGNOSIS — Z3483 Encounter for supervision of other normal pregnancy, third trimester: Secondary | ICD-10-CM

## 2016-06-14 LAB — OB RESULTS CONSOLE GC/CHLAMYDIA: GC PROBE AMP, GENITAL: NEGATIVE

## 2016-06-14 MED ORDER — FLUCONAZOLE 150 MG PO TABS: 150.0000 mg | ORAL_TABLET | Freq: Once | ORAL | 0 refills | Status: AC

## 2016-06-14 MED ORDER — TERCONAZOLE 0.8 % VA CREA: 1.0000 | TOPICAL_CREAM | Freq: Every day | VAGINAL | 0 refills | Status: DC

## 2016-06-14 NOTE — Progress Notes (Signed)
   PRENATAL VISIT NOTE  Subjective:  Heather Savage is a 26 y.o. G2P0010 at 7350w1d being seen today for ongoing prenatal care.  She is currently monitored for the following issues for this low-risk pregnancy and has Hearing loss; Supervision of normal pregnancy, antepartum; Language barrier; Rh negative state in antepartum period; Abnormal TSH; and Large for dates on her problem list.  Patient reports no complaints.  Contractions: Irregular. Vag. Bleeding: None.  Movement: Present. Denies leaking of fluid.   The following portions of the patient's history were reviewed and updated as appropriate: allergies, current medications, past family history, past medical history, past social history, past surgical history and problem list. Problem list updated.  Objective:   Vitals:   06/14/16 1453  BP: 112/77  Pulse: 82  Weight: 154 lb (69.9 kg)    Fetal Status: Fetal Heart Rate (bpm): 156 Fundal Height: 36 cm Movement: Present  Presentation: Vertex  General:  Alert, oriented and cooperative. Patient is in no acute distress.  Skin: Skin is warm and dry. No rash noted.   Cardiovascular: Normal heart rate noted  Respiratory: Normal respiratory effort, no problems with respiration noted  Abdomen: Soft, gravid, appropriate for gestational age. Pain/Pressure: Absent     Pelvic:  Cervical exam performed Dilation: Closed Effacement (%): 20 Station: -3  Extremities: Normal range of motion.  Edema: Trace  Mental Status: Normal mood and affect. Normal behavior. Normal judgment and thought content.   Assessment and Plan:  Pregnancy: G2P0010 at 7350w1d  1. Language barrier    Spouse as Nurse, learning disabilitytranslator.   2. Supervision of other normal pregnancy, antepartum     Doing well - Strep Gp B NAA - Cervicovaginal ancillary only  3. Large for dates     On US: 82% EFW, repeat in 3 weeks if not delivered.   4. Yeast vaginitis      - fluconazole (DIFLUCAN) 150 MG tablet; Take 1 tablet (150 mg total) by  mouth once.  Dispense: 1 tablet; Refill: 0 - terconazole (TERAZOL 3) 0.8 % vaginal cream; Place 1 applicator vaginally at bedtime.  Dispense: 20 g; Refill: 0  Preterm labor symptoms and general obstetric precautions including but not limited to vaginal bleeding, contractions, leaking of fluid and fetal movement were reviewed in detail with the patient. Please refer to After Visit Summary for other counseling recommendations.  Return in about 1 week (around 06/21/2016) for ROB.   Roe Coombsachelle A Cristin Szatkowski, CNM

## 2016-06-14 NOTE — Progress Notes (Signed)
Patient reports no concerns today 

## 2016-06-15 LAB — CERVICOVAGINAL ANCILLARY ONLY
Bacterial vaginitis: NEGATIVE
CANDIDA VAGINITIS: POSITIVE — AB
CHLAMYDIA, DNA PROBE: NEGATIVE
NEISSERIA GONORRHEA: NEGATIVE
TRICH (WINDOWPATH): NEGATIVE

## 2016-06-16 LAB — STREP GP B NAA: Strep Gp B NAA: NEGATIVE

## 2016-06-18 ENCOUNTER — Other Ambulatory Visit: Payer: Self-pay | Admitting: Certified Nurse Midwife

## 2016-06-18 DIAGNOSIS — Z3480 Encounter for supervision of other normal pregnancy, unspecified trimester: Secondary | ICD-10-CM

## 2016-06-20 ENCOUNTER — Encounter: Payer: Self-pay | Admitting: Certified Nurse Midwife

## 2016-06-20 ENCOUNTER — Ambulatory Visit (INDEPENDENT_AMBULATORY_CARE_PROVIDER_SITE_OTHER): Payer: Medicaid Other | Admitting: Certified Nurse Midwife

## 2016-06-20 VITALS — BP 103/70 | HR 90 | Wt 154.0 lb

## 2016-06-20 DIAGNOSIS — B373 Candidiasis of vulva and vagina: Secondary | ICD-10-CM

## 2016-06-20 DIAGNOSIS — Z6791 Unspecified blood type, Rh negative: Secondary | ICD-10-CM

## 2016-06-20 DIAGNOSIS — O26899 Other specified pregnancy related conditions, unspecified trimester: Secondary | ICD-10-CM

## 2016-06-20 DIAGNOSIS — Z3483 Encounter for supervision of other normal pregnancy, third trimester: Secondary | ICD-10-CM

## 2016-06-20 DIAGNOSIS — Z3480 Encounter for supervision of other normal pregnancy, unspecified trimester: Secondary | ICD-10-CM

## 2016-06-20 DIAGNOSIS — B3731 Acute candidiasis of vulva and vagina: Secondary | ICD-10-CM

## 2016-06-20 MED ORDER — FLUCONAZOLE 150 MG PO TABS: 150.0000 mg | ORAL_TABLET | Freq: Once | ORAL | 0 refills | Status: AC

## 2016-06-20 MED ORDER — TERCONAZOLE 0.8 % VA CREA: 1.0000 | TOPICAL_CREAM | Freq: Every day | VAGINAL | 0 refills | Status: DC

## 2016-06-20 NOTE — Progress Notes (Signed)
   PRENATAL VISIT NOTE  Subjective:  Heather Savage is a 26 y.o. G2P0010 at 4470w0d being seen today for ongoing prenatal care.  She is currently monitored for the following issues for this low-risk pregnancy and has Hearing loss; Supervision of normal pregnancy, antepartum; Language barrier; Rh negative state in antepartum period; Abnormal TSH; and Large for dates on her problem list.  Patient reports no bleeding, no contractions, no cramping, no leaking and vaginal irritation.  Contractions: Irregular. Vag. Bleeding: None.  Movement: Present. Denies leaking of fluid.   The following portions of the patient's history were reviewed and updated as appropriate: allergies, current medications, past family history, past medical history, past social history, past surgical history and problem list. Problem list updated.  Objective:   Vitals:   06/20/16 1525  BP: 103/70  Pulse: 90  Weight: 154 lb (69.9 kg)    Fetal Status: Fetal Heart Rate (bpm): 150 Fundal Height: 37 cm Movement: Present  Presentation: Vertex  General:  Alert, oriented and cooperative. Patient is in no acute distress.  Skin: Skin is warm and dry. No rash noted.   Cardiovascular: Normal heart rate noted  Respiratory: Normal respiratory effort, no problems with respiration noted  Abdomen: Soft, gravid, appropriate for gestational age. Pain/Pressure: Present     Pelvic:  Cervical exam performed Dilation: Closed Effacement (%): 50 Station: -2  Extremities: Normal range of motion.     Mental Status: Normal mood and affect. Normal behavior. Normal judgment and thought content.   Assessment and Plan:  Pregnancy: G2P0010 at 2970w0d  1. Supervision of other normal pregnancy, antepartum     ?LGA, f/u growth scheduled.  - US MFM OB FOLLOW UP; Future  2. Rh negative state in antepartum period      Rhogam given 04/18/16  3. Yeast vaginitis      - terconazole (TERAZOL 3) 0.8 % vaginal cream; Place 1 applicator vaginally at  bedtime.  Dispense: 20 g; Refill: 0 - fluconazole (DIFLUCAN) 150 MG tablet; Take 1 tablet (150 mg total) by mouth once.  Dispense: 1 tablet; Refill: 0  Term labor symptoms and general obstetric precautions including but not limited to vaginal bleeding, contractions, leaking of fluid and fetal movement were reviewed in detail with the patient. Please refer to After Visit Summary for other counseling recommendations.  Return in about 1 week (around 06/27/2016) for ROB.   Roe Coombsachelle A Kori Goins, CNM

## 2016-06-20 NOTE — Progress Notes (Signed)
Pt states that she is now having greenish d/c. Did not get yeast medication. Would like check today. Pt states that she has been having a "burning" feeling in her hands/ feet.

## 2016-06-21 DIAGNOSIS — R946 Abnormal results of thyroid function studies: Secondary | ICD-10-CM | POA: Diagnosis not present

## 2016-06-22 ENCOUNTER — Other Ambulatory Visit: Payer: Self-pay | Admitting: Certified Nurse Midwife

## 2016-06-22 DIAGNOSIS — R7989 Other specified abnormal findings of blood chemistry: Secondary | ICD-10-CM

## 2016-06-27 ENCOUNTER — Encounter (HOSPITAL_COMMUNITY): Payer: Self-pay | Admitting: *Deleted

## 2016-06-27 ENCOUNTER — Inpatient Hospital Stay (HOSPITAL_COMMUNITY)
Admission: AD | Admit: 2016-06-27 | Discharge: 2016-06-29 | DRG: 775 | Disposition: A | Discharge status: Home / Self Care | Payer: Medicaid Other | Source: Ambulatory Visit | Attending: Family Medicine | Admitting: Family Medicine

## 2016-06-27 DIAGNOSIS — O26893 Other specified pregnancy related conditions, third trimester: Secondary | ICD-10-CM | POA: Diagnosis present

## 2016-06-27 DIAGNOSIS — Z3493 Encounter for supervision of normal pregnancy, unspecified, third trimester: Secondary | ICD-10-CM | POA: Diagnosis present

## 2016-06-27 DIAGNOSIS — Z3A38 38 weeks gestation of pregnancy: Secondary | ICD-10-CM

## 2016-06-27 DIAGNOSIS — D649 Anemia, unspecified: Secondary | ICD-10-CM | POA: Diagnosis present

## 2016-06-27 DIAGNOSIS — O9902 Anemia complicating childbirth: Secondary | ICD-10-CM | POA: Diagnosis present

## 2016-06-27 DIAGNOSIS — Z6791 Unspecified blood type, Rh negative: Secondary | ICD-10-CM

## 2016-06-27 DIAGNOSIS — Z3480 Encounter for supervision of other normal pregnancy, unspecified trimester: Secondary | ICD-10-CM

## 2016-06-27 LAB — ABO/RH: ABO/RH(D): O NEG

## 2016-06-27 LAB — TYPE AND SCREEN
ABO/RH(D): O NEG
ANTIBODY SCREEN: NEGATIVE

## 2016-06-27 LAB — CBC
HEMATOCRIT: 31 % — AB (ref 36.0–46.0)
HEMOGLOBIN: 10.7 g/dL — AB (ref 12.0–15.0)
MCH: 28.8 pg (ref 26.0–34.0)
MCHC: 34.5 g/dL (ref 30.0–36.0)
MCV: 83.6 fL (ref 78.0–100.0)
Platelets: 199 10*3/uL (ref 150–400)
RBC: 3.71 MIL/uL — AB (ref 3.87–5.11)
RDW: 13.5 % (ref 11.5–15.5)
WBC: 6.9 10*3/uL (ref 4.0–10.5)

## 2016-06-27 LAB — RPR: RPR: NONREACTIVE

## 2016-06-27 MED ORDER — FLEET ENEMA 7-19 GM/118ML RE ENEM: 1.0000 | ENEMA | RECTAL | Status: DC | PRN

## 2016-06-27 MED ORDER — OXYCODONE HCL 5 MG PO TABS
5.0000 mg | ORAL_TABLET | ORAL | Status: DC | PRN
  Administered 2016-06-28 – 2016-06-29 (×2): 5 mg via ORAL
  Filled 2016-06-27 (×2): qty 1

## 2016-06-27 MED ORDER — SIMETHICONE 80 MG PO CHEW: 80.0000 mg | CHEWABLE_TABLET | ORAL | Status: DC | PRN

## 2016-06-27 MED ORDER — ZOLPIDEM TARTRATE 5 MG PO TABS: 5.0000 mg | ORAL_TABLET | Freq: Every evening | ORAL | Status: DC | PRN

## 2016-06-27 MED ORDER — LACTATED RINGERS IV SOLN: 500.0000 mL | INTRAVENOUS | Status: DC | PRN

## 2016-06-27 MED ORDER — ONDANSETRON HCL 4 MG/2ML IJ SOLN: 4.0000 mg | Freq: Four times a day (QID) | INTRAMUSCULAR | Status: DC | PRN

## 2016-06-27 MED ORDER — OXYTOCIN 40 UNITS IN LACTATED RINGERS INFUSION - SIMPLE MED
1.0000 m[IU]/min | INTRAVENOUS | Status: DC
  Administered 2016-06-27: 2 m[IU]/min via INTRAVENOUS

## 2016-06-27 MED ORDER — TETANUS-DIPHTH-ACELL PERTUSSIS 5-2.5-18.5 LF-MCG/0.5 IM SUSP: 0.5000 mL | Freq: Once | INTRAMUSCULAR | Status: DC

## 2016-06-27 MED ORDER — TERBUTALINE SULFATE 1 MG/ML IJ SOLN
0.2500 mg | Freq: Once | INTRAMUSCULAR | Status: DC | PRN
  Filled 2016-06-27: qty 1

## 2016-06-27 MED ORDER — LIDOCAINE HCL (PF) 1 % IJ SOLN
30.0000 mL | INTRAMUSCULAR | Status: AC | PRN
  Administered 2016-06-27: 30 mL via SUBCUTANEOUS
  Filled 2016-06-27: qty 30

## 2016-06-27 MED ORDER — ONDANSETRON HCL 4 MG PO TABS
4.0000 mg | ORAL_TABLET | ORAL | Status: DC | PRN
  Administered 2016-06-29: 4 mg via ORAL
  Filled 2016-06-27: qty 1

## 2016-06-27 MED ORDER — SOD CITRATE-CITRIC ACID 500-334 MG/5ML PO SOLN: 30.0000 mL | ORAL | Status: DC | PRN

## 2016-06-27 MED ORDER — DIPHENHYDRAMINE HCL 25 MG PO CAPS: 25.0000 mg | ORAL_CAPSULE | Freq: Four times a day (QID) | ORAL | Status: DC | PRN

## 2016-06-27 MED ORDER — WITCH HAZEL-GLYCERIN EX PADS: 1.0000 "application " | MEDICATED_PAD | CUTANEOUS | Status: DC | PRN

## 2016-06-27 MED ORDER — FENTANYL CITRATE (PF) 100 MCG/2ML IJ SOLN
100.0000 ug | INTRAMUSCULAR | Status: DC | PRN
  Administered 2016-06-27 (×2): 100 ug via INTRAVENOUS
  Filled 2016-06-27 (×3): qty 2

## 2016-06-27 MED ORDER — MISOPROSTOL 200 MCG PO TABS
ORAL_TABLET | ORAL | Status: AC
  Administered 2016-06-27: 800 ug via RECTAL
  Filled 2016-06-27: qty 4

## 2016-06-27 MED ORDER — OXYCODONE-ACETAMINOPHEN 5-325 MG PO TABS
2.0000 | ORAL_TABLET | ORAL | Status: DC | PRN
  Administered 2016-06-27: 2 via ORAL
  Filled 2016-06-27: qty 2

## 2016-06-27 MED ORDER — MISOPROSTOL 200 MCG PO TABS
800.0000 ug | ORAL_TABLET | Freq: Once | ORAL | Status: AC
  Administered 2016-06-27: 800 ug via RECTAL

## 2016-06-27 MED ORDER — ACETAMINOPHEN 325 MG PO TABS
650.0000 mg | ORAL_TABLET | ORAL | Status: DC | PRN
Start: 2016-06-27 — End: 2016-06-29

## 2016-06-27 MED ORDER — PRENATAL MULTIVITAMIN CH
1.0000 | ORAL_TABLET | Freq: Every day | ORAL | Status: DC
  Administered 2016-06-28 – 2016-06-29 (×2): 1 via ORAL
  Filled 2016-06-27 (×2): qty 1

## 2016-06-27 MED ORDER — OXYTOCIN BOLUS FROM INFUSION
500.0000 mL | Freq: Once | INTRAVENOUS | Status: AC
  Administered 2016-06-27: 500 mL via INTRAVENOUS

## 2016-06-27 MED ORDER — LACTATED RINGERS IV SOLN
INTRAVENOUS | Status: DC
  Administered 2016-06-27: 07:00:00 via INTRAVENOUS

## 2016-06-27 MED ORDER — OXYTOCIN 40 UNITS IN LACTATED RINGERS INFUSION - SIMPLE MED
2.5000 [IU]/h | INTRAVENOUS | Status: DC
  Administered 2016-06-27: 2.5 [IU]/h via INTRAVENOUS
  Filled 2016-06-27: qty 1000

## 2016-06-27 MED ORDER — ONDANSETRON HCL 4 MG/2ML IJ SOLN
4.0000 mg | INTRAMUSCULAR | Status: DC | PRN
Start: 2016-06-27 — End: 2016-06-29

## 2016-06-27 MED ORDER — ACETAMINOPHEN 325 MG PO TABS: 650.0000 mg | ORAL_TABLET | ORAL | Status: DC | PRN

## 2016-06-27 MED ORDER — OXYCODONE HCL 5 MG PO TABS
10.0000 mg | ORAL_TABLET | ORAL | Status: DC | PRN
  Administered 2016-06-29: 10 mg via ORAL
  Filled 2016-06-27: qty 2

## 2016-06-27 MED ORDER — SENNOSIDES-DOCUSATE SODIUM 8.6-50 MG PO TABS
2.0000 | ORAL_TABLET | ORAL | Status: DC
  Administered 2016-06-27 – 2016-06-28 (×2): 2 via ORAL
  Filled 2016-06-27 (×2): qty 2

## 2016-06-27 MED ORDER — DIBUCAINE 1 % RE OINT: 1.0000 "application " | TOPICAL_OINTMENT | RECTAL | Status: DC | PRN

## 2016-06-27 MED ORDER — IBUPROFEN 600 MG PO TABS
600.0000 mg | ORAL_TABLET | Freq: Four times a day (QID) | ORAL | Status: DC
  Administered 2016-06-27 – 2016-06-29 (×8): 600 mg via ORAL
  Filled 2016-06-27 (×8): qty 1

## 2016-06-27 MED ORDER — COCONUT OIL OIL: 1.0000 "application " | TOPICAL_OIL | Status: DC | PRN

## 2016-06-27 MED ORDER — OXYCODONE-ACETAMINOPHEN 5-325 MG PO TABS: 1.0000 | ORAL_TABLET | ORAL | Status: DC | PRN

## 2016-06-27 MED ORDER — BENZOCAINE-MENTHOL 20-0.5 % EX AERO
1.0000 "application " | INHALATION_SPRAY | CUTANEOUS | Status: DC | PRN
  Administered 2016-06-29: 1 via TOPICAL
  Filled 2016-06-27 (×2): qty 56

## 2016-06-27 NOTE — Progress Notes (Signed)
Labor Progress Note Loma BostonFiyery M Pauley is a 26 y.o. G2P0010 at 9025w0d presented for SOL/SROM  S: Had pain relief with fentanly. Having frequent intense contractions. Coping well. Surrounded by supportive family. Boyfriend interprets for her.   O:  BP 109/66   Pulse 79   Temp 98.1 F (36.7 C) (Oral)   Resp 16   LMP 10/05/2015  EFM: Cat I   CVE: Dilation: 7.5 Effacement (%): 90 Cervical Position: Posterior Station: -2 Presentation: Vertex Exam by:: M.Lee   A&P: 26 y.o. G2P0010 7425w0d here with term SOL/SROM #Labor: Progressing well. Continue expectant management  #Pain: IV opiates for now. Considering nitrous once more active.  #FWB: Cat I  #GBS negative #O Neg Blood Type: fetal blood type and determine need for pp rhogam   Al CorpusMatthew R Teodoro Jeffreys, MD 11:14 AM

## 2016-06-27 NOTE — H&P (Signed)
Heather Savage is a 26 y.o. female G2P0010 @[redacted]w[redacted]d  pt of [facility] [place] presenting for SROM and active labor.  She reports irregular cramping/contractions early this morning followed by gush of clear fluid at 3:30 am.  Contractions, which are mostly reported as back pain, started to become more painful after fluid leakage.  She reports good fetal movement, denies vaginal bleeding, vaginal itching/burning, urinary symptoms, h/a, dizziness, n/v, or fever/chills.    Pregnancy has been uncomplicated but size > dates and growth US indicated 81%tile at 36 weeks.  Pt declined language line for Amharic language. She prefers to use her husband for translation as needed.  Pt speaks some English.   Clinic  [facility] Prenatal Labs  Dating LMP Blood type: O/Negative/-- (12/04 1632)   Genetic Screen 1 Screen:    AFP: normal    Quad:     NIPS:Mat21:normal Antibody:Negative (03/21 1030)  Anatomic US Normal @20wks ; female fetus; dating c/w LMP Rubella: 9.87 (12/04 1632)  GTT  Third trimester: WNL RPR: Non Reactive (03/21 1030)   Flu vaccine 01-31-16 HBsAg: Negative (12/04 1632)   TDaP vaccine  info given                    Rhogam:04/18/16 HIV: Non Reactive (03/21 1030)   Baby Food     Breast                               WGN:FAOZHYQMGBS:Negative (05/17 1656)   Contraception Declines Pap: negative 1/18  Circumcision Yes; femina   Pediatrician [facility]   Support Person Spouse     OB History    Gravida Para Term Preterm AB Living   2 0 0 0 1     SAB TAB Ectopic Multiple Live Births   1 0 0         Past Medical History:  Diagnosis Date  . Anemia    states has low blood count, no current meds.  Marland Kitchen. Hearing loss    right  . History of gastritis    no current med.  Marland Kitchen. History of malaria 2012  . Rh negative, maternal   . Tympanic membrane perforation 05/2012   right   Past Surgical History:  Procedure Laterality Date  . SCAR REVISION  08/27/2011   Procedure: SCAR REVISION;  Surgeon: Louisa SecondGerald Truesdale, MD;   Location: [facility];  Service: Plastics;  Laterality: N/A;  scar revision of forehead  . TYMPANOPLASTY  05/14/2011   Procedure: TYMPANOPLASTY;  Surgeon: Serena ColonelJefry Rosen, MD;  Location: [facility];  Service: ENT;  Laterality: Left;  TYMPANOPLASYT AND  POSSIBLE OSSICULARPLASTY   . TYMPANOPLASTY  10/10/2011   Procedure: TYMPANOPLASTY;  Surgeon: Serena ColonelJefry Rosen, MD;  [facility];  Service: ENT;  Laterality: Right;  . TYMPANOPLASTY Right 06/30/2012   Procedure: REVISION RIGHT TYMPANOPLASTY;  Surgeon: Serena ColonelJefry Rosen, MD;  Location: [facility];  Service: ENT;  Laterality: Right;   Family History: family history is not on file. Social History:  reports that she has never smoked. She has never used smokeless tobacco. She reports that she does not drink alcohol or use drugs.     Maternal Diabetes: No Genetic Screening: Normal Maternal Ultrasounds/Referrals: Normal Fetal Ultrasounds or other Referrals:  None Maternal Substance Abuse:  No Significant Maternal Medications:  None Significant Maternal Lab Results:  Lab values include: Group B Strep negative Other Comments:  None  ROS  Maternal Medical History:  Reason for admission: Rupture of membranes and contractions.   Contractions: Onset was 1-2 hours ago.   Frequency: regular.   Perceived severity is moderate.    Fetal activity: Perceived fetal activity is normal.   Last perceived fetal movement was within the past hour.    Prenatal complications: no prenatal complications Prenatal Complications - Diabetes: none.    Dilation: 5 Effacement (%): 80 Station: -3 Exam by:: Leftwich kirby  CNM  Anterior, vertex   Blood pressure 110/80, pulse 85, temperature 98.7 F (37.1 C), temperature source Oral, resp. rate (!) 22, last menstrual period 10/05/2015. Maternal Exam:  Uterine Assessment: Contraction strength is moderate.  Contraction frequency is regular.   Abdomen: Estimated fetal weight is 81%tile  at 36 weeks.   Fetal presentation: vertex  Cervix: Cervix evaluated by digital exam.     Fetal Exam Fetal Monitor Review: Mode: ultrasound.   Baseline rate: 135.  Variability: moderate (6-25 bpm).   Pattern: accelerations present and no decelerations.    Fetal State Assessment: Category I - tracings are normal.     Physical Exam  Nursing note and vitals reviewed. Constitutional: She is oriented to person, place, and time. She appears well-developed and well-nourished.  Neck: Normal range of motion.  Cardiovascular: Normal rate.   Respiratory: Effort normal.  GI: Soft.  Musculoskeletal: Normal range of motion.  Neurological: She is alert and oriented to person, place, and time.  Skin: Skin is warm and dry.  Psychiatric: She has a normal mood and affect. Her behavior is normal. Judgment and thought content normal.    Prenatal labs: ABO, Rh: O/Negative/-- (12/04 1632) Antibody: Negative (03/21 1030) Rubella: 9.87 (12/04 1632) RPR: Non Reactive (03/21 1030)  HBsAg: Negative (12/04 1632)  HIV: Non Reactive (03/21 1030)  GBS: Negative (05/17 1656)   Assessment/Plan: G1 @[redacted]w[redacted]d  by LMP SROM at term Active labor GBS negative  Admit to [facility] Expectant management Anticipate NSVD    Sharen Counter 06/27/2016, 4:53 AM

## 2016-06-27 NOTE — MAU Note (Signed)
PT  SAYS  SROM AT 0330

## 2016-06-27 NOTE — Lactation Note (Signed)
This note was copied from a baby's chart. Lactation Consultation Note  Patient Name: Heather Savage ZOXWR'UToday's Date: 06/27/2016 Reason for consult: [facility] FOB present to interpreter for Mom. Assisted Mom with positioning and sustaining good depth with latch. Baby demonstrated good suckling bursts off/on for 25 minutes. Basic teaching reviewed with parents. Encouraged to BF with feeding ques, 8-12 times or more in 24 hours. Lactation brochure left for review, advised of OP services and support group. Encouraged to call for assist as needed, questions/concerns.   Maternal Data Has patient been taught Hand Expression?: Yes Does the patient have breastfeeding experience prior to this delivery?: No  Feeding Feeding Type: Breast Fed Length of feed: 25 min  LATCH Score/Interventions Latch: Repeated attempts needed to sustain latch, nipple held in mouth throughout feeding, stimulation needed to elicit sucking reflex. Intervention(s): Adjust position;Assist with latch;Breast massage;Breast compression  Audible Swallowing: A few with stimulation  Type of Nipple: Everted at rest and after stimulation  Comfort (Breast/Nipple): Soft / non-tender     Hold (Positioning): Full assist, staff holds infant at breast Intervention(s): Breastfeeding basics reviewed;Support Pillows;Position options;Skin to skin  LATCH Score: 6  Lactation Tools Discussed/Used WIC Program: Yes   Consult Status Consult Status: Follow-up Date: 06/28/16 Follow-up type: In-patient    Alfred LevinsGranger, Faraaz Wolin Ann 06/27/2016, 4:32 PM

## 2016-06-28 ENCOUNTER — Encounter: Payer: Self-pay | Admitting: Certified Nurse Midwife

## 2016-06-28 MED ORDER — FERROUS SULFATE 325 (65 FE) MG PO TABS
325.0000 mg | ORAL_TABLET | Freq: Two times a day (BID) | ORAL | Status: DC
  Administered 2016-06-28 – 2016-06-29 (×3): 325 mg via ORAL
  Filled 2016-06-28 (×3): qty 1

## 2016-06-28 NOTE — Progress Notes (Signed)
Post Partum Day #1 Subjective: no complaints, up ad lib, voiding and tolerating PO  Objective: Blood pressure 106/60, pulse 96, temperature 98.5 F (36.9 C), temperature source Oral, resp. rate 14, height 5\' 4"  (1.626 m), weight 153 lb (69.4 kg), last menstrual period 10/05/2015, unknown if currently breastfeeding.  Physical Exam:  General: alert, cooperative and no distress Lochia: appropriate, small clot noted Uterine Fundus: firm Incision: 2nd deg; healing DVT Evaluation: No evidence of DVT seen on physical exam. No cords or calf tenderness. No significant calf/ankle edema.   Recent Labs  06/27/16 0435  HGB 10.7*  HCT 31.0*    Assessment/Plan: Plan for discharge tomorrow, Breastfeeding and Contraception declined.  Anemia: iron ordered. Watch lochia.    LOS: 1 day   Roe CoombsRachelle A Zayvion Stailey, CNM 06/28/2016, 7:48 AM

## 2016-06-29 LAB — URINALYSIS, ROUTINE W REFLEX MICROSCOPIC
Bilirubin Urine: NEGATIVE
GLUCOSE, UA: NEGATIVE mg/dL
KETONES UR: NEGATIVE mg/dL
NITRITE: NEGATIVE
PROTEIN: NEGATIVE mg/dL
Specific Gravity, Urine: 1.011 (ref 1.005–1.030)
pH: 5 (ref 5.0–8.0)

## 2016-06-29 MED ORDER — OXYCODONE-ACETAMINOPHEN 5-325 MG PO TABS: 1.0000 | ORAL_TABLET | ORAL | 0 refills | Status: DC | PRN

## 2016-06-29 MED ORDER — DOCUSATE SODIUM 100 MG PO CAPS: 100.0000 mg | ORAL_CAPSULE | Freq: Two times a day (BID) | ORAL | 0 refills | Status: DC

## 2016-06-29 MED ORDER — IBUPROFEN 600 MG PO TABS: 600.0000 mg | ORAL_TABLET | Freq: Four times a day (QID) | ORAL | 0 refills | Status: DC

## 2016-06-29 NOTE — Lactation Note (Addendum)
This note was copied from a baby's chart. Lactation Consultation Note FOB interpreter for mom. Encouraged FOB if he didn't understand anything we say and needs an interpreter please tell us. He stated ok. FOB asked appropriate question in discussions. Mom doesn't understand English.  Baby bites mom's nipples. Has no coordinated suck, bites. Suck training w/gloved finger not helpful. Will not extend his tongue past gum line to cup gloved finger for sucking. Parents had given formula tonight d/t couldn't tolerate BF. Mom prefers to BF.  Discussed pump and bottle feeding. Mom shown how to use DEBP & how to disassemble, clean, & reassemble parts. Mom encouraged to waken baby for feeds.  Mom encouraged to feed baby 8-12 times/24 hours and with feeding cues. Mom and fob letting baby sleepy w/o waking up to feed.  Mom gave Similac 42 ml. Gave feeding sheet, reviewed w/FOB. He didn't review w/mom. explained gave baby to much. Demonstrated lines of amount on bottle.  Baby nose very stuff, has congestion in chest. RN notified, gave baby nose drops saline.  Mom grimacing so bad about to cry while baby was on breast. Adjusted flange, unlatched, re-latched. Baby appeared to have good positioning.  Patient Name: Heather Savage ZOXWR'UToday's Date: 06/29/2016 Reason for consult: Follow-up assessment   Maternal Data    Feeding Feeding Type: Formula Nipple Type: Slow - flow Length of feed: 5 min  LATCH Score/Interventions Latch: Repeated attempts needed to sustain latch, nipple held in mouth throughout feeding, stimulation needed to elicit sucking reflex. Intervention(s): Adjust position;Assist with latch;Breast massage;Breast compression  Audible Swallowing: None Intervention(s): Hand expression Intervention(s): Skin to skin;Hand expression;Alternate breast massage  Type of Nipple: Everted at rest and after stimulation  Comfort (Breast/Nipple): Filling, red/small blisters or bruises, mild/mod  discomfort  Problem noted: Mild/Moderate discomfort Interventions (Mild/moderate discomfort): Hand massage;Hand expression;Pre-pump if needed;Post-pump;Breast shields  Hold (Positioning): Full assist, staff holds infant at breast Intervention(s): Breastfeeding basics reviewed;Support Pillows;Position options;Skin to skin  LATCH Score: 4  Lactation Tools Discussed/Used Tools: Pump Breast pump type: Double-Electric Breast Pump Pump Review: Setup, frequency, and cleaning;Milk Storage Initiated by:: Peri JeffersonL. Joud Pettinato RN IBCLC Date initiated:: 06/29/16   Consult Status Consult Status: Follow-up Date: 06/29/16 (in pm) Follow-up type: In-patient    Charyl DancerCARVER, [name] 06/29/2016, 4:58 AM

## 2016-06-29 NOTE — Lactation Note (Signed)
This note was copied from a baby's chart. Lactation Consultation Note  Patient Name: Heather Savage Heather Savage Reason for consult: Follow-up assessment Baby at 46 hr of life. Upon entry parents were placing baby in the car seat. Mom stated bf is "ok" and declined OP apt. She is aware of lactation services and support group. Parents will call as needed.   Maternal Data    Feeding Feeding Type: Bottle Fed - Formula Nipple Type: Slow - flow  LATCH Score/Interventions                      Lactation Tools Discussed/Used     Consult Status Consult Status: Complete    [name], 12:34 PM

## 2016-06-29 NOTE — Lactation Note (Signed)
This note was copied from a baby's chart. Lactation Consultation Note LC does not advise discharge d/t feedings. Mom unable to tolerate BF d/t pain. DEBP set up, has no colostrum. Hand expression w/a dot of colostrum noted. LC will f/u today for assistance. Baby bites, doesn't suckle on gloved finger. Will take bottle. Supplementing until milk comes. Patient Name: Heather Janus MolderFiyery Sisneros ZOXWR'UToday's Date: Savage Reason for consult: Follow-up assessment   Maternal Data    Feeding Feeding Type: Formula Nipple Type: Slow - flow Length of feed: 5 min  LATCH Score/Interventions Latch: Repeated attempts needed to sustain latch, nipple held in mouth throughout feeding, stimulation needed to elicit sucking reflex. Intervention(s): Adjust position;Assist with latch;Breast massage;Breast compression  Audible Swallowing: None Intervention(s): Hand expression Intervention(s): Skin to skin;Hand expression;Alternate breast massage  Type of Nipple: Everted at rest and after stimulation  Comfort (Breast/Nipple): Filling, red/small blisters or bruises, mild/mod discomfort  Problem noted: Mild/Moderate discomfort Interventions (Mild/moderate discomfort): Hand massage;Hand expression;Pre-pump if needed;Post-pump;Breast shields  Hold (Positioning): Full assist, staff holds infant at breast Intervention(s): Breastfeeding basics reviewed;Support Pillows;Position options;Skin to skin  LATCH Score: 4  Lactation Tools Discussed/Used Tools: Pump Breast pump type: Double-Electric Breast Pump Pump Review: Setup, frequency, and cleaning;Milk Storage Initiated by:: Peri JeffersonL. Seger Jani RN IBCLC Date initiated:: 06/29/16   Consult Status Consult Status: Follow-up Date: 06/29/16 (in pm) Follow-up type: In-patient    Heather Savage, Heather Savage Savage, 6:25 AM

## 2016-06-29 NOTE — Discharge Instructions (Signed)
Contraception Choices °Contraception, also called birth control, means things to use or ways to try not to get pregnant. °Hormonal birth control °This kind of birth control uses hormones. Here are some types of hormonal birth control: °· A tube that is put under skin of the arm (implant). The tube can stay in for as long as 3 years. °· Shots to get every 3 months (injections). °· Pills to take every day (birth control pills). °· A patch to change 1 time each week for 3 weeks (birth control patch). After that, the patch is taken off for 1 week. °· A ring to put in the vagina. The ring is left in for 3 weeks. Then it is taken out of the vagina for 1 week. Then a new ring is put in. °· Pills to take after unprotected sex (emergency birth control pills). ° °Barrier birth control °Here are some types of barrier birth control: °· A thin covering that is put on the penis before sex (female condom). The covering is thrown away after sex. °· A soft, loose covering that is put in the vagina before sex (female condom). The covering is thrown away after sex. °· A rubber bowl that sits over the cervix (diaphragm). The bowl must be made for you. The bowl is put into the vagina before sex. The bowl is left in for 6-8 hours after sex. It is taken out within 24 hours. °· A small, soft cup that fits over the cervix (cervical cap). The cup must be made for you. The cup can be left in for 6-8 hours after sex. It is taken out within 48 hours. °· A sponge that is put into the vagina before sex. It must be left in for at least 6 hours after sex. It must be taken out within 30 hours. Then it is thrown away. °· A chemical that kills or stops sperm from getting into the uterus (spermicide). It may be a pill, cream, jelly, or foam to put in the vagina. The chemical should be used at least 10-15 minutes before sex. ° °IUD (intrauterine) birth control °An IUD is a small, T-shaped piece of plastic. It is put inside the uterus. There are two  kinds: °· Hormone IUD. This kind can stay in for 3-5 years. °· Copper IUD. This kind can stay in for 10 years. ° °Permanent birth control °Here are some types of permanent birth control: °· Surgery to block the fallopian tubes. °· Having an insert put into each fallopian tube. °· Surgery to tie off the tubes that carry sperm (vasectomy). ° °Natural planning birth control °Here are some types of natural planning birth control: °· Not having sex on the days the woman could get pregnant. °· Using a calendar: °? To keep track of the length of each period. °? To find out what days pregnancy can happen. °? To plan to not have sex on days when pregnancy can happen. °· Watching for symptoms of ovulation and not having sex during ovulation. One way the woman can check for ovulation is to check her temperature. °· Waiting to have sex until after ovulation. ° °Summary °· Contraception, also called birth control, means things to use or ways to try not to get pregnant. °· Hormonal methods of birth control include implants, injections, pills, patches, vaginal rings, and emergency birth control pills. °· Barrier methods of birth control can include female condoms, female condoms, diaphragms, cervical caps, sponges, and spermicides. °· There are two types of   IUD (intrauterine device) birth control. An IUD can be put in a woman's uterus to prevent pregnancy for 3-5 years.  Permanent sterilization can be done through a procedure for males, females, or both.  Natural planning methods involve not having sex on the days when the woman could get pregnant. This information is not intended to replace advice given to you by your health care provider. Make sure you discuss any questions you have with your health care provider. Document Released: 11/12/2008 Document Revised: 01/26/2016 Document Reviewed: 01/26/2016 Elsevier Interactive Patient Education  2017 [employer] Vaginal Delivery, Care After Refer to this sheet in the next  few weeks. These instructions provide you with information about caring for yourself after vaginal delivery. Your health care provider may also give you more specific instructions. Your treatment has been planned according to current medical practices, but problems sometimes occur. Call your health care provider if you have any problems or questions. What can I expect after the procedure? After vaginal delivery, it is common to have:  Some bleeding from your vagina.  Soreness in your abdomen, your vagina, and the area of skin between your vaginal opening and your anus (perineum).  Pelvic cramps.  Fatigue.  Follow these instructions at home: Medicines  Take over-the-counter and prescription medicines only as told by your health care provider.  If you were prescribed an antibiotic medicine, take it as told by your health care provider. Do not stop taking the antibiotic until it is finished. Driving   Do not drive or operate heavy machinery while taking prescription pain medicine.  Do not drive for 24 hours if you received a sedative. Lifestyle  Do not drink alcohol. This is especially important if you are breastfeeding or taking medicine to relieve pain.  Do not use tobacco products, including cigarettes, chewing tobacco, or e-cigarettes. If you need help quitting, ask your health care provider. Eating and drinking  Drink at least 8 eight-ounce glasses of water every day unless you are told not to by your health care provider. If you choose to breastfeed your baby, you may need to drink more water than this.  Eat high-fiber foods every day. These foods may help prevent or relieve constipation. High-fiber foods include: ? Whole grain cereals and breads. ? Brown rice. ? Beans. ? Fresh fruits and vegetables. Activity  Return to your normal activities as told by your health care provider. Ask your health care provider what activities are safe for you.  Rest as much as possible.  Try to rest or take a nap when your baby is sleeping.  Do not lift anything that is heavier than your baby or 10 lb (4.5 kg) until your health care provider says that it is safe.  Talk with your health care provider about when you can engage in sexual activity. This may depend on your: ? Risk of infection. ? Rate of healing. ? Comfort and desire to engage in sexual activity. Vaginal Care  If you have an episiotomy or a vaginal tear, check the area every day for signs of infection. Check for: ? More redness, swelling, or pain. ? More fluid or blood. ? Warmth. ? Pus or a bad smell.  Do not use tampons or douches until your health care provider says this is safe.  Watch for any blood clots that may pass from your vagina. These may look like clumps of dark red, brown, or black discharge. General instructions  Keep your perineum clean and dry as told by your  health care provider.  Wear loose, comfortable clothing.  Wipe from front to back when you use the toilet.  Ask your health care provider if you can shower or take a bath. If you had an episiotomy or a perineal tear during labor and delivery, your health care provider may tell you not to take baths for a certain length of time.  Wear a bra that supports your breasts and fits you well.  If possible, have someone help you with household activities and help care for your baby for at least a few days after you leave the hospital.  Keep all follow-up visits for you and your baby as told by your health care provider. This is important. Contact a health care provider if:  You have: ? Vaginal discharge that has a bad smell. ? Difficulty urinating. ? Pain when urinating. ? A sudden increase or decrease in the frequency of your bowel movements. ? More redness, swelling, or pain around your episiotomy or vaginal tear. ? More fluid or blood coming from your episiotomy or vaginal tear. ? Pus or a bad smell coming from your episiotomy or  vaginal tear. ? A fever. ? A rash. ? Little or no interest in activities you used to enjoy. ? Questions about caring for yourself or your baby.  Your episiotomy or vaginal tear feels warm to the touch.  Your episiotomy or vaginal tear is separating or does not appear to be healing.  Your breasts are painful, hard, or turn red.  You feel unusually sad or worried.  You feel nauseous or you vomit.  You pass large blood clots from your vagina. If you pass a blood clot from your vagina, save it to show to your health care provider. Do not flush blood clots down the toilet without having your health care provider look at them.  You urinate more than usual.  You are dizzy or light-headed.  You have not breastfed at all and you have not had a menstrual period for 12 weeks after delivery.  You have stopped breastfeeding and you have not had a menstrual period for 12 weeks after you stopped breastfeeding. Get help right away if:  You have: ? Pain that does not go away or does not get better with medicine. ? Chest pain. ? Difficulty breathing. ? Blurred vision or spots in your vision. ? Thoughts about hurting yourself or your baby.  You develop pain in your abdomen or in one of your legs.  You develop a severe headache.  You faint.  You bleed from your vagina so much that you fill two sanitary pads in one hour. This information is not intended to replace advice given to you by your health care provider. Make sure you discuss any questions you have with your health care provider. Document Released: 01/13/2000 Document Revised: 06/29/2015 Document Reviewed: 01/30/2015 Elsevier Interactive Patient Education  2017 [employer]

## 2016-06-29 NOTE — Discharge Summary (Signed)
OB Discharge Summary     Patient Name: Heather Savage DOB: 08/08/1990 MRN: [id number]  Date of admission: 06/27/2016 Delivering MD: Jen MowMUMAW, [facility]IZABETH [facility]   Date of discharge: 06/29/2016  Admitting diagnosis: 38 WEEKS ROM CTX Intrauterine pregnancy: 3670w0d     Secondary diagnosis:  Active Problems:   Normal labor   NSVD (normal spontaneous vaginal delivery)  Additional problems: None     Discharge diagnosis: Term Pregnancy Delivered                                                                                                Post partum procedures:Rhogam was not indicated, baby is also Rh NEG  Augmentation: Pitocin  Complications: None  Hospital course:  Onset of Labor With Vaginal Delivery     26 y.o. yo G2P1011 at 5970w0d was admitted in Active Labor on 06/27/2016. Patient had an uncomplicated labor course as follows:  Membrane Rupture Time/Date: 2:00 AM ,06/27/2016   Intrapartum Procedures: Episiotomy: None [1]                                         Lacerations:  2nd degree [3];Labial [10]  Patient had a delivery of a Viable infant. 06/27/2016  Information for the patient's newborn:  Elodia FlorenceGebremedhin, Boy Charolett [[id number]][[id number]]  Delivery Method: Vag-Spont    Pateint had an uncomplicated postpartum course.  She is ambulating, tolerating a regular diet, passing flatus, and urinating well. Patient is discharged home in stable condition on 06/29/16.   Physical exam  Vitals:   06/27/16 2300 06/28/16 0541 06/28/16 1817 06/29/16 0644  BP: (!) 114/57 106/60 (!) 115/58 (!) 95/52  Pulse: 98 96 72 79  Resp: 18 14 18 17   Temp: 98.8 F (37.1 C) 98.5 F (36.9 C) 98.3 F (36.8 C) 98.4 F (36.9 C)  TempSrc: Oral Oral Oral Oral  Weight:      Height:       General: alert, cooperative and no distress Lochia: appropriate Uterine Fundus: firm Incision: N/A DVT Evaluation: No evidence of DVT seen on physical exam. Negative Homan's sign. No cords or calf tenderness. Labs: Lab  Results  Component Value Date   WBC 6.9 06/27/2016   HGB 10.7 (L) 06/27/2016   HCT 31.0 (L) 06/27/2016   MCV 83.6 06/27/2016   PLT 199 06/27/2016   CMP Latest Ref Rng & Units 03/28/2016  Glucose 65 - 99 mg/dL -  BUN 6 - 20 mg/dL -  Creatinine 7.820.44 - 9.561.00 mg/dL -  Sodium 213135 - 086145 mmol/L -  Potassium 3.5 - 5.1 mmol/L -  Chloride 101 - 111 mmol/L -  CO2 22 - 32 mmol/L -  Calcium 8.9 - 10.3 mg/dL -  Total Protein 6.0 - 8.5 g/dL 6.3  Total Bilirubin 0.0 - 1.2 mg/dL <5.7<0.2  Alkaline Phos 39 - 117 IU/L 70  AST 0 - 40 IU/L 26  ALT 0 - 32 IU/L 24    Discharge instruction: per After Visit Summary and "Baby and Me Booklet".  After  visit meds:  Allergies as of 06/29/2016   No Known Allergies     Medication List    STOP taking these medications   cyclobenzaprine 10 MG tablet Commonly known as:  FLEXERIL   terconazole 0.8 % vaginal cream Commonly known as:  TERAZOL 3     TAKE these medications   docusate sodium 100 MG capsule Commonly known as:  COLACE Take 1 capsule (100 mg total) by mouth 2 (two) times daily.   ibuprofen 600 MG tablet Commonly known as:  ADVIL,MOTRIN Take 1 tablet (600 mg total) by mouth every 6 (six) hours.   oxyCODONE-acetaminophen 5-325 MG tablet Commonly known as:  ROXICET Take 1 tablet by mouth every 4 (four) hours as needed for severe pain.   Prenatal Vitamins 28-0.8 MG Tabs Take 1 tablet by mouth daily.       Diet: routine diet  Activity: Advance as tolerated. Pelvic rest for 6 weeks.   Outpatient follow up:6 weeks Follow up Appt:No future appointments. Follow up Visit:No Follow-up on file.  Postpartum contraception: Discussed, but unsure, sent home with information  Newborn Data: Live born female  Birth Weight: 7 lb 7.8 oz (3396 g) APGAR: 9, 9  Baby Feeding: Bottle Disposition:home with mother   06/29/2016 Jen Mow, DO OB fellow

## 2016-06-30 LAB — URINE CULTURE: CULTURE: NO GROWTH

## 2016-07-04 ENCOUNTER — Telehealth: Payer: Self-pay

## 2016-07-04 NOTE — Telephone Encounter (Addendum)
Patient called, she is having problems with back pain, and constipation. Patient advised to increase fluid intake, take Miralax. Also to make PP Appt.

## 2016-07-05 ENCOUNTER — Encounter: Payer: Self-pay | Admitting: Certified Nurse Midwife

## 2016-07-12 ENCOUNTER — Encounter: Payer: Self-pay | Admitting: Certified Nurse Midwife

## 2016-08-07 ENCOUNTER — Ambulatory Visit: Payer: Self-pay | Admitting: Certified Nurse Midwife

## 2016-09-14 ENCOUNTER — Emergency Department (HOSPITAL_COMMUNITY)
Admission: EM | Admit: 2016-09-14 | Discharge: 2016-09-15 | Disposition: A | Discharge status: Home / Self Care | Payer: Medicaid Other | Attending: Emergency Medicine | Admitting: Emergency Medicine

## 2016-09-14 ENCOUNTER — Encounter (HOSPITAL_COMMUNITY): Payer: Self-pay | Admitting: *Deleted

## 2016-09-14 DIAGNOSIS — N644 Mastodynia: Secondary | ICD-10-CM | POA: Diagnosis present

## 2016-09-14 DIAGNOSIS — N61 Mastitis without abscess: Secondary | ICD-10-CM | POA: Diagnosis not present

## 2016-09-14 MED ORDER — CEPHALEXIN 500 MG PO CAPS: 500.0000 mg | ORAL_CAPSULE | Freq: Four times a day (QID) | ORAL | 0 refills | Status: DC

## 2016-09-14 NOTE — ED Provider Notes (Signed)
[facility]-EMERGENCY DEPT Provider Note   CSN: [id number] Arrival date & time: 09/14/16  1012     History   Chief Complaint Chief Complaint  Patient presents with  . Breast Pain  . Back Pain    HPI Heather Savage is a 26 y.o. female.  Patient is a 26 year old female who is 2 months postpartum from having her first child. She presents for evaluation of swelling, redness, and pain to both breasts. She is currently nursing. She denies any fevers or chills. She states the pain radiates to the tops of her shoulders and into her back and is worse when she is nursing and holding her child. Denies any chest pain or shortness of breath. She denies any fevers or chills.   The history is provided by the patient.    Past Medical History:  Diagnosis Date  . Anemia    states has low blood count, no current meds.  Marland Kitchen Hearing loss    right  . History of gastritis    no current med.  Marland Kitchen History of malaria 2012  . Rh negative, maternal   . Tympanic membrane perforation 05/2012   right    Patient Active Problem List   Diagnosis Date Noted  . NSVD (normal spontaneous vaginal delivery) 06/29/2016  . Normal labor 06/27/2016  . Large for dates 06/14/2016  . Abnormal TSH 04/19/2016  . Rh negative state in antepartum period 02/08/2016  . Supervision of normal pregnancy, antepartum 01/02/2016  . Language barrier 01/02/2016  . Hearing loss 05/01/2011    Past Surgical History:  Procedure Laterality Date  . SCAR REVISION  08/27/2011   Procedure: SCAR REVISION;  Surgeon: Louisa Second, MD;  Location: [facility];  Service: Plastics;  Laterality: N/A;  scar revision of forehead  . TYMPANOPLASTY  05/14/2011   Procedure: TYMPANOPLASTY;  Surgeon: Serena Colonel, MD;  Location: [facility];  Service: ENT;  Laterality: Left;  TYMPANOPLASYT AND  POSSIBLE OSSICULARPLASTY   . TYMPANOPLASTY  10/10/2011   Procedure: TYMPANOPLASTY;  Surgeon: Serena Colonel, MD;  Location: [facility];   Service: ENT;  Laterality: Right;  . TYMPANOPLASTY Right 06/30/2012   Procedure: REVISION RIGHT TYMPANOPLASTY;  Surgeon: Serena Colonel, MD;  Location: [facility];  Service: ENT;  Laterality: Right;    OB History    Gravida Para Term Preterm AB Living   2 1 1  0 1 1   SAB TAB Ectopic Multiple Live Births   1 0 0 0 1       Home Medications    Prior to Admission medications   Medication Sig Start Date End Date Taking? Authorizing Provider  docusate sodium (COLACE) 100 MG capsule Take 1 capsule (100 mg total) by mouth 2 (two) times daily. 06/29/16   Mumaw, Hiram Comber, DO  ibuprofen (ADVIL,MOTRIN) 600 MG tablet Take 1 tablet (600 mg total) by mouth every 6 (six) hours. 06/29/16   Mumaw, Hiram Comber, DO  oxyCODONE-acetaminophen (ROXICET) 5-325 MG tablet Take 1 tablet by mouth every 4 (four) hours as needed for severe pain. 06/29/16   Mumaw, Hiram Comber, DO  Prenatal Vit-Fe Fumarate-FA (PRENATAL VITAMINS) 28-0.8 MG TABS Take 1 tablet by mouth daily. 03/15/16   Brock Bad, MD    Family History No family history on file.  Social History Social History  Substance Use Topics  . Smoking status: Never Smoker  . Smokeless tobacco: Never Used  . Alcohol use No     Allergies   Patient has  no known allergies.   Review of Systems Review of Systems  All other systems reviewed and are negative.    Physical Exam Updated Vital Signs BP 113/73 (BP Location: Right Arm)   Pulse 63   Temp 98.1 F (36.7 C) (Oral)   Resp 16   SpO2 100%   Breastfeeding? Yes   Physical Exam  Constitutional: She is oriented to person, place, and time. She appears well-developed and well-nourished. No distress.  HENT:  Head: Normocephalic and atraumatic.  Neck: Normal range of motion. Neck supple.  Cardiovascular: Normal rate and regular rhythm.  Exam reveals no gallop and no friction rub.   No murmur heard. Pulmonary/Chest: Effort normal and breath sounds normal. No  respiratory distress. She has no wheezes.  Abdominal: Soft. Bowel sounds are normal. She exhibits no distension. There is no tenderness.  Genitourinary:  Genitourinary Comments: There is erythema and tenderness to the breast tissue surrounding both nipples. There is some subcutaneous nodularity present. There is no discharge.  Musculoskeletal: Normal range of motion.  Neurological: She is alert and oriented to person, place, and time.  Skin: Skin is warm and dry. She is not diaphoretic.  Nursing note and vitals reviewed.    ED Treatments / Results  Labs (all labs ordered are listed, but only abnormal results are displayed) EKG  EKG Interpretation None       Radiology No results found.  Procedures Procedures (including critical care time)  Medications Ordered in ED Medications - No data to display   Initial Impression / Assessment and Plan / ED Course  I have reviewed the triage vital signs and the nursing notes.  Pertinent labs & imaging results that were available during my care of the patient were reviewed by me and considered in my medical decision making (see chart for details).  This appears to be bilateral mastitis. This will be treated with Keflex and warm soaks. She is to follow-up with her OB/GYN in the next 3-4 days if not improving.  Final Clinical Impressions(s) / ED Diagnoses   Final diagnoses:  None    New Prescriptions New Prescriptions   No medications on file     Geoffery Lyons, MD 09/14/16 2338

## 2016-09-14 NOTE — Discharge Instructions (Signed)
Keflex as prescribed.  Tylenol 1000 milligrams every 6 hours as needed for pain.  Follow-up with OB/GYN if you're not improving in the next 3-4 days.

## 2016-09-14 NOTE — ED Triage Notes (Signed)
Pt post partum 2 months with pain to both breasts and into back and states when she is feeding the baby she has pain in her back

## 2016-09-18 ENCOUNTER — Encounter: Payer: Self-pay | Admitting: Certified Nurse Midwife

## 2016-09-18 ENCOUNTER — Ambulatory Visit (INDEPENDENT_AMBULATORY_CARE_PROVIDER_SITE_OTHER): Payer: Medicaid Other | Admitting: Certified Nurse Midwife

## 2016-09-18 VITALS — BP 118/69 | HR 58 | Ht 63.0 in | Wt 139.1 lb

## 2016-09-18 DIAGNOSIS — O9103 Infection of nipple associated with lactation: Secondary | ICD-10-CM

## 2016-09-18 DIAGNOSIS — N61 Mastitis without abscess: Secondary | ICD-10-CM

## 2016-09-18 MED ORDER — PRENATE PIXIE 10-0.6-0.4-200 MG PO CAPS: 1.0000 | ORAL_CAPSULE | Freq: Every day | ORAL | 12 refills | Status: DC

## 2016-09-18 MED ORDER — OXYCODONE-ACETAMINOPHEN 5-325 MG PO TABS: 1.0000 | ORAL_TABLET | ORAL | 0 refills | Status: DC | PRN

## 2016-09-18 MED ORDER — AMOXICILLIN-POT CLAVULANATE 875-125 MG PO TABS: 1.0000 | ORAL_TABLET | Freq: Two times a day (BID) | ORAL | 0 refills | Status: DC

## 2016-09-18 MED ORDER — IBUPROFEN 800 MG PO TABS: 800.0000 mg | ORAL_TABLET | Freq: Three times a day (TID) | ORAL | 1 refills | Status: DC | PRN

## 2016-09-18 NOTE — Progress Notes (Signed)
Pt complains of having breast pain in both breast. Sx has been present since delivery. She has not noticed any discharge or bleeding.

## 2016-09-18 NOTE — Progress Notes (Signed)
Patient ID: MITA VALLO, female   DOB: 16-Jan-1991, 26 y.o.   MRN: [id number]  Chief Complaint  Patient presents with  . Breast Pain    HPI MEIRA WAHBA is a 26 y.o. female.  Here with worsening mastitis.  Had been taking Keflex, last reported fever last night.  States that she is breast feeding almost every feed, supplements maybe one time a day with similac formula.  Encouraged to exclusively breast feed and massage breasts while breast feeding.  Was seen at [facility] ED on 09/14/16 and given Keflex, patient states that her pain is worse since then and breasts are tender to touch.  Educated on hand expression and correct latch.  Information for mastitis given to spouse.      HPI  Past Medical History:  Diagnosis Date  . Anemia    states has low blood count, no current meds.  Marland Kitchen Hearing loss    right  . History of gastritis    no current med.  Marland Kitchen History of malaria 2012  . Rh negative, maternal   . Tympanic membrane perforation 05/2012   right    Past Surgical History:  Procedure Laterality Date  . SCAR REVISION  08/27/2011   Procedure: SCAR REVISION;  Surgeon: Louisa Second, MD;  Location: [facility];  Service: Plastics;  Laterality: N/A;  scar revision of forehead  . TYMPANOPLASTY  05/14/2011   Procedure: TYMPANOPLASTY;  Surgeon: Serena Colonel, MD;  Location: [facility];  Service: ENT;  Laterality: Left;  TYMPANOPLASYT AND  POSSIBLE OSSICULARPLASTY   . TYMPANOPLASTY  10/10/2011   Procedure: TYMPANOPLASTY;  Surgeon: Serena Colonel, MD;  Location: [facility];  Service: ENT;  Laterality: Right;  . TYMPANOPLASTY Right 06/30/2012   Procedure: REVISION RIGHT TYMPANOPLASTY;  Surgeon: Serena Colonel, MD;  Location: [facility];  Service: ENT;  Laterality: Right;    History reviewed. No pertinent family history.  Social History Social History  Substance Use Topics  . Smoking status: Never Smoker  . Smokeless tobacco: Never Used  . Alcohol  use No    No Known Allergies  Current Outpatient Prescriptions  Medication Sig Dispense Refill  . amoxicillin-clavulanate (AUGMENTIN) 875-125 MG tablet Take 1 tablet by mouth 2 (two) times daily. 28 tablet 0  . cephALEXin (KEFLEX) 500 MG capsule Take 1 capsule (500 mg total) by mouth 4 (four) times daily. 28 capsule 0  . docusate sodium (COLACE) 100 MG capsule Take 1 capsule (100 mg total) by mouth 2 (two) times daily. (Patient not taking: Reported on 09/14/2016) 30 capsule 0  . ibuprofen (ADVIL,MOTRIN) 800 MG tablet Take 1 tablet (800 mg total) by mouth every 8 (eight) hours as needed. 60 tablet 1  . oxyCODONE-acetaminophen (ROXICET) 5-325 MG tablet Take 1 tablet by mouth every 4 (four) hours as needed for severe pain. 30 tablet 0  . Prenat-FeAsp-Meth-FA-DHA w/o A (PRENATE PIXIE) 10-0.6-0.4-200 MG CAPS Take 1 tablet by mouth daily. 30 capsule 12  . Prenatal Vit-Fe Fumarate-FA (PRENATAL VITAMINS) 28-0.8 MG TABS Take 1 tablet by mouth daily. (Patient not taking: Reported on 09/14/2016) 30 tablet 10   No current facility-administered medications for this visit.     Review of Systems Review of Systems Constitutional: negative for fatigue and weight loss Respiratory: negative for cough and wheezing Cardiovascular: negative for chest pain, fatigue and palpitations Gastrointestinal: negative for abdominal pain and change in bowel habits Genitourinary:negative Integument/breast: negative for nipple discharge Musculoskeletal:negative for myalgias Neurological: negative for gait problems and  tremors Behavioral/Psych: negative for abusive relationship, depression Endocrine: negative for temperature intolerance      Blood pressure 118/69, pulse (!) 58, height 5\' 3"  (1.6 m), weight 139 lb 1.6 oz (63.1 kg), currently breastfeeding.  Physical Exam Physical Exam General:   alert  Skin:   no rash or abnormalities  Lungs:   clear to auscultation bilaterally  Heart:   regular rate and rhythm, S1,  S2 normal, no murmur, click, rub or gallop  Breasts:   bilateral edema with erythema present from nipples to lower breast boarder tissue, left side ?abcess around 9 o'clock region  Abdomen:  normal findings: no organomegaly, soft, non-tender and no hernia  Pelvis:  Deferred    50% of 20 min visit spent on counseling and coordination of care.    Data Reviewed Previous medical hx, meds  Assessment      Acute mastitis r/o abscess of left breast    Plan    Orders Placed This Encounter  Procedures  . US BREAST LTD UNI RIGHT INC AXILLA    Medicaid Pf: none Co-sign sent 09/18/16 No needs [facility]    Standing Status:   Future    Standing Expiration Date:   11/18/2017    Order Specific Question:   Reason for Exam (SYMPTOM  OR DIAGNOSIS REQUIRED)    Answer:   mastitis    Order Specific Question:   Preferred imaging location?    Answer:   [facility]  . US BREAST LTD UNI LEFT INC AXILLA    Medicaid Pf: none Co-sign sent 09/18/16 No needs [facility]    Standing Status:   Future    Standing Expiration Date:   11/18/2017    Order Specific Question:   Reason for Exam (SYMPTOM  OR DIAGNOSIS REQUIRED)    Answer:   worsening mastitis    Order Specific Question:   Preferred imaging location?    Answer:   [facility]   Meds ordered this encounter  Medications  . ibuprofen (ADVIL,MOTRIN) 800 MG tablet    Sig: Take 1 tablet (800 mg total) by mouth every 8 (eight) hours as needed.    Dispense:  60 tablet    Refill:  1  . oxyCODONE-acetaminophen (ROXICET) 5-325 MG tablet    Sig: Take 1 tablet by mouth every 4 (four) hours as needed for severe pain.    Dispense:  30 tablet    Refill:  0  . Prenat-FeAsp-Meth-FA-DHA w/o A (PRENATE PIXIE) 10-0.6-0.4-200 MG CAPS    Sig: Take 1 tablet by mouth daily.    Dispense:  30 capsule    Refill:  12    Please process coupon: Rx BIN: [id number], RxPCN: [id number], RxGRP: [id number], RxID: [id number]  SUF: 01  .  amoxicillin-clavulanate (AUGMENTIN) 875-125 MG tablet    Sig: Take 1 tablet by mouth 2 (two) times daily.    Dispense:  28 tablet    Refill:  0     Follow up as needed if symptoms are not improving with 2 antibiotics and pain medications.

## 2016-09-25 ENCOUNTER — Ambulatory Visit (INDEPENDENT_AMBULATORY_CARE_PROVIDER_SITE_OTHER): Payer: Medicaid Other | Admitting: Certified Nurse Midwife

## 2016-09-25 ENCOUNTER — Encounter: Payer: Self-pay | Admitting: Certified Nurse Midwife

## 2016-09-25 VITALS — BP 113/74 | HR 87 | Wt 141.2 lb

## 2016-09-25 DIAGNOSIS — N61 Mastitis without abscess: Secondary | ICD-10-CM | POA: Diagnosis not present

## 2016-09-25 NOTE — Progress Notes (Signed)
Patient is here 13 weeks after delivery. She still has breast pain.

## 2016-09-26 NOTE — Progress Notes (Signed)
Subjective:     Heather Savage is a 26 y.o. female who presents for a postpartum visit/follow up after mastitis. She is several weeks postpartum following a spontaneous vaginal delivery. I have fully reviewed the prenatal and intrapartum course. The delivery was at 38 gestational weeks. Outcome: spontaneous vaginal delivery. Anesthesia: epidural. Postpartum course has been UNREMARKABLE. Baby's course has been UNREMARKABLE. Baby is feeding by breast. Bleeding no bleeding. Bowel function is normal. Bladder function is normal. Patient is not sexually active. Contraception method is none. Postpartum depression screening: negative.  States that she is improving with antibiotics for the mastitis.  No fevers.  Is exclusively breast feeding.   The following portions of the patient's history were reviewed and updated as appropriate: allergies, current medications, past family history, past medical history, past social history, past surgical history and problem list.  Review of Systems Pertinent items noted in HPI and remainder of comprehensive ROS otherwise negative.   Objective:    BP 113/74   Pulse 87   Wt 141 lb 3.2 oz (64 kg)   LMP  (LMP Unknown)   Breastfeeding? Yes   BMI 25.01 kg/m   General:  alert, cooperative and no distress   Breasts:  inspection negative, no nipple discharge or bleeding, no masses or nodularity palpable.  Slightly red areas noted bilaterally on both breasts.Slightly tender to touch.   Lungs: clear to auscultation bilaterally  Heart:  regular rate and rhythm, S1, S2 normal, no murmur, click, rub or gallop  Abdomen: soft, non-tender; bowel sounds normal; no masses,  no organomegaly  Pelvic Exam: Not performed.        Assessment:    Mastitis resolving with antibiotics, has US at breast center scheduled.  Pap smear not done at today's visit, due January 03, 2017.    Plan:    1. Contraception: abstinence 2. Is thinking about POP for birth control.  3. Follow up  in: 4 months or as needed for birth control.

## 2016-10-03 ENCOUNTER — Ambulatory Visit
Admission: RE | Admit: 2016-10-03 | Discharge: 2016-10-03 | Disposition: A | Discharge status: Home / Self Care | Payer: Medicaid Other | Source: Ambulatory Visit | Attending: Certified Nurse Midwife | Admitting: Certified Nurse Midwife

## 2016-10-03 DIAGNOSIS — N61 Mastitis without abscess: Secondary | ICD-10-CM

## 2017-04-22 ENCOUNTER — Encounter: Payer: Self-pay | Admitting: *Deleted

## 2017-10-19 ENCOUNTER — Encounter (HOSPITAL_COMMUNITY): Payer: Self-pay | Admitting: Emergency Medicine

## 2017-10-19 ENCOUNTER — Other Ambulatory Visit: Payer: Self-pay

## 2017-10-19 ENCOUNTER — Ambulatory Visit (HOSPITAL_COMMUNITY)
Admission: EM | Admit: 2017-10-19 | Discharge: 2017-10-19 | Disposition: A | Discharge status: Home / Self Care | Payer: Self-pay | Attending: Family Medicine | Admitting: Family Medicine

## 2017-10-19 DIAGNOSIS — R1010 Upper abdominal pain, unspecified: Secondary | ICD-10-CM

## 2017-10-19 DIAGNOSIS — Z3202 Encounter for pregnancy test, result negative: Secondary | ICD-10-CM

## 2017-10-19 DIAGNOSIS — M791 Myalgia, unspecified site: Secondary | ICD-10-CM

## 2017-10-19 DIAGNOSIS — R110 Nausea: Secondary | ICD-10-CM

## 2017-10-19 DIAGNOSIS — R519 Headache, unspecified: Secondary | ICD-10-CM

## 2017-10-19 DIAGNOSIS — R51 Headache: Secondary | ICD-10-CM

## 2017-10-19 LAB — POCT URINALYSIS DIP (DEVICE)
BILIRUBIN URINE: NEGATIVE
Glucose, UA: NEGATIVE mg/dL
Ketones, ur: NEGATIVE mg/dL
LEUKOCYTES UA: NEGATIVE
Nitrite: NEGATIVE
Protein, ur: NEGATIVE mg/dL
SPECIFIC GRAVITY, URINE: 1.025 (ref 1.005–1.030)
UROBILINOGEN UA: 0.2 mg/dL (ref 0.0–1.0)
pH: 6 (ref 5.0–8.0)

## 2017-10-19 LAB — POCT PREGNANCY, URINE: Preg Test, Ur: NEGATIVE

## 2017-10-19 MED ORDER — KETOROLAC TROMETHAMINE 30 MG/ML IJ SOLN
30.0000 mg | Freq: Once | INTRAMUSCULAR | Status: AC
  Administered 2017-10-19: 30 mg via INTRAVENOUS

## 2017-10-19 MED ORDER — SODIUM CHLORIDE 0.9 % IV BOLUS
1000.0000 mL | Freq: Once | INTRAVENOUS | Status: AC
  Administered 2017-10-19: 1000 mL via INTRAVENOUS

## 2017-10-19 MED ORDER — IBUPROFEN 800 MG PO TABS: 800.0000 mg | ORAL_TABLET | Freq: Three times a day (TID) | ORAL | 0 refills | Status: DC

## 2017-10-19 MED ORDER — ONDANSETRON HCL 4 MG PO TABS: 4.0000 mg | ORAL_TABLET | Freq: Three times a day (TID) | ORAL | 0 refills | Status: DC | PRN

## 2017-10-19 MED ORDER — ONDANSETRON HCL 4 MG/2ML IJ SOLN
4.0000 mg | Freq: Once | INTRAMUSCULAR | Status: AC
  Administered 2017-10-19: 4 mg via INTRAVENOUS

## 2017-10-19 MED ORDER — ONDANSETRON HCL 4 MG/2ML IJ SOLN
INTRAMUSCULAR | Status: AC
  Filled 2017-10-19: qty 2

## 2017-10-19 MED ORDER — KETOROLAC TROMETHAMINE 30 MG/ML IJ SOLN
INTRAMUSCULAR | Status: AC
  Filled 2017-10-19: qty 1

## 2017-10-19 NOTE — Discharge Instructions (Signed)
Small frequent sips of fluids- Pedialyte, Gatorade, water, broth- to maintain hydration.   Zofran as needed for nausea.  Advance to bland diet as tolerated. Ibuprofen as needed for headache, take only if have eaten as may cause further stomach upset.  If you develop worsening of pain, fevers, headache, neck rigidity, vomiting or blood in stool please return to be seen or go to the Er.

## 2017-10-19 NOTE — ED Triage Notes (Signed)
Patient has a headache and nauseated, no vomiting.  Patient has abdominal pain.  Patient has generalized body aches.  Onset of symptoms 3-4 days ago.  Denies burning with urination

## 2017-10-19 NOTE — ED Provider Notes (Signed)
[facility]    CSN: [id number] Arrival date & time: 10/19/17  1137     History   Chief Complaint Chief Complaint  Patient presents with  . Abdominal Pain    HPI Heather Savage is a 27 y.o. female.   [name] presents with family with complaints of headache, body aches, intermittent abdominal pain, nausea which started 3-4 days ago, worse last night. Pain 8/10. Hasn't taken anything for pain. Yesterday took a pain medication which did help. No urinary symptoms. She is on her period, it has been regular. Has not vomited no diarrhea. Last BM yesterday and was normal. Dizzy at times. Hasn't eaten or drank anything today. Denies any previous similar. No recent travel. No known ill contacts. Family provides intermittent interpretation as needed per patient request.    ROS per HPI.      History reviewed. No pertinent past medical history.  There are no active problems to display for this patient.   Past Surgical History:  Procedure Laterality Date  . EXTERNAL EAR SURGERY      OB History   None      Home Medications    Prior to Admission medications   Medication Sig Start Date End Date Taking? Authorizing Provider  ibuprofen (ADVIL,MOTRIN) 800 MG tablet Take 1 tablet (800 mg total) by mouth 3 (three) times daily. 10/19/17   Georgetta Haber, NP  ondansetron (ZOFRAN) 4 MG tablet Take 1 tablet (4 mg total) by mouth every 8 (eight) hours as needed for nausea or vomiting. 10/19/17   Georgetta Haber, NP    Family History History reviewed. No pertinent family history.  Social History Social History   Tobacco Use  . Smoking status: Never Smoker  Substance Use Topics  . Alcohol use: Never    Frequency: Never  . Drug use: Never     Allergies   Patient has no known allergies.   Review of Systems Review of Systems   Physical Exam Triage Vital Signs ED Triage Vitals  Enc Vitals Group     BP 10/19/17 1243 106/70     Pulse Rate 10/19/17 1243 83     Resp 10/19/17 1243 20     Temp 10/19/17 1243 98.1 F (36.7 C)     Temp Source 10/19/17 1243 Oral     SpO2 10/19/17 1243 100 %     Weight --      Height --      Head Circumference --      Peak Flow --      Pain Score 10/19/17 1239 8     Pain Loc --      Pain Edu? --      Excl. in GC? --    No data found.  Updated Vital Signs BP 106/70 (BP Location: Right Arm)   Pulse 83   Temp 98.1 F (36.7 C) (Oral)   Resp 20   LMP 10/19/2017   SpO2 100%    Physical Exam  Constitutional: She is oriented to person, place, and time. She appears well-developed and well-nourished. No distress.  HENT:  Head: Normocephalic and atraumatic.  Neck: No neck rigidity.  Cardiovascular: Normal rate, regular rhythm and normal heart sounds.  Pulmonary/Chest: Effort normal and breath sounds normal.  Abdominal: Soft. Bowel sounds are normal. There is tenderness in the suprapubic area. There is no rigidity, no rebound, no guarding and no CVA tenderness.  Very mild suprapubic tenderness on deep palpation; no rebound, rigidity or guarding   Neurological:  She is alert and oriented to person, place, and time.  Skin: Skin is warm and dry.     UC Treatments / Results  Labs (all labs ordered are listed, but only abnormal results are displayed) Labs Reviewed  POCT URINALYSIS DIP (DEVICE) - Abnormal; Notable for the following components:      Result Value   Hgb urine dipstick LARGE (*)    All other components within normal limits  POCT PREGNANCY, URINE    EKG None  Radiology No results found.  Procedures Procedures (including critical care time)  Medications Ordered in UC Medications  sodium chloride 0.9 % bolus 1,000 mL (1,000 mLs Intravenous New Bag/Given 10/19/17 1337)  ketorolac (TORADOL) 30 MG/ML injection 30 mg (30 mg Intravenous Given 10/19/17 1337)  ondansetron (ZOFRAN) injection 4 mg (4 mg Intravenous Given 10/19/17 1337)    Initial Impression / Assessment and Plan / UC Course  I have  reviewed the triage vital signs and the nursing notes.  Pertinent labs & imaging results that were available during my care of the patient were reviewed by me and considered in my medical decision making (see chart for details).     Non specific findings on exam. Patient with limited intake due to nausea. Body aches and headache present. Afebrile. No tachycardia. BP remains stable here today. Fluids, toradol and zofran provided with improvement of symptoms. On period, ua otherwise negative. Will continue with supportive cares, question viral illness? Return precautions provided. Patient verbalized understanding and agreeable to plan.  Ambulatory out of clinic without difficulty.     1410: patient states headache and nausea have improved. Resting comfortably.  Final Clinical Impressions(s) / UC Diagnoses   Final diagnoses:  Nausea  Acute nonintractable headache, unspecified headache type     Discharge Instructions     Small frequent sips of fluids- Pedialyte, Gatorade, water, broth- to maintain hydration.   Zofran as needed for nausea.  Advance to bland diet as tolerated. Ibuprofen as needed for headache, take only if have eaten as may cause further stomach upset.  If you develop worsening of pain, fevers, headache, neck rigidity, vomiting or blood in stool please return to be seen or go to the Er.     ED Prescriptions    Medication Sig Dispense Auth. Provider   ondansetron (ZOFRAN) 4 MG tablet Take 1 tablet (4 mg total) by mouth every 8 (eight) hours as needed for nausea or vomiting. 10 tablet Linus MakoBurky, Natalie B, NP   ibuprofen (ADVIL,MOTRIN) 800 MG tablet Take 1 tablet (800 mg total) by mouth 3 (three) times daily. 21 tablet Georgetta HaberBurky, Natalie B, NP     Controlled Substance Prescriptions [place] Controlled Substance Registry consulted? Not Applicable   Georgetta HaberBurky, Natalie B, NP 10/19/17 1505

## 2017-11-08 ENCOUNTER — Encounter (HOSPITAL_COMMUNITY): Payer: Self-pay | Admitting: Emergency Medicine

## 2017-11-08 ENCOUNTER — Ambulatory Visit (HOSPITAL_COMMUNITY)
Admission: EM | Admit: 2017-11-08 | Discharge: 2017-11-08 | Disposition: A | Discharge status: Home / Self Care | Payer: Self-pay | Attending: Family Medicine | Admitting: Family Medicine

## 2017-11-08 DIAGNOSIS — D649 Anemia, unspecified: Secondary | ICD-10-CM | POA: Insufficient documentation

## 2017-11-08 DIAGNOSIS — H9191 Unspecified hearing loss, right ear: Secondary | ICD-10-CM | POA: Insufficient documentation

## 2017-11-08 DIAGNOSIS — N390 Urinary tract infection, site not specified: Secondary | ICD-10-CM | POA: Insufficient documentation

## 2017-11-08 LAB — POCT PREGNANCY, URINE: PREG TEST UR: NEGATIVE

## 2017-11-08 LAB — POCT URINALYSIS DIP (DEVICE)
Bilirubin Urine: NEGATIVE
GLUCOSE, UA: NEGATIVE mg/dL
KETONES UR: NEGATIVE mg/dL
Nitrite: POSITIVE — AB
PH: 5.5 (ref 5.0–8.0)
PROTEIN: NEGATIVE mg/dL
SPECIFIC GRAVITY, URINE: 1.025 (ref 1.005–1.030)
Urobilinogen, UA: 0.2 mg/dL (ref 0.0–1.0)

## 2017-11-08 MED ORDER — FLUCONAZOLE 150 MG PO TABS: 150.0000 mg | ORAL_TABLET | Freq: Once | ORAL | 1 refills | Status: AC

## 2017-11-08 MED ORDER — NITROFURANTOIN MONOHYD MACRO 100 MG PO CAPS: 100.0000 mg | ORAL_CAPSULE | Freq: Two times a day (BID) | ORAL | 0 refills | Status: DC

## 2017-11-08 NOTE — ED Triage Notes (Signed)
Pt sts UTI sx x 2 days  

## 2017-11-08 NOTE — ED Provider Notes (Signed)
[facility]    CSN: [id number] Arrival date & time: 11/08/17  1551     History   Chief Complaint Chief Complaint  Patient presents with  . Urinary Tract Infection    HPI Heather Savage is a 27 y.o. female.   Pt sts UTI sx x 2 days      Past Medical History:  Diagnosis Date  . Anemia    states has low blood count, no current meds.  Marland Kitchen Hearing loss    right  . History of gastritis    no current med.  Marland Kitchen History of malaria 2012  . Rh negative, maternal   . Tympanic membrane perforation 05/2012   right    Patient Active Problem List   Diagnosis Date Noted  . Language barrier 01/02/2016  . Hearing loss 05/01/2011    Past Surgical History:  Procedure Laterality Date  . EXTERNAL EAR SURGERY    . SCAR REVISION  08/27/2011   Procedure: SCAR REVISION;  Surgeon: Louisa Second, MD;  Location: [facility];  Service: Plastics;  Laterality: N/A;  scar revision of forehead  . TYMPANOPLASTY  05/14/2011   Procedure: TYMPANOPLASTY;  Surgeon: Serena Colonel, MD;  Location: [facility];  Service: ENT;  Laterality: Left;  TYMPANOPLASYT AND  POSSIBLE OSSICULARPLASTY   . TYMPANOPLASTY  10/10/2011   Procedure: TYMPANOPLASTY;  Surgeon: Serena Colonel, MD;  Location: [facility];  Service: ENT;  Laterality: Right;  . TYMPANOPLASTY Right 06/30/2012   Procedure: REVISION RIGHT TYMPANOPLASTY;  Surgeon: Serena Colonel, MD;  Location: [facility];  Service: ENT;  Laterality: Right;    OB History    Gravida  2   Para  1   Term  1   Preterm  0   AB  1   Living  1     SAB  1   TAB  0   Ectopic  0   Multiple      Live Births  1            Home Medications    Prior to Admission medications   Medication Sig Start Date End Date Taking? Authorizing Provider  fluconazole (DIFLUCAN) 150 MG tablet Take 1 tablet (150 mg total) by mouth once for 1 dose. Repeat if needed 11/08/17 11/08/17  Elvina Sidle, MD  nitrofurantoin,  macrocrystal-monohydrate, (MACROBID) 100 MG capsule Take 1 capsule (100 mg total) by mouth 2 (two) times daily. 11/08/17   Elvina Sidle, MD    Family History History reviewed. No pertinent family history.  Social History Social History   Tobacco Use  . Smoking status: Never Smoker  Substance Use Topics  . Alcohol use: Never    Frequency: Never  . Drug use: Never     Allergies   Patient has no known allergies.   Review of Systems Review of Systems   Physical Exam Triage Vital Signs ED Triage Vitals [11/08/17 1644]  Enc Vitals Group     BP 115/78     Pulse Rate 66     Resp 18     Temp (!) 97.3 F (36.3 C)     Temp Source Oral     SpO2 100 %     Weight      Height      Head Circumference      Peak Flow      Pain Score 4     Pain Loc      Pain Edu?  Excl. in GC?    No data found.  Updated Vital Signs BP 115/78 (BP Location: Left Arm)   Pulse 66   Temp (!) 97.3 F (36.3 C) (Oral)   Resp 18   LMP 10/19/2017   SpO2 100%    Physical Exam  Constitutional: She is oriented to person, place, and time. She appears well-developed and well-nourished.  HENT:  Right Ear: External ear normal.  Left Ear: External ear normal.  Eyes: Pupils are equal, round, and reactive to light. Conjunctivae are normal.  Neck: Normal range of motion. Neck supple.  Pulmonary/Chest: Effort normal.  Musculoskeletal: Normal range of motion.  Neurological: She is alert and oriented to person, place, and time.  Skin: Skin is warm and dry.  Nursing note and vitals reviewed.    UC Treatments / Results  Labs (all labs ordered are listed, but only abnormal results are displayed) Labs Reviewed  POCT URINALYSIS DIP (DEVICE) - Abnormal; Notable for the following components:      Result Value   Hgb urine dipstick MODERATE (*)    Nitrite POSITIVE (*)    Leukocytes, UA SMALL (*)    All other components within normal limits  URINE CULTURE  POCT PREGNANCY, URINE     EKG None  Radiology No results found.  Procedures Procedures (including critical care time)  Medications Ordered in UC Medications - No data to display  Initial Impression / Assessment and Plan / UC Course  I have reviewed the triage vital signs and the nursing notes.  Pertinent labs & imaging results that were available during my care of the patient were reviewed by me and considered in my medical decision making (see chart for details).     Final Clinical Impressions(s) / UC Diagnoses   Final diagnoses:  Lower urinary tract infectious disease   Discharge Instructions   None    ED Prescriptions    Medication Sig Dispense Auth. Provider   nitrofurantoin, macrocrystal-monohydrate, (MACROBID) 100 MG capsule Take 1 capsule (100 mg total) by mouth 2 (two) times daily. 20 capsule Elvina Sidle, MD   fluconazole (DIFLUCAN) 150 MG tablet Take 1 tablet (150 mg total) by mouth once for 1 dose. Repeat if needed 2 tablet Elvina Sidle, MD     Controlled Substance Prescriptions [place] Controlled Substance Registry consulted? Not Applicable   Elvina Sidle, MD 11/08/17 1740

## 2017-11-10 LAB — URINE CULTURE: Culture: >=100000 — AB

## 2017-11-18 ENCOUNTER — Ambulatory Visit (HOSPITAL_COMMUNITY)
Admission: EM | Admit: 2017-11-18 | Discharge: 2017-11-18 | Disposition: A | Discharge status: Home / Self Care | Payer: Self-pay | Attending: Family Medicine | Admitting: Family Medicine

## 2017-11-18 ENCOUNTER — Encounter (HOSPITAL_COMMUNITY): Payer: Self-pay | Admitting: Emergency Medicine

## 2017-11-18 ENCOUNTER — Ambulatory Visit (INDEPENDENT_AMBULATORY_CARE_PROVIDER_SITE_OTHER): Payer: Self-pay

## 2017-11-18 DIAGNOSIS — J181 Lobar pneumonia, unspecified organism: Secondary | ICD-10-CM

## 2017-11-18 DIAGNOSIS — J189 Pneumonia, unspecified organism: Secondary | ICD-10-CM

## 2017-11-18 MED ORDER — DOXYCYCLINE HYCLATE 100 MG PO CAPS: 100.0000 mg | ORAL_CAPSULE | Freq: Two times a day (BID) | ORAL | 0 refills | Status: DC

## 2017-11-18 MED ORDER — AEROCHAMBER PLUS FLO-VU LARGE MISC
Status: AC
  Filled 2017-11-18: qty 1

## 2017-11-18 MED ORDER — ALBUTEROL SULFATE HFA 108 (90 BASE) MCG/ACT IN AERS
2.0000 | INHALATION_SPRAY | Freq: Once | RESPIRATORY_TRACT | Status: AC
  Administered 2017-11-18: 2 via RESPIRATORY_TRACT

## 2017-11-18 MED ORDER — ACETAMINOPHEN 325 MG PO TABS
650.0000 mg | ORAL_TABLET | Freq: Once | ORAL | Status: AC
  Administered 2017-11-18: 650 mg via ORAL

## 2017-11-18 MED ORDER — AEROCHAMBER PLUS FLO-VU MEDIUM MISC
1.0000 | Freq: Once | Status: AC
  Administered 2017-11-18: 1

## 2017-11-18 MED ORDER — ALBUTEROL SULFATE HFA 108 (90 BASE) MCG/ACT IN AERS
INHALATION_SPRAY | RESPIRATORY_TRACT | Status: AC
  Filled 2017-11-18: qty 6.7

## 2017-11-18 NOTE — Discharge Instructions (Signed)
As discussed, chest x-ray showed possible pneumonia.  This could be due to a virus or bacteria.  Will cover you for bacterial causes with doxycycline.  Albuterol inhaler as needed for shortness of breath/wheezing.  Continue Tylenol/Motrin for pain and fever. Keep hydrated, your urine should be clear to pale yellow in color.  Follow-up with PCP for monitoring needed.  If experiencing worsening symptoms, chest pain, worsening shortness of breath, go to the emergency department for further evaluation.

## 2017-11-18 NOTE — ED Triage Notes (Signed)
PT was seen here 10 days ago for UTI and given macrobid. Pt should have finished meds today, has 6 capsules left.   PT began having fever, SOB yesterday. Temp 102.4

## 2017-11-18 NOTE — ED Provider Notes (Signed)
[facility]    CSN: [id number] Arrival date & time: 11/18/17  4098     History   Chief Complaint Chief Complaint  Patient presents with  . Fever  . Shortness of Breath    HPI Heather Savage is a 27 y.o. female.   27 year old female comes in for 2-day history of fever, shortness of breath.  HPI obtained by patient through husband translating.  Patient was seen here 10 days ago for UTI, was given Macrobid.  Her urinary symptoms has completely resolved.  States 2 days ago, started having fever, shortness of breath, cough, rhinorrhea, nasal congestion.  States she feels as if she cannot take deep breath because something is stuck in her chest.  She denies chest pain.  Denies history of asthma, needing inhaler.  Has not taken anything for the symptoms.  Never smoker. History of malaria, denies recent travels.      Past Medical History:  Diagnosis Date  . Anemia    states has low blood count, no current meds.  Marland Kitchen Hearing loss    right  . History of gastritis    no current med.  Marland Kitchen History of malaria 2012  . Rh negative, maternal   . Tympanic membrane perforation 05/2012   right    Patient Active Problem List   Diagnosis Date Noted  . Language barrier 01/02/2016  . Hearing loss 05/01/2011    Past Surgical History:  Procedure Laterality Date  . EXTERNAL EAR SURGERY    . SCAR REVISION  08/27/2011   Procedure: SCAR REVISION;  Surgeon: Louisa Second, MD;  Location: [facility];  Service: Plastics;  Laterality: N/A;  scar revision of forehead  . TYMPANOPLASTY  05/14/2011   Procedure: TYMPANOPLASTY;  Surgeon: Serena Colonel, MD;  Location: [facility];  Service: ENT;  Laterality: Left;  TYMPANOPLASYT AND  POSSIBLE OSSICULARPLASTY   . TYMPANOPLASTY  10/10/2011   Procedure: TYMPANOPLASTY;  Surgeon: Serena Colonel, MD;  Location[facility];  Service: ENT;  Laterality: Right;  . TYMPANOPLASTY Right 06/30/2012   Procedure: REVISION RIGHT  TYMPANOPLASTY;  Surgeon: Serena Colonel, MD;  Location: [facility];  Service: ENT;  Laterality: Right;    OB History    Gravida  2   Para  1   Term  1   Preterm  0   AB  1   Living  1     SAB  1   TAB  0   Ectopic  0   Multiple      Live Births  1            Home Medications    Prior to Admission medications   Medication Sig Start Date End Date Taking? Authorizing Provider  doxycycline (VIBRAMYCIN) 100 MG capsule Take 1 capsule (100 mg total) by mouth 2 (two) times daily. 11/18/17   Belinda Fisher, PA-C    Family History No family history on file.  Social History Social History   Tobacco Use  . Smoking status: Never Smoker  Substance Use Topics  . Alcohol use: Never    Frequency: Never  . Drug use: Never     Allergies   Patient has no known allergies.   Review of Systems Review of Systems  Reason unable to perform ROS: See HPI as above.     Physical Exam Triage Vital Signs ED Triage Vitals [11/18/17 1041]  Enc Vitals Group     BP 114/74  Pulse Rate (!) 110     Resp (!) 22     Temp (!) 102.4 F (39.1 C)     Temp src      SpO2 97 %     Weight      Height      Head Circumference      Peak Flow      Pain Score 6     Pain Loc      Pain Edu?      Excl. in GC?    No data found.  Updated Vital Signs BP 114/74   Pulse (!) 110   Temp (!) 102.4 F (39.1 C)   Resp (!) 22   LMP 10/25/2017   SpO2 97%   Physical Exam  Constitutional: She is oriented to person, place, and time. She appears well-developed and well-nourished.  Non-toxic appearance. She does not appear ill.  Does not look comfortable, but nontoxic in appearance, in no acute distress.  HENT:  Head: Normocephalic and atraumatic.  Right Ear: Tympanic membrane, external ear and ear canal normal. Tympanic membrane is not erythematous and not bulging.  Left Ear: Tympanic membrane, external ear and ear canal normal. Tympanic membrane is not erythematous and not  bulging.  Nose: Rhinorrhea present. Right sinus exhibits no maxillary sinus tenderness and no frontal sinus tenderness. Left sinus exhibits no maxillary sinus tenderness and no frontal sinus tenderness.  Mouth/Throat: Uvula is midline, oropharynx is clear and moist and mucous membranes are normal. No tonsillar exudate.  Eyes: Pupils are equal, round, and reactive to light. Conjunctivae are normal.  Neck: Normal range of motion. Neck supple.  Cardiovascular: Normal rate, regular rhythm and normal heart sounds. Exam reveals no gallop and no friction rub.  No murmur heard. Pulmonary/Chest: Effort normal. No accessory muscle usage or stridor. No respiratory distress.  Exam limited as patient not taking/unable to take deep breaths. No accessory muscle use. No increase in effort. No obvious adventitious lung sound on exam.   Lymphadenopathy:    She has no cervical adenopathy.  Neurological: She is alert and oriented to person, place, and time.  Skin: Skin is warm and dry.  Psychiatric: She has a normal mood and affect. Her behavior is normal. Judgment normal.     UC Treatments / Results  Labs (all labs ordered are listed, but only abnormal results are displayed) Labs Reviewed - No data to display  EKG None  Radiology Dg Chest 2 View  Result Date: 11/18/2017 CLINICAL DATA:  27 year old female currently ill with high-grade fever EXAM: CHEST - 2 VIEW COMPARISON:  None. FINDINGS: Slightly low inspiratory volumes. Scattered patchy airspace opacities throughout the right mid lung. Cardiac and mediastinal contours are normal. No pneumothorax or pleural effusion. No acute osseous abnormality. IMPRESSION: Scattered patchy airspace opacities throughout the right lung, largely distributed in the mid lung. Additionally, there is mild peribronchial thickening. Differential considerations include bronchitis with developing bronchopneumonia versus atypical or viral pneumonia. Electronically Signed   By:  Malachy Moan M.D.   On: 11/18/2017 12:03    Procedures Procedures (including critical care time)  Medications Ordered in UC Medications  acetaminophen (TYLENOL) tablet 650 mg (650 mg Oral Given 11/18/17 1043)  albuterol (PROVENTIL HFA;VENTOLIN HFA) 108 (90 Base) MCG/ACT inhaler 2 puff (2 puffs Inhalation Given 11/18/17 1227)  AEROCHAMBER PLUS FLO-VU MEDIUM MISC 1 each (1 each Other Given 11/18/17 1228)    Initial Impression / Assessment and Plan / UC Course  I have reviewed the triage vital signs  and the nursing notes.  Pertinent labs & imaging results that were available during my care of the patient were reviewed by me and considered in my medical decision making (see chart for details).    Discussed x-ray results with patient and husband.  Will cover for bacterial pneumonia with doxycycline.  Albuterol as needed for shortness of breath and wheezing.  Push fluids.  Return precautions given.  Patient and husband expresses understanding and agrees to plan.  Final Clinical Impressions(s) / UC Diagnoses   Final diagnoses:  Pneumonia of right middle lobe due to infectious organism [facility]    ED Prescriptions    Medication Sig Dispense Auth. Provider   doxycycline (VIBRAMYCIN) 100 MG capsule Take 1 capsule (100 mg total) by mouth 2 (two) times daily. 20 capsule Threasa Alpha, [place] 11/18/17 1248

## 2018-01-29 NOTE — L&D Delivery Note (Signed)
[facility] Delivery Note  Heather Savage is a 28 y.o. now G3P1011 s/p SVD at [redacted]w[redacted]d who was admitted for IOL for non-reassuring fetal testing.   ROM: 2h 29m with clear fluid GBS Status: Negative Maximum Maternal Temperature: 98.3     Heather, Sliva Girl Savage [[id number]]  Delivery Note At 4:04 AM a viable female was delivered via Vaginal, Spontaneous (Presentation: ROA).  APGAR: 9/9; weight pending.   Placenta status: spontaneous; intact via Delena Bali.  Cord: 3VC with no complications.  Cord pH: n/a  Anesthesia: IV Fentanyl Episiotomy: None Lacerations: Periurethral - hemostatic Suture Repair: none Est. Blood Loss (mL): 757   Postpartum Planning [x]  Mom to postpartum.  Baby to Couplet care / Skin to Skin. [x]  plans to breast feed [x]  message to sent to schedule follow-up  [x]  vaccines UTD  Laury Deep, MSN, CNM 09/09/18, 4:37 AM

## 2018-03-05 ENCOUNTER — Ambulatory Visit (INDEPENDENT_AMBULATORY_CARE_PROVIDER_SITE_OTHER): Payer: Medicaid Other

## 2018-03-05 VITALS — BP 103/72 | HR 78 | Resp 16 | Ht 63.0 in | Wt 144.1 lb

## 2018-03-05 DIAGNOSIS — Z3200 Encounter for pregnancy test, result unknown: Secondary | ICD-10-CM

## 2018-03-05 DIAGNOSIS — Z3201 Encounter for pregnancy test, result positive: Secondary | ICD-10-CM | POA: Diagnosis not present

## 2018-03-05 LAB — POCT URINE PREGNANCY: PREG TEST UR: POSITIVE — AB

## 2018-03-05 NOTE — Progress Notes (Signed)
[name] presents today for UPT. She has no unusual complaints.  LMP: 12/22/2017 EDD: 09/28/2018    OBJECTIVE: Appears well, in no apparent distress.  OB History    Gravida  3   Para  1   Term  1   Preterm  0   AB  1   Living  1     SAB  1   TAB  0   Ectopic  0   Multiple      Live Births  1          Home UPT Result: about 3-4 weeks ago: Positive In-Office UPT result: Positive  I have reviewed the patient's medical, obstetrical, social, and family histories, and medications.   ASSESSMENT: Positive pregnancy test  PLAN:  [facility] Begin taking Prenatal vitamins. For nausea: Take OTC Unisom and B 6 as directed. Schedule Initial OB visit between 10 - 12 weeks. If you begin to experience: chest pain, shortness of breath, acute abdominal pain or bleeding, [facility] and [facility] accordingly.  PATIENT REQUEST FEMALE PROVIDER FOR HER PREGNANCY CARE

## 2018-03-06 NOTE — Progress Notes (Signed)
Agree AP 

## 2018-03-19 ENCOUNTER — Encounter: Payer: Self-pay | Admitting: *Deleted

## 2018-03-19 ENCOUNTER — Encounter: Payer: Self-pay | Admitting: Certified Nurse Midwife

## 2018-03-19 ENCOUNTER — Ambulatory Visit (INDEPENDENT_AMBULATORY_CARE_PROVIDER_SITE_OTHER): Payer: Medicaid Other | Admitting: Certified Nurse Midwife

## 2018-03-19 ENCOUNTER — Other Ambulatory Visit (HOSPITAL_COMMUNITY)
Admission: RE | Admit: 2018-03-19 | Discharge: 2018-03-19 | Disposition: A | Discharge status: Home / Self Care | Payer: Medicaid Other | Source: Ambulatory Visit | Attending: Certified Nurse Midwife | Admitting: Certified Nurse Midwife

## 2018-03-19 VITALS — BP 103/71 | HR 77 | Wt 140.0 lb

## 2018-03-19 DIAGNOSIS — Z3480 Encounter for supervision of other normal pregnancy, unspecified trimester: Secondary | ICD-10-CM | POA: Insufficient documentation

## 2018-03-19 DIAGNOSIS — Z3481 Encounter for supervision of other normal pregnancy, first trimester: Secondary | ICD-10-CM

## 2018-03-19 DIAGNOSIS — Z789 Other specified health status: Secondary | ICD-10-CM

## 2018-03-19 DIAGNOSIS — Z3A12 12 weeks gestation of pregnancy: Secondary | ICD-10-CM

## 2018-03-19 DIAGNOSIS — Z3482 Encounter for supervision of other normal pregnancy, second trimester: Secondary | ICD-10-CM | POA: Diagnosis not present

## 2018-03-19 DIAGNOSIS — O2341 Unspecified infection of urinary tract in pregnancy, first trimester: Secondary | ICD-10-CM

## 2018-03-19 DIAGNOSIS — N390 Urinary tract infection, site not specified: Secondary | ICD-10-CM

## 2018-03-19 DIAGNOSIS — O219 Vomiting of pregnancy, unspecified: Secondary | ICD-10-CM

## 2018-03-19 DIAGNOSIS — B9620 Unspecified Escherichia coli [E. coli] as the cause of diseases classified elsewhere: Secondary | ICD-10-CM

## 2018-03-19 MED ORDER — ONDANSETRON 4 MG PO TBDP: 4.0000 mg | ORAL_TABLET | Freq: Three times a day (TID) | ORAL | 2 refills | Status: DC | PRN

## 2018-03-19 MED ORDER — PREPLUS 27-1 MG PO TABS: 1.0000 | ORAL_TABLET | Freq: Every day | ORAL | 13 refills | Status: DC

## 2018-03-19 NOTE — Patient Instructions (Signed)
First Trimester of Pregnancy  The first trimester of pregnancy is from week 1 until the end of week 13 (months 1 through 3). During this time, your baby will begin to develop inside you. At 6-8 weeks, the eyes and face are formed, and the heartbeat can be seen on ultrasound. At the end of 12 weeks, all the baby's organs are formed. Prenatal care is all the medical care you receive before the birth of your baby. Make sure you get good prenatal care and follow all of your doctor's instructions. Follow these instructions at home: Medicines  Take over-the-counter and prescription medicines only as told by your doctor. Some medicines are safe and some medicines are not safe during pregnancy.  Take a prenatal vitamin that contains at least 600 micrograms (mcg) of folic acid.  If you have trouble pooping (constipation), take medicine that will make your stool soft (stool softener) if your doctor approves. Eating and drinking   Eat regular, healthy meals.  Your doctor will tell you the amount of weight gain that is right for you.  Avoid raw meat and uncooked cheese.  If you feel sick to your stomach (nauseous) or throw up (vomit): ? Eat 4 or 5 small meals a day instead of 3 large meals. ? Try eating a few soda crackers. ? Drink liquids between meals instead of during meals.  To prevent constipation: ? Eat foods that are high in fiber, like fresh fruits and vegetables, whole grains, and beans. ? Drink enough fluids to keep your pee (urine) clear or pale yellow. Activity  Exercise only as told by your doctor. Stop exercising if you have cramps or pain in your lower belly (abdomen) or low back.  Do not exercise if it is too hot, too humid, or if you are in a place of great height (high altitude).  Try to avoid standing for long periods of time. Move your legs often if you must stand in one place for a long time.  Avoid heavy lifting.  Wear low-heeled shoes. Sit and stand up straight.   You can have sex unless your doctor tells you not to. Relieving pain and discomfort  Wear a good support bra if your breasts are sore.  Take warm water baths (sitz baths) to soothe pain or discomfort caused by hemorrhoids. Use hemorrhoid cream if your doctor says it is okay.  Rest with your legs raised if you have leg cramps or low back pain.  If you have puffy, bulging veins (varicose veins) in your legs: ? Wear support hose or compression stockings as told by your doctor. ? Raise (elevate) your feet for 15 minutes, 3-4 times a day. ? Limit salt in your food. Prenatal care  Schedule your prenatal visits by the twelfth week of pregnancy.  Write down your questions. Take them to your prenatal visits.  Keep all your prenatal visits as told by your doctor. This is important. Safety  Wear your seat belt at all times when driving.  Make a list of emergency phone numbers. The list should include numbers for family, friends, the hospital, and police and fire departments. General instructions  Ask your doctor for a referral to a local prenatal class. Begin classes no later than at the start of month 6 of your pregnancy.  Ask for help if you need counseling or if you need help with nutrition. Your doctor can give you advice or tell you where to go for help.  Do not use hot tubs, steam   rooms, or saunas.  Do not douche or use tampons or scented sanitary pads.  Do not cross your legs for long periods of time.  Avoid all herbs and alcohol. Avoid drugs that are not approved by your doctor.  Do not use any tobacco products, including cigarettes, chewing tobacco, and electronic cigarettes. If you need help quitting, ask your doctor. You may get counseling or other support to help you quit.  Avoid cat litter boxes and soil used by cats. These carry germs that can cause birth defects in the baby and can cause a loss of your baby (miscarriage) or stillbirth.  Visit your dentist. At home,  brush your teeth with a soft toothbrush. Be gentle when you floss. Contact a doctor if:  You are dizzy.  You have mild cramps or pressure in your lower belly.  You have a nagging pain in your belly area.  You continue to feel sick to your stomach, you throw up, or you have watery poop (diarrhea).  You have a bad smelling fluid coming from your vagina.  You have pain when you pee (urinate).  You have increased puffiness (swelling) in your face, hands, legs, or ankles. Get help right away if:  You have a fever.  You are leaking fluid from your vagina.  You have spotting or bleeding from your vagina.  You have very bad belly cramping or pain.  You gain or lose weight rapidly.  You throw up blood. It may look like coffee grounds.  You are around people who have German measles, fifth disease, or chickenpox.  You have a very bad headache.  You have shortness of breath.  You have any kind of trauma, such as from a fall or a car accident. Summary  The first trimester of pregnancy is from week 1 until the end of week 13 (months 1 through 3).  To take care of yourself and your unborn baby, you will need to eat healthy meals, take medicines only if your doctor tells you to do so, and do activities that are safe for you and your baby.  Keep all follow-up visits as told by your doctor. This is important as your doctor will have to ensure that your baby is healthy and growing well. This information is not intended to replace advice given to you by your health care provider. Make sure you discuss any questions you have with your health care provider. Document Released: 07/04/2007 Document Revised: 01/24/2016 Document Reviewed: 01/24/2016 Elsevier Interactive Patient Education  2019 Elsevier Inc.  

## 2018-03-20 ENCOUNTER — Encounter: Payer: Self-pay | Admitting: Certified Nurse Midwife

## 2018-03-20 LAB — CYTOLOGY - PAP
Bacterial vaginitis: NEGATIVE
Candida vaginitis: NEGATIVE
Chlamydia: NEGATIVE
Diagnosis: NEGATIVE
Neisseria Gonorrhea: NEGATIVE
Trichomonas: NEGATIVE

## 2018-03-20 NOTE — Progress Notes (Signed)
History:   Heather Savage is a 28 y.o. G3P1011 at [redacted]w[redacted]d by LMP being seen today for her first obstetrical visit.  Her obstetrical history is significant for nothing. Patient does intend to breast feed. Pregnancy history fully reviewed.  Patient reports nausea and vomiting.      HISTORY: OB History  Gravida Para Term Preterm AB Living  3 1 1  0 1 1  SAB TAB Ectopic Multiple Live Births  1 0 0 0 1    # Outcome Date GA Lbr Len/2nd Weight Sex Delivery Anes PTL Lv  3 Current           2 Term 06/27/16 [redacted]w[redacted]d 11:38 / 00:30 7 lb 7.8 oz (3.396 kg) M Vag-Spont None  LIV     Name: Heather Savage     Apgar1: 9  Apgar5: 9  1 SAB 03/29/13            Last pap smear was done 12/2015 and was normal  Past Medical History:  Diagnosis Date  . Anemia    states has low blood count, no current meds.  Marland Kitchen Hearing loss    right  . History of gastritis    no current med.  Marland Kitchen History of malaria 2012  . Rh negative, maternal   . Tympanic membrane perforation 05/2012   right   Past Surgical History:  Procedure Laterality Date  . EXTERNAL EAR SURGERY    . SCAR REVISION  08/27/2011   Procedure: SCAR REVISION;  Surgeon: Louisa Second, MD;  Location: [facility];  Service: Plastics;  Laterality: N/A;  scar revision of forehead  . TYMPANOPLASTY  05/14/2011   Procedure: TYMPANOPLASTY;  Surgeon: Serena Colonel, MD;  Location: [facility];  Service: ENT;  Laterality: Left;  TYMPANOPLASYT AND  POSSIBLE OSSICULARPLASTY   . TYMPANOPLASTY  10/10/2011   Procedure: TYMPANOPLASTY;  Surgeon: Serena Colonel, MD;  Location: [facility];  Service: ENT;  Laterality: Right;  . TYMPANOPLASTY Right 06/30/2012   Procedure: REVISION RIGHT TYMPANOPLASTY;  Surgeon: Serena Colonel, MD;  Location: [facility];  Service: ENT;  Laterality: Right;   History reviewed. No pertinent family history. Social History   Tobacco Use  . Smoking status: Never Smoker  . Smokeless tobacco: Never  Used  Substance Use Topics  . Alcohol use: Never    Frequency: Never  . Drug use: Never   No Known Allergies No current outpatient medications on file prior to visit.   No current facility-administered medications on file prior to visit.     Review of Systems Pertinent items noted in HPI and remainder of comprehensive ROS otherwise negative. Physical Exam:   Vitals:   03/19/18 1513  BP: 103/71  Pulse: 77  Weight: 140 lb (63.5 kg)   Fetal Heart Rate (bpm): 160 Pelvic Exam: Perineum: no hemorrhoids, normal perineum   Vulva: normal external genitalia, no lesions   Vagina:  normal mucosa, normal discharge   Cervix: no lesions and normal, pap smear done.    Adnexa: normal adnexa and no mass, fullness, tenderness   Bony Pelvis: average  System: General: well-developed, well-nourished female in no acute distress   Skin: normal coloration and turgor, no rashes   Neurologic: oriented, normal, negative, normal mood   Extremities: normal strength, tone, and muscle mass, ROM of all joints is normal   HEENT PERRLA, extraocular movement intact and sclera clear   Mouth/Teeth mucous membranes moist, pharynx normal without lesions and dental hygiene good  Neck supple and no masses   Cardiovascular: regular rate and rhythm   Respiratory:  no respiratory distress, normal breath sounds   Abdomen: soft, non-tender; bowel sounds normal; no masses,  no organomegaly     Assessment:    Pregnancy: G3P1011 Patient Active Problem List   Diagnosis Date Noted  . Supervision of other normal pregnancy, antepartum 03/19/2018  . Language barrier 01/02/2016  . Hearing loss 05/01/2011     Plan:    1. Supervision of other normal pregnancy, antepartum - Routine prenatal care  - Patient declines flu vaccine at this time  - Culture, OB Urine - Hemoglobinopathy evaluation - Cystic Fibrosis Mutation 97 - SMN1 Copy Number Analysis - Obstetric Panel, Including HIV - Genetic Screening - Cytology -  PAP( [place]) - [place] MFM OB COMP + 14 WK; Future - Prenatal Vit-Fe Fumarate-FA (PREPLUS) 27-1 MG TABS; Take 1 tablet by mouth daily.  Dispense: 30 tablet; Refill: 13  2. Nausea and vomiting in pregnancy - Reports occasional nausea and vomiting daily  - ondansetron (ZOFRAN ODT) 4 MG disintegrating tablet; Take 1 tablet (4 mg total) by mouth every 8 (eight) hours as needed for nausea or vomiting.  Dispense: 30 tablet; Refill: 2  3. Language barrier - Signed documentation for Husband to be used as interpreter   Initial labs drawn. Continue prenatal vitamins. Genetic Screening discussed, NIPS: ordered. Ultrasound discussed; fetal anatomic survey: ordered. Problem list reviewed and updated. The nature of [facility] with multiple MDs and other Advanced Practice Providers was explained to patient; also emphasized that residents, students are part of our team. Routine obstetric precautions reviewed. Return in about 4 weeks (around 04/16/2018) for ROB.     Sharyon Cable, CNM [facility] [employer], [facility]

## 2018-03-25 LAB — URINE CULTURE, OB REFLEX

## 2018-03-25 LAB — CULTURE, OB URINE

## 2018-03-26 ENCOUNTER — Encounter: Payer: Self-pay | Admitting: Certified Nurse Midwife

## 2018-03-26 ENCOUNTER — Telehealth: Payer: Self-pay

## 2018-03-26 ENCOUNTER — Other Ambulatory Visit: Payer: Self-pay | Admitting: Nurse Practitioner

## 2018-03-26 DIAGNOSIS — N390 Urinary tract infection, site not specified: Secondary | ICD-10-CM

## 2018-03-26 DIAGNOSIS — B9620 Unspecified Escherichia coli [E. coli] as the cause of diseases classified elsewhere: Secondary | ICD-10-CM | POA: Insufficient documentation

## 2018-03-26 MED ORDER — VITAFOL ULTRA 29-0.6-0.4-200 MG PO CAPS: 1.0000 | ORAL_CAPSULE | Freq: Every day | ORAL | 5 refills | Status: AC

## 2018-03-26 MED ORDER — CEPHALEXIN 500 MG PO CAPS: 500.0000 mg | ORAL_CAPSULE | Freq: Four times a day (QID) | ORAL | 0 refills | Status: DC

## 2018-03-26 NOTE — Telephone Encounter (Signed)
  Came in complaining that the PreNatal Plus is making her nauseous, causing her to throw up.  Midwife eprescribe different, smaller PNV.  Patient to let us know how these work.

## 2018-03-26 NOTE — Addendum Note (Signed)
Addended by: Sharyon Cable on: 03/26/2018 02:48 PM   Modules accepted: Orders

## 2018-03-31 LAB — OBSTETRIC PANEL, INCLUDING HIV
Antibody Screen: NEGATIVE
Basophils Absolute: 0 10*3/uL (ref 0.0–0.2)
Basos: 0 %
EOS (ABSOLUTE): 0 10*3/uL (ref 0.0–0.4)
Eos: 0 %
HIV Screen 4th Generation wRfx: NONREACTIVE
Hematocrit: 33.8 % — ABNORMAL LOW (ref 34.0–46.6)
Hemoglobin: 12 g/dL (ref 11.1–15.9)
Hepatitis B Surface Ag: NEGATIVE
Immature Grans (Abs): 0 10*3/uL (ref 0.0–0.1)
Immature Granulocytes: 0 %
Lymphocytes Absolute: 1.3 10*3/uL (ref 0.7–3.1)
Lymphs: 24 %
MCH: 31.7 pg (ref 26.6–33.0)
MCHC: 35.5 g/dL (ref 31.5–35.7)
MCV: 89 fL (ref 79–97)
Monocytes Absolute: 0.3 10*3/uL (ref 0.1–0.9)
Monocytes: 5 %
Neutrophils Absolute: 4 10*3/uL (ref 1.4–7.0)
Neutrophils: 71 %
Platelets: 274 10*3/uL (ref 150–450)
RBC: 3.79 x10E6/uL (ref 3.77–5.28)
RDW: 13.2 % (ref 11.7–15.4)
RPR Ser Ql: NONREACTIVE
Rh Factor: NEGATIVE
Rubella Antibodies, IGG: 8.22 index (ref >0.99)
WBC: 5.6 10*3/uL (ref 3.4–10.8)

## 2018-03-31 LAB — SMN1 COPY NUMBER ANALYSIS (SMA CARRIER SCREENING)

## 2018-03-31 LAB — HEMOGLOBINOPATHY EVALUATION
HGB C: 0 %
HGB S: 0 %
HGB VARIANT: 0 %
Hemoglobin A2 Quantitation: 2.3 % (ref 1.8–3.2)
Hemoglobin F Quantitation: 0 % (ref 0.0–2.0)
Hgb A: 97.7 % (ref 96.4–98.8)

## 2018-03-31 LAB — CYSTIC FIBROSIS MUTATION 97: Interpretation: NOT DETECTED

## 2018-04-16 ENCOUNTER — Ambulatory Visit (INDEPENDENT_AMBULATORY_CARE_PROVIDER_SITE_OTHER): Payer: Medicaid Other | Admitting: Family Medicine

## 2018-04-16 ENCOUNTER — Other Ambulatory Visit: Payer: Self-pay

## 2018-04-16 VITALS — BP 103/66 | HR 78 | Wt 139.0 lb

## 2018-04-16 DIAGNOSIS — B9620 Unspecified Escherichia coli [E. coli] as the cause of diseases classified elsewhere: Secondary | ICD-10-CM

## 2018-04-16 DIAGNOSIS — N390 Urinary tract infection, site not specified: Principal | ICD-10-CM

## 2018-04-16 DIAGNOSIS — Z3A16 16 weeks gestation of pregnancy: Secondary | ICD-10-CM

## 2018-04-16 DIAGNOSIS — O2342 Unspecified infection of urinary tract in pregnancy, second trimester: Secondary | ICD-10-CM

## 2018-04-16 DIAGNOSIS — Z3480 Encounter for supervision of other normal pregnancy, unspecified trimester: Secondary | ICD-10-CM

## 2018-04-16 DIAGNOSIS — Z789 Other specified health status: Secondary | ICD-10-CM

## 2018-04-16 NOTE — Patient Instructions (Signed)

## 2018-04-16 NOTE — Progress Notes (Signed)
   PRENATAL VISIT NOTE  Subjective:  Heather Savage is a 28 y.o. G3P1011 at [redacted]w[redacted]d being seen today for ongoing prenatal care.  She is currently monitored for the following issues for this low-risk pregnancy and has Hearing loss; Language barrier; Supervision of other normal pregnancy, antepartum; and E. coli UTI (urinary tract infection) on their problem list.  Patient reports backache and lower abdominal pain x 4 days. No dysuria, no issues with her bowels..  Contractions: Not present. Vag. Bleeding: None.   . Denies leaking of fluid.   The following portions of the patient's history were reviewed and updated as appropriate: allergies, current medications, past family history, past medical history, past social history, past surgical history and problem list.   Objective:   Vitals:   04/16/18 1524  BP: 103/66  Pulse: 78  Weight: 139 lb (63 kg)    Fetal Status: Fetal Heart Rate (bpm): 150         General:  Alert, oriented and cooperative. Patient is in no acute distress.  Skin: Skin is warm and dry. No rash noted.   Cardiovascular: Normal heart rate noted  Respiratory: Normal respiratory effort, no problems with respiration noted  Abdomen: Soft, gravid, appropriate for gestational age.  Pain/Pressure: Present     Pelvic: Cervical exam performed Dilation: Closed Effacement (%): Thick Station: Ballotable  Extremities: Normal range of motion.     Mental Status: Normal mood and affect. Normal behavior. Normal judgment and thought content.   Assessment and Plan:  Pregnancy: G3P1011 at [redacted]w[redacted]d 1. Supervision of other normal pregnancy, antepartum Suspect round ligament pain-advised heat and tylenol  2. E. coli UTI (urinary tract infection) Has taken all treatment  3. Language barrier Husband used as interpreter--advised to use hospital provided services.  General obstetric precautions including but not limited to vaginal bleeding, contractions, leaking of fluid and fetal movement  were reviewed in detail with the patient. Please refer to After Visit Summary for other counseling recommendations.   Return in 4 weeks (on 05/14/2018).  Future Appointments  Date Time Provider Department Center  05/05/2018  3:15 PM [facility] [place] 4 [facility]    Reva Bores, MD

## 2018-05-05 ENCOUNTER — Ambulatory Visit (HOSPITAL_COMMUNITY): Payer: Medicaid Other

## 2018-05-15 ENCOUNTER — Telehealth: Payer: Self-pay

## 2018-05-15 MED ORDER — NATACHEW 28-1 MG PO CHEW: 1.0000 | CHEWABLE_TABLET | Freq: Every day | ORAL | 12 refills | Status: DC

## 2018-05-15 NOTE — Telephone Encounter (Signed)
-----   Message from Pennie Banter sent at 05/15/2018  2:37 PM EDT ----- Can you please send in a new Rx for prenatal vitamins.  She does not like the kind she has.  She want the ones she had last pregnancy.   She does not know name but says they are small and white.

## 2018-05-15 NOTE — Telephone Encounter (Signed)
Rx for PNV sent per patients request.

## 2018-05-16 ENCOUNTER — Ambulatory Visit (HOSPITAL_COMMUNITY)
Admission: RE | Admit: 2018-05-16 | Discharge: 2018-05-16 | Disposition: A | Discharge status: Home / Self Care | Payer: Medicaid Other | Source: Ambulatory Visit | Attending: Obstetrics and Gynecology | Admitting: Obstetrics and Gynecology

## 2018-05-16 ENCOUNTER — Other Ambulatory Visit: Payer: Self-pay

## 2018-05-16 DIAGNOSIS — Z3A20 20 weeks gestation of pregnancy: Secondary | ICD-10-CM | POA: Diagnosis not present

## 2018-05-16 DIAGNOSIS — Z363 Encounter for antenatal screening for malformations: Secondary | ICD-10-CM

## 2018-05-16 DIAGNOSIS — Z3480 Encounter for supervision of other normal pregnancy, unspecified trimester: Secondary | ICD-10-CM | POA: Diagnosis not present

## 2018-05-21 ENCOUNTER — Ambulatory Visit (INDEPENDENT_AMBULATORY_CARE_PROVIDER_SITE_OTHER): Payer: Medicaid Other | Admitting: Certified Nurse Midwife

## 2018-05-21 ENCOUNTER — Telehealth: Payer: Self-pay | Admitting: Obstetrics & Gynecology

## 2018-05-21 ENCOUNTER — Other Ambulatory Visit: Payer: Self-pay

## 2018-05-21 DIAGNOSIS — Z3A21 21 weeks gestation of pregnancy: Secondary | ICD-10-CM

## 2018-05-21 DIAGNOSIS — Z6791 Unspecified blood type, Rh negative: Secondary | ICD-10-CM

## 2018-05-21 DIAGNOSIS — Z789 Other specified health status: Secondary | ICD-10-CM

## 2018-05-21 DIAGNOSIS — O360120 Maternal care for anti-D [Rh] antibodies, second trimester, not applicable or unspecified: Secondary | ICD-10-CM

## 2018-05-21 DIAGNOSIS — Z3480 Encounter for supervision of other normal pregnancy, unspecified trimester: Secondary | ICD-10-CM

## 2018-05-21 DIAGNOSIS — O26892 Other specified pregnancy related conditions, second trimester: Secondary | ICD-10-CM

## 2018-05-21 DIAGNOSIS — Z3482 Encounter for supervision of other normal pregnancy, second trimester: Secondary | ICD-10-CM

## 2018-05-21 NOTE — Telephone Encounter (Signed)
Attempted to call patient to see if she was able to come in today, or tomorrow for lab draw. Left VM

## 2018-05-21 NOTE — Progress Notes (Signed)
Pt is on the phone for televisit. [redacted]w[redacted]d. Pt is not taking the prenatal vitamin, says it makes her sick. Pt does not have a BP cuff at home, advised pt that I will leave a cuff at front desk for her to come pick up- pt verbalizes understanding.

## 2018-05-21 NOTE — Progress Notes (Signed)
   TELEHEALTH VIRTUAL OBSTETRICS VISIT ENCOUNTER NOTE  I connected with Heather Savage on 05/21/18 at  3:14 PM EDT by telephone at home and verified that I am speaking with the correct person using two identifiers.   I discussed the limitations, risks, security and privacy concerns of performing an evaluation and management service by telephone and the availability of in person appointments. I also discussed with the patient that there may be a patient responsible charge related to this service. The patient expressed understanding and agreed to proceed.  Subjective:  Heather Savage is a 28 y.o. G3P1011 at [redacted]w[redacted]d being followed for ongoing prenatal care.  She is currently monitored for the following issues for this low-risk pregnancy and has Hearing loss; Language barrier; Supervision of other normal pregnancy, antepartum; and E. coli UTI (urinary tract infection) on their problem list.  Patient reports backache. Reports fetal movement. Denies any contractions, bleeding or leaking of fluid.   The following portions of the patient's history were reviewed and updated as appropriate: allergies, current medications, past family history, past medical history, past social history, past surgical history and problem list.   Objective:   General:  Alert, oriented and cooperative.   Mental Status: Normal mood and affect perceived. Normal judgment and thought content.  Rest of physical exam deferred due to type of encounter  Assessment and Plan:  Pregnancy: G3P1011 at [redacted]w[redacted]d 1. Supervision of other normal pregnancy, antepartum - Patient reports cramping and back pain with movement and position changes, denies vaginal discharge or vaginal bleeding, denies contractions, most likely RLP - currently has maternity support belt from last pregnancy but not using this pregnancy, encouraged patient to use maternity support belt for back pain and RLP.  - has lab appointment scheduled for tomorrow for  AFP, will pick up BP cuff at that appointment  - Anticipatory guidance on upcoming appointments  - COVID 19 precautions  - Routine prenatal care   2. Language barrier - Interpreter used with telehealth   3. Rh negative status during pregnancy  - Rhogam needed at 28 weeks   Preterm labor symptoms and general obstetric precautions including but not limited to vaginal bleeding, contractions, leaking of fluid and fetal movement were reviewed in detail with the patient.  I discussed the assessment and treatment plan with the patient. The patient was provided an opportunity to ask questions and all were answered. The patient agreed with the plan and demonstrated an understanding of the instructions. The patient was advised to call back or seek an in-person office evaluation/go to MAU at [facility] for any urgent or concerning symptoms. Please refer to After Visit Summary for other counseling recommendations.   I provided 10 minutes of non-face-to-face time during this encounter.  Return in about 4 weeks (around 06/18/2018) for ROB- telehealth (needs interpreter).  Future Appointments  Date Time Provider Department Center  05/22/2018 11:00 AM [facility] None  06/19/2018  3:30 PM Sharyon Cable, CNM [facility] None    Sharyon Cable, CNM [facility] [employer], [facility]

## 2018-05-22 ENCOUNTER — Other Ambulatory Visit: Payer: Medicaid Other

## 2018-05-22 DIAGNOSIS — Z3480 Encounter for supervision of other normal pregnancy, unspecified trimester: Secondary | ICD-10-CM | POA: Diagnosis not present

## 2018-05-24 LAB — AFP, SERUM, OPEN SPINA BIFIDA
AFP MoM: 0.96
AFP Value: 76.3 ng/mL
Gest. Age on Collection Date: 21.6 weeks
Maternal Age At EDD: 28.3 yr
OSBR Risk 1 IN: 10000
Test Results:: NEGATIVE
Weight: 139 [lb_av]

## 2018-06-11 ENCOUNTER — Other Ambulatory Visit: Payer: Self-pay | Admitting: *Deleted

## 2018-06-11 DIAGNOSIS — O9103 Infection of nipple associated with lactation: Secondary | ICD-10-CM

## 2018-06-11 DIAGNOSIS — R1013 Epigastric pain: Secondary | ICD-10-CM

## 2018-06-11 MED ORDER — OMEPRAZOLE 40 MG PO CPDR: 40.0000 mg | DELAYED_RELEASE_CAPSULE | Freq: Every day | ORAL | 0 refills | Status: DC

## 2018-06-11 MED ORDER — PRENATE PIXIE 10-0.6-0.4-200 MG PO CAPS: 1.0000 | ORAL_CAPSULE | Freq: Every day | ORAL | 12 refills | Status: DC

## 2018-06-11 NOTE — Progress Notes (Signed)
Spoke with pt husband about Rx's. States pt wants to try PNV that she took with last pregnancy, also needs something for heartburn. Prenate Pixie and Omeprazole sent to pharmacy.

## 2018-06-19 ENCOUNTER — Other Ambulatory Visit: Payer: Self-pay

## 2018-06-19 ENCOUNTER — Ambulatory Visit (INDEPENDENT_AMBULATORY_CARE_PROVIDER_SITE_OTHER): Payer: Medicaid Other

## 2018-06-19 VITALS — BP 103/70

## 2018-06-19 DIAGNOSIS — Z3482 Encounter for supervision of other normal pregnancy, second trimester: Secondary | ICD-10-CM | POA: Diagnosis not present

## 2018-06-19 DIAGNOSIS — Z3480 Encounter for supervision of other normal pregnancy, unspecified trimester: Secondary | ICD-10-CM

## 2018-06-19 DIAGNOSIS — Z3A25 25 weeks gestation of pregnancy: Secondary | ICD-10-CM | POA: Diagnosis not present

## 2018-06-19 NOTE — Progress Notes (Signed)
Pt is on the phone preparing for virtual visit with provider. [redacted]w[redacted]d.

## 2018-06-19 NOTE — Progress Notes (Signed)
   TELEHEALTH VIRTUAL OBSTETRICS VISIT ENCOUNTER NOTE  I connected with Heather Savage on 06/19/18 at  3:30 PM EDT by telephone at home and verified that I am speaking with the correct person using two identifiers.   I discussed the limitations, risks, security and privacy concerns of performing an evaluation and management service by telephone and the availability of in person appointments. I also discussed with the patient that there may be a patient responsible charge related to this service. The patient expressed understanding and agreed to proceed.  Subjective:  Heather Savage is a 28 y.o. G3P1011 at [redacted]w[redacted]d being followed for ongoing prenatal care.  She is currently monitored for the following issues for this low-risk pregnancy and has Hearing loss; Language barrier; Rh negative status during pregnancy; Supervision of other normal pregnancy, antepartum; and E. coli UTI (urinary tract infection) on their problem list.  Patient reports no complaints. Reports fetal movement. Denies any contractions, bleeding or leaking of fluid.   The following portions of the patient's history were reviewed and updated as appropriate: allergies, current medications, past family history, past medical history, past social history, past surgical history and problem list.   Objective:   General:  Alert, oriented and cooperative.   Mental Status: Normal mood and affect perceived. Normal judgment and thought content.  Rest of physical exam deferred due to type of encounter  Assessment and Plan:  Pregnancy: G3P1011 at [redacted]w[redacted]d 1. Supervision of other normal pregnancy, antepartum -BP 103/70 -Anticipatory guidance of next visit with GTT reviewed with patient - Encouraged patient to call ahead if experiencing respiratory symptoms with fever so that she can be triaged and directed appropriately if testing is warranted  Preterm labor symptoms and general obstetric precautions including but not limited to  vaginal bleeding, contractions, leaking of fluid and fetal movement were reviewed in detail with the patient.  I discussed the assessment and treatment plan with the patient. The patient was provided an opportunity to ask questions and all were answered. The patient agreed with the plan and demonstrated an understanding of the instructions. The patient was advised to call back or seek an in-person office evaluation/go to [facility] at [facility] for any urgent or concerning symptoms. Please refer to After Visit Summary for other counseling recommendations.   I provided 10 minutes of non-face-to-face time during this encounter.  Return in about 3 weeks (around 07/10/2018) for Return OB visit, 2hr GTT and labs.  Future Appointments  Date Time Provider Department Center  06/19/2018  3:30 PM Rolm Bookbinder, CNM [facility] None    Rolm Bookbinder, CNM [facility] [employer], [facility]

## 2018-07-10 ENCOUNTER — Other Ambulatory Visit: Payer: Medicaid Other

## 2018-07-10 ENCOUNTER — Ambulatory Visit (INDEPENDENT_AMBULATORY_CARE_PROVIDER_SITE_OTHER): Payer: Medicaid Other | Admitting: Obstetrics and Gynecology

## 2018-07-10 ENCOUNTER — Other Ambulatory Visit: Payer: Self-pay

## 2018-07-10 ENCOUNTER — Encounter: Payer: Self-pay | Admitting: Obstetrics and Gynecology

## 2018-07-10 VITALS — BP 99/64 | HR 90 | Wt 154.9 lb

## 2018-07-10 DIAGNOSIS — Z23 Encounter for immunization: Secondary | ICD-10-CM | POA: Diagnosis not present

## 2018-07-10 DIAGNOSIS — N3000 Acute cystitis without hematuria: Secondary | ICD-10-CM | POA: Diagnosis not present

## 2018-07-10 DIAGNOSIS — Z3A28 28 weeks gestation of pregnancy: Secondary | ICD-10-CM | POA: Diagnosis not present

## 2018-07-10 DIAGNOSIS — Z3480 Encounter for supervision of other normal pregnancy, unspecified trimester: Secondary | ICD-10-CM | POA: Diagnosis not present

## 2018-07-10 DIAGNOSIS — O360130 Maternal care for anti-D [Rh] antibodies, third trimester, not applicable or unspecified: Secondary | ICD-10-CM | POA: Diagnosis not present

## 2018-07-10 DIAGNOSIS — O26893 Other specified pregnancy related conditions, third trimester: Secondary | ICD-10-CM

## 2018-07-10 DIAGNOSIS — Z789 Other specified health status: Secondary | ICD-10-CM

## 2018-07-10 DIAGNOSIS — Z6791 Unspecified blood type, Rh negative: Secondary | ICD-10-CM

## 2018-07-10 DIAGNOSIS — O2313 Infections of bladder in pregnancy, third trimester: Secondary | ICD-10-CM

## 2018-07-10 MED ORDER — RHO D IMMUNE GLOBULIN 1500 UNIT/2ML IJ SOSY
300.0000 ug | PREFILLED_SYRINGE | Freq: Once | INTRAMUSCULAR | Status: AC
  Administered 2018-07-10: 09:00:00 300 ug via INTRAMUSCULAR

## 2018-07-10 NOTE — Progress Notes (Signed)
   PRENATAL VISIT NOTE  Subjective:  Heather Savage is a 28 y.o. G3P1011 at [redacted]w[redacted]d being seen today for ongoing prenatal care.  She is currently monitored for the following issues for this low-risk pregnancy and has Hearing loss; Language barrier; Rh negative status during pregnancy; Supervision of other normal pregnancy, antepartum; and E. coli UTI (urinary tract infection) on their problem list.  Patient reports pelvic pain.  Contractions: Irregular. Vag. Bleeding: None.  Movement: Present. Denies leaking of fluid.   The following portions of the patient's history were reviewed and updated as appropriate: allergies, current medications, past family history, past medical history, past social history, past surgical history and problem list.   Objective:   Vitals:   07/10/18 0844  BP: 99/64  Pulse: 90  Weight: 154 lb 14.4 oz (70.3 kg)    Fetal Status: Fetal Heart Rate (bpm): 148 Fundal Height: 27 cm Movement: Present     General:  Alert, oriented and cooperative. Patient is in no acute distress.  Skin: Skin is warm and dry. No rash noted.   Cardiovascular: Normal heart rate noted  Respiratory: Normal respiratory effort, no problems with respiration noted  Abdomen: Soft, gravid, appropriate for gestational age.  Pain/Pressure: Present     Pelvic: Cervical exam deferred        Extremities: Normal range of motion.  Edema: None  Mental Status: Normal mood and affect. Normal behavior. Normal judgment and thought content.   Assessment and Plan:  Pregnancy: G3P1011 at [redacted]w[redacted]d  1. Supervision of other normal pregnancy, antepartum - Glucose Tolerance, 2 Hours w/1 Hour - CBC - HIV antibody (with reflex) - RPR - vomited glucola, will need repeat - reassured her occasional pelvic pain/pressure is normal, to come to MAU with labor pain - undecided  2. Rh negative status during pregnancy in third trimester Rhogam today  3. Language barrier Landscape architect used  4. Acute  cystitis without hematuria - Urine Culture - TOC today   Preterm labor symptoms and general obstetric precautions including but not limited to vaginal bleeding, contractions, leaking of fluid and fetal movement were reviewed in detail with the patient. Please refer to After Visit Summary for other counseling recommendations.   Return in about 2 weeks (around 07/24/2018) for OB visit, virtual, repeat lab visit for 2 hr GTT.  Future Appointments  Date Time Provider [place]  07/10/2018 10:15 AM Sloan Leiter, MD [place] None  07/14/2018  8:00 AM [facility] None  07/24/2018 10:45 AM Sloan Leiter, MD [facility] None    Sloan Leiter, MD

## 2018-07-10 NOTE — Progress Notes (Addendum)
ROB.  TDAP given in LD, tolerated well. C/o stomach pain.  Patient vomited up the Glucola fluid.  Rho given in RUOQ, tolerated well.  Administrations This Visit    rho (d) immune globulin (RHIG/RHOPHYLAC) injection 300 mcg    Admin Date 07/10/2018 Action Given Dose 300 mcg Route Intramuscular Administered By Tamela Oddi, RMA

## 2018-07-11 LAB — CBC
Hematocrit: 32.7 % — ABNORMAL LOW (ref 34.0–46.6)
Hemoglobin: 11.2 g/dL (ref 11.1–15.9)
MCH: 32.2 pg (ref 26.6–33.0)
MCHC: 34.3 g/dL (ref 31.5–35.7)
MCV: 94 fL (ref 79–97)
Platelets: 191 10*3/uL (ref 150–450)
RBC: 3.48 x10E6/uL — ABNORMAL LOW (ref 3.77–5.28)
RDW: 13.1 % (ref 11.7–15.4)
WBC: 7.2 10*3/uL (ref 3.4–10.8)

## 2018-07-11 LAB — RPR: RPR Ser Ql: NONREACTIVE

## 2018-07-11 LAB — HIV ANTIBODY (ROUTINE TESTING W REFLEX): HIV Screen 4th Generation wRfx: NONREACTIVE

## 2018-07-12 LAB — URINE CULTURE

## 2018-07-14 ENCOUNTER — Other Ambulatory Visit: Payer: Medicaid Other

## 2018-07-15 ENCOUNTER — Other Ambulatory Visit: Payer: Self-pay

## 2018-07-15 ENCOUNTER — Other Ambulatory Visit: Payer: Medicaid Other

## 2018-07-15 DIAGNOSIS — Z3480 Encounter for supervision of other normal pregnancy, unspecified trimester: Secondary | ICD-10-CM

## 2018-07-16 ENCOUNTER — Encounter: Payer: Self-pay | Admitting: Obstetrics and Gynecology

## 2018-07-16 DIAGNOSIS — Z8632 Personal history of gestational diabetes: Secondary | ICD-10-CM

## 2018-07-16 HISTORY — DX: Personal history of gestational diabetes: Z86.32

## 2018-07-16 LAB — GLUCOSE TOLERANCE, 2 HOURS W/ 1HR
Glucose, 1 hour: 144 mg/dL (ref 65–179)
Glucose, 2 hour: 136 mg/dL (ref 65–152)
Glucose, Fasting: 93 mg/dL — ABNORMAL HIGH (ref 65–91)

## 2018-07-17 ENCOUNTER — Other Ambulatory Visit: Payer: Self-pay

## 2018-07-17 DIAGNOSIS — O24419 Gestational diabetes mellitus in pregnancy, unspecified control: Secondary | ICD-10-CM

## 2018-07-17 MED ORDER — ACCU-CHEK GUIDE VI STRP: ORAL_STRIP | 12 refills | Status: DC

## 2018-07-17 MED ORDER — ACCU-CHEK FASTCLIX LANCETS MISC: 1.0000 | Freq: Four times a day (QID) | 12 refills | Status: DC

## 2018-07-17 MED ORDER — ACCU-CHEK GUIDE W/DEVICE KIT: 1.0000 | PACK | Freq: Four times a day (QID) | 0 refills | Status: DC

## 2018-07-24 ENCOUNTER — Ambulatory Visit (INDEPENDENT_AMBULATORY_CARE_PROVIDER_SITE_OTHER): Payer: Medicaid Other | Admitting: Obstetrics and Gynecology

## 2018-07-24 ENCOUNTER — Encounter: Payer: Self-pay | Admitting: Obstetrics and Gynecology

## 2018-07-24 DIAGNOSIS — Z3480 Encounter for supervision of other normal pregnancy, unspecified trimester: Secondary | ICD-10-CM

## 2018-07-24 DIAGNOSIS — O26893 Other specified pregnancy related conditions, third trimester: Secondary | ICD-10-CM

## 2018-07-24 DIAGNOSIS — O24419 Gestational diabetes mellitus in pregnancy, unspecified control: Secondary | ICD-10-CM

## 2018-07-24 DIAGNOSIS — O360930 Maternal care for other rhesus isoimmunization, third trimester, not applicable or unspecified: Secondary | ICD-10-CM

## 2018-07-24 DIAGNOSIS — R12 Heartburn: Secondary | ICD-10-CM | POA: Diagnosis not present

## 2018-07-24 DIAGNOSIS — Z3A30 30 weeks gestation of pregnancy: Secondary | ICD-10-CM

## 2018-07-24 DIAGNOSIS — Z789 Other specified health status: Secondary | ICD-10-CM | POA: Diagnosis not present

## 2018-07-24 DIAGNOSIS — Z6791 Unspecified blood type, Rh negative: Secondary | ICD-10-CM

## 2018-07-24 MED ORDER — FAMOTIDINE 20 MG PO TABS: 20.0000 mg | ORAL_TABLET | Freq: Every day | ORAL | 2 refills | Status: DC

## 2018-07-24 NOTE — Progress Notes (Signed)
Pt states she is not a home currently and cannot take BP at time of visit.  Pt does not have access to babyRx.  Explained app to pt so she may use or she may call office to give BP readings.  Pt states she is keeping log of blood sugars, doing well.  Please discuss glucose readings and how pt should treat if reading are to be increased.  Pt made aware of making dietary changes to make at this time.    Pt is having back pain and stomach ache.

## 2018-07-24 NOTE — Progress Notes (Signed)
   TELEHEALTH VIRTUAL OBSTETRICS VISIT ENCOUNTER NOTE  I connected with Heather Savage on 07/24/18 at 10:45 AM EDT by telephone at home and verified that I am speaking with the correct person using two identifiers.   I discussed the limitations, risks, security and privacy concerns of performing an evaluation and management service by telephone and the availability of in person appointments. I also discussed with the patient that there may be a patient responsible charge related to this service. The patient expressed understanding and agreed to proceed.  Subjective:  Heather Savage is a 28 y.o. G3P1011 at [redacted]w[redacted]d being followed for ongoing prenatal care.  She is currently monitored for the following issues for this high-risk pregnancy and has Hearing loss; Language barrier; Rh negative status during pregnancy; Supervision of other normal pregnancy, antepartum; E. coli UTI (urinary tract infection); and Gestational diabetes on their problem list.  Patient reports abdominal pain. Complains of stomach pain that she has had throughout the entire pregnancy. Cannot describe it any more than an "ache" she has had throughout the pregnancy. Denies that it is heartburn, reports regular bowel movements, states she does not think it is labor contractions. Reports fetal movement. Denies any contractions, bleeding or leaking of fluid.   The following portions of the patient's history were reviewed and updated as appropriate: allergies, current medications, past family history, past medical history, past social history, past surgical history and problem list.   Objective:   General:  Alert, oriented and cooperative.   Mental Status: Normal mood and affect perceived. Normal judgment and thought content.  Rest of physical exam deferred due to type of encounter  Assessment and Plan:  Pregnancy: G3P1011 at [redacted]w[redacted]d  1. Supervision of other normal pregnancy, antepartum  2. Rh negative status during  pregnancy in third trimester Given at 6/11 visit  3. Language barrier Landscape architect used  4. Gestational diabetes mellitus (GDM) in third trimester, gestational diabetes method of control unspecified Has not seen diabetes education yet, appt scheduled  Checking CBGs at home FG: 99, 102, 103, 115 PP: pt has been doing 1 hr post prandials, 100s-130s Reviewed importance of checking 2 hrs after meals, pt will see diabetes education  5. Abdominal pain - after much discussion, patient requesting medication for "chest burn", after discussion with interpretor, she has heartburn, I reviewed this may be causing the abdominal pain she has had - pepcid sent to pharmacy  Preterm labor symptoms and general obstetric precautions including but not limited to vaginal bleeding, contractions, leaking of fluid and fetal movement were reviewed in detail with the patient.  I discussed the assessment and treatment plan with the patient. The patient was provided an opportunity to ask questions and all were answered. The patient agreed with the plan and demonstrated an understanding of the instructions. The patient was advised to call back or seek an in-person office evaluation/go to MAU at [facility] for any urgent or concerning symptoms. Please refer to After Visit Summary for other counseling recommendations.   I provided 24 minutes of non-face-to-face time during this encounter.  Return in about 2 weeks (around 08/07/2018) for OB visit (MD), virtual.  Future Appointments  Date Time Provider [place]  07/29/2018 11:15 AM Ludlow Roslyn, [place] for [place], [place]

## 2018-07-29 ENCOUNTER — Telehealth: Payer: Self-pay | Admitting: Advanced Practice Midwife

## 2018-07-29 ENCOUNTER — Other Ambulatory Visit: Payer: Medicaid Other

## 2018-07-29 NOTE — Telephone Encounter (Signed)
Called the patient with the in office interrupter. The patient stated she was unaware of the appointment. Rescheduled the appointment and also sending the patient an appointment reminder letter.

## 2018-08-07 ENCOUNTER — Encounter: Payer: Self-pay | Admitting: Family Medicine

## 2018-08-07 ENCOUNTER — Ambulatory Visit (INDEPENDENT_AMBULATORY_CARE_PROVIDER_SITE_OTHER): Payer: Medicaid Other | Admitting: Family Medicine

## 2018-08-07 ENCOUNTER — Other Ambulatory Visit: Payer: Self-pay

## 2018-08-07 VITALS — BP 108/68 | HR 93

## 2018-08-07 DIAGNOSIS — Z6791 Unspecified blood type, Rh negative: Secondary | ICD-10-CM

## 2018-08-07 DIAGNOSIS — O360930 Maternal care for other rhesus isoimmunization, third trimester, not applicable or unspecified: Secondary | ICD-10-CM

## 2018-08-07 DIAGNOSIS — O24410 Gestational diabetes mellitus in pregnancy, diet controlled: Secondary | ICD-10-CM

## 2018-08-07 DIAGNOSIS — Z3480 Encounter for supervision of other normal pregnancy, unspecified trimester: Secondary | ICD-10-CM

## 2018-08-07 DIAGNOSIS — O26893 Other specified pregnancy related conditions, third trimester: Secondary | ICD-10-CM | POA: Diagnosis not present

## 2018-08-07 DIAGNOSIS — Z3A32 32 weeks gestation of pregnancy: Secondary | ICD-10-CM | POA: Diagnosis not present

## 2018-08-07 DIAGNOSIS — Z789 Other specified health status: Secondary | ICD-10-CM

## 2018-08-07 NOTE — Progress Notes (Signed)
I connected with Heather Savage on 08/07/18 at  1:15 PM EDT by telephone and verified that I am speaking with the correct person using two identifiers. Used interper [id number]  Pt c/o low back pain.  CBG readings available fasting least morning 106 last night 122

## 2018-08-07 NOTE — Patient Instructions (Signed)

## 2018-08-07 NOTE — Progress Notes (Signed)
   TELEHEALTH VIRTUAL OBSTETRICS VISIT ENCOUNTER NOTE  I connected with Heather Savage on 08/07/18 at  1:15 PM EDT by telephone at home and verified that I am speaking with the correct person using two identifiers.   I discussed the limitations, risks, security and privacy concerns of performing an evaluation and management service by telephone and the availability of in person appointments. I also discussed with the patient that there may be a patient responsible charge related to this service. The patient expressed understanding and agreed to proceed.  Subjective:  Heather Savage is a 28 y.o. G3P1011 at [redacted]w[redacted]d being followed for ongoing prenatal care.  She is currently monitored for the following issues for this high-risk pregnancy and has Hearing loss; Language barrier; Rh negative status during pregnancy; Supervision of other normal pregnancy, antepartum; E. coli UTI (urinary tract infection); and Gestational diabetes on their problem list.  Patient reports no complaints. Reports fetal movement. Denies any contractions, bleeding or leaking of fluid.   The following portions of the patient's history were reviewed and updated as appropriate: allergies, current medications, past family history, past medical history, past social history, past surgical history and problem list.   Objective:   General:  Alert, oriented and cooperative.   Mental Status: Normal mood and affect perceived. Normal judgment and thought content.  Rest of physical exam deferred due to type of encounter  Assessment and Plan:  Pregnancy: G3P1011 at [redacted]w[redacted]d 1. Diet controlled gestational diabetes mellitus (GDM) in third trimester FBS 103,106, 109, 103, 97, 104, 100, 97, 102, 114, 106 2 hr pp 115, 126, 131, 129, 117, 128, 109, 99, 117, 111, 110, 116, 110, 117, 120, 132, 118, 115, 102, 110, 106, 114, 144, 156, 104, 117, 122 On no meds--add exercise, bedtime snack with protein. Call if CBGs are not better in 1 wk.   2. Language barrier [place] interpreter used  3. Supervision of other normal pregnancy, antepartum Continue prenatal care.   4. Rh negative status during pregnancy in third trimester S/p Rhogam  Preterm labor symptoms and general obstetric precautions including but not limited to vaginal bleeding, contractions, leaking of fluid and fetal movement were reviewed in detail with the patient.  I discussed the assessment and treatment plan with the patient. The patient was provided an opportunity to ask questions and all were answered. The patient agreed with the plan and demonstrated an understanding of the instructions. The patient was advised to call back or seek an in-person office evaluation/go to [facility]U at [facility] for any urgent or concerning symptoms. Please refer to After Visit Summary for other counseling recommendations.   I provided 15 minutes of non-face-to-face time during this encounter.  Return in 2 weeks (on 08/21/2018) for ob visit, virtual, 4 weeks in person.  Future Appointments  Date Time Provider [place]  08/14/2018  9:15 AM Reola Calkins [place]  08/21/2018  1:45 PM Chancy Milroy, MD [place] None    Donnamae Jude, MD Center for [facility], [place]

## 2018-08-14 ENCOUNTER — Encounter: Payer: Medicaid Other | Attending: Obstetrics & Gynecology | Admitting: *Deleted

## 2018-08-14 ENCOUNTER — Other Ambulatory Visit: Payer: Self-pay

## 2018-08-14 ENCOUNTER — Ambulatory Visit: Payer: Medicaid Other | Admitting: *Deleted

## 2018-08-14 DIAGNOSIS — Z3A00 Weeks of gestation of pregnancy not specified: Secondary | ICD-10-CM | POA: Diagnosis not present

## 2018-08-14 DIAGNOSIS — O26613 Liver and biliary tract disorders in pregnancy, third trimester: Secondary | ICD-10-CM | POA: Insufficient documentation

## 2018-08-14 DIAGNOSIS — O24415 Gestational diabetes mellitus in pregnancy, controlled by oral hypoglycemic drugs: Secondary | ICD-10-CM | POA: Diagnosis not present

## 2018-08-14 DIAGNOSIS — K8020 Calculus of gallbladder without cholecystitis without obstruction: Secondary | ICD-10-CM | POA: Diagnosis not present

## 2018-08-14 DIAGNOSIS — O4703 False labor before 37 completed weeks of gestation, third trimester: Secondary | ICD-10-CM | POA: Insufficient documentation

## 2018-08-14 NOTE — Progress Notes (Signed)
  Patient was seen on 08/14/2018 for Gestational Diabetes self-management. EDD 09/28/2018. Patient speaks Amharic, we used live interpretor from [place], [place] for this visit. Patient states no history of GDM and no family history. Diet history obtained. Patient eats fairly variety of all food groups, but hard to determine as many foods are cultural and unfamiliar to me. Beverages include mostly water now.  The following learning objectives were met by the patient :   States the definition of Gestational Diabetes  States why dietary management is important in controlling blood glucose  Describes the effects of carbohydrates on blood glucose levels  Demonstrates ability to create a balanced meal plan  Demonstrates carbohydrate counting   States when to check blood glucose levels  Demonstrates proper blood glucose monitoring techniques  States the effect of stress and exercise on blood glucose levels  Plan:  Aim for 2-3 Carb Choices per meal (30-45 grams)  Aim for 1-2 Carbs per snack Your meals are at 11 AM, a snack at 4-5 PM and dinner at 10 PM. Include a snack of milk with nuts or small piece of bread with cheese before sleep to help bring fasting BG down.  If OK with your MD, consider  increasing your activity level by walking, Arm Chair Exercises or other activity daily as tolerated Begin checking BG before breakfast and 2 hours after first bite of breakfast, lunch and dinner as directed by MD  Bring Log Book/Sheet and meter to every medical appointment  Baby Scripts:  Patient not appropriate for Baby Scripts due to language barrier  Take medication if directed by MD  Patient already has a meter: Accu Chek Guide And is testing pre breakfast and 2 hours each meal as directed by MD Review of Log Book shows: she stopped checking after last MD appointment for a few days, she states her fasting BG this AM was 102 and her post meal numbers have been 119-140 prior to education today  Patient  instructed to monitor glucose levels: FBS: 60 - 95 mg/dl 2 hour: <120 mg/dl  Patient received the following handouts:  Nutrition Diabetes and Pregnancy  Carbohydrate Counting List  BG Log Sheet  Patient will be seen for follow-up as needed.

## 2018-08-21 ENCOUNTER — Encounter: Payer: Self-pay | Admitting: Obstetrics and Gynecology

## 2018-08-21 ENCOUNTER — Ambulatory Visit (INDEPENDENT_AMBULATORY_CARE_PROVIDER_SITE_OTHER): Payer: Medicaid Other | Admitting: Obstetrics and Gynecology

## 2018-08-21 VITALS — BP 107/73 | HR 79

## 2018-08-21 DIAGNOSIS — O24415 Gestational diabetes mellitus in pregnancy, controlled by oral hypoglycemic drugs: Secondary | ICD-10-CM

## 2018-08-21 DIAGNOSIS — Z3480 Encounter for supervision of other normal pregnancy, unspecified trimester: Secondary | ICD-10-CM

## 2018-08-21 DIAGNOSIS — Z3A34 34 weeks gestation of pregnancy: Secondary | ICD-10-CM

## 2018-08-21 MED ORDER — METFORMIN HCL 500 MG PO TABS: 500.0000 mg | ORAL_TABLET | Freq: Two times a day (BID) | ORAL | 5 refills | Status: DC

## 2018-08-21 NOTE — Progress Notes (Signed)
Pt states she thought she was to have another u/s. Pt advised to discuss glucose with provider.

## 2018-08-21 NOTE — Progress Notes (Signed)
   TELEHEALTH OBSTETRICS PRENATAL VIRTUAL VIDEO VISIT ENCOUNTER NOTE  Provider location: Center for [employer] [facility] at [place]   I connected with Heather Savage on 08/21/18 at  1:45 PM EDT by WebEx Encounter at home and verified that I am speaking with the correct person using two identifiers.   I discussed the limitations, risks, security and privacy concerns of performing an evaluation and management service virtually and the availability of in person appointments. I also discussed with the patient that there may be a patient responsible charge related to this service. The patient expressed understanding and agreed to proceed. Subjective:  Heather Savage is a 28 y.o. G3P1011 at [redacted]w[redacted]d being seen today for ongoing prenatal care.  She is currently monitored for the following issues for this high-risk pregnancy and has Hearing loss; Language barrier; Rh negative status during pregnancy; Supervision of other normal pregnancy, antepartum; E. coli UTI (urinary tract infection); and Gestational diabetes on their problem list.  Patient reports no complaints.  Contractions: Not present. Vag. Bleeding: None.  Movement: Present. Denies any leaking of fluid.   The following portions of the patient's history were reviewed and updated as appropriate: allergies, current medications, past family history, past medical history, past social history, past surgical history and problem list.   Objective:   Vitals:   08/21/18 1558  BP: 107/73  Pulse: 79    Fetal Status:     Movement: Present     General:  Alert, oriented and cooperative. Patient is in no acute distress.  Respiratory: Normal respiratory effort, no problems with respiration noted  Mental Status: Normal mood and affect. Normal behavior. Normal judgment and thought content.  Rest of physical exam deferred due to type of encounter  Imaging: No results found.  Assessment and Plan:  Pregnancy: G3P1011 at [redacted]w[redacted]d 1. Supervision of  other normal pregnancy, antepartum Stable  2. Gestational diabetes mellitus (GDM) in third trimester controlled on oral hypoglycemic drug CBG's particularly fasting > 95 Will start Metformin bid - [place] MFM FETAL BPP WO NON STRESS; Future - [place] MFM OB FOLLOW UP; Future  Preterm labor symptoms and general obstetric precautions including but not limited to vaginal bleeding, contractions, leaking of fluid and fetal movement were reviewed in detail with the patient. I discussed the assessment and treatment plan with the patient. The patient was provided an opportunity to ask questions and all were answered. The patient agreed with the plan and demonstrated an understanding of the instructions. The patient was advised to call back or seek an in-person office evaluation/go to MAU at [facility] for any urgent or concerning symptoms. Please refer to After Visit Summary for other counseling recommendations.   I provided 8 minutes of face-to-face time during this encounter.  Return in about 2 weeks (around 09/04/2018) for OB visit, face to face for GBS.  No future appointments.  Chancy Milroy, MD Center for [place], [place]

## 2018-08-24 ENCOUNTER — Encounter (HOSPITAL_COMMUNITY): Payer: Self-pay | Admitting: *Deleted

## 2018-08-24 ENCOUNTER — Inpatient Hospital Stay (HOSPITAL_COMMUNITY)
Admission: AD | Admit: 2018-08-24 | Discharge: 2018-08-24 | Disposition: A | Discharge status: Home / Self Care | Payer: Medicaid Other | Attending: Obstetrics & Gynecology | Admitting: Obstetrics & Gynecology

## 2018-08-24 ENCOUNTER — Other Ambulatory Visit: Payer: Self-pay

## 2018-08-24 DIAGNOSIS — Z3A35 35 weeks gestation of pregnancy: Secondary | ICD-10-CM | POA: Diagnosis not present

## 2018-08-24 DIAGNOSIS — O26893 Other specified pregnancy related conditions, third trimester: Secondary | ICD-10-CM | POA: Insufficient documentation

## 2018-08-24 DIAGNOSIS — M549 Dorsalgia, unspecified: Secondary | ICD-10-CM | POA: Insufficient documentation

## 2018-08-24 DIAGNOSIS — O9989 Other specified diseases and conditions complicating pregnancy, childbirth and the puerperium: Secondary | ICD-10-CM

## 2018-08-24 DIAGNOSIS — O99891 Other specified diseases and conditions complicating pregnancy: Secondary | ICD-10-CM

## 2018-08-24 LAB — URINALYSIS, ROUTINE W REFLEX MICROSCOPIC
Bilirubin Urine: NEGATIVE
Glucose, UA: NEGATIVE mg/dL
Hgb urine dipstick: NEGATIVE
Ketones, ur: NEGATIVE mg/dL
Nitrite: NEGATIVE
Protein, ur: NEGATIVE mg/dL
Specific Gravity, Urine: 1.023 (ref 1.005–1.030)
pH: 6 (ref 5.0–8.0)

## 2018-08-24 MED ORDER — CYCLOBENZAPRINE HCL 10 MG PO TABS: 10.0000 mg | ORAL_TABLET | Freq: Two times a day (BID) | ORAL | 0 refills | Status: DC | PRN

## 2018-08-24 MED ORDER — CYCLOBENZAPRINE HCL 10 MG PO TABS
10.0000 mg | ORAL_TABLET | Freq: Once | ORAL | Status: AC
  Administered 2018-08-24: 10 mg via ORAL
  Filled 2018-08-24: qty 1

## 2018-08-24 NOTE — Discharge Instructions (Signed)

## 2018-08-24 NOTE — MAU Provider Note (Signed)
History     CSN: [id number]  Arrival date and time: 08/24/18 2019   First Provider Initiated Contact with Patient 08/24/18 2116      Chief Complaint  Patient presents with  . Contractions   HPI Heather Savage is a 28 y.o. G3P1011 at 79w1dwho presents with all over back pain that has been ongoing for 3 days. She rates the pain an 8/10 and has not tried anything for the pain. She states the pain is constant. She also reports groin pain that is worse with movement. She denies any leaking or bleeding. Reports more than normal fetal movement.   [facility]  OB History    Gravida  3   Para  1   Term  1   Preterm  0   AB  1   Living  1     SAB  1   TAB  0   Ectopic  0   Multiple      Live Births  1           Past Medical History:  Diagnosis Date  . Anemia    states has low blood count, no current meds.  .Marland KitchenHearing loss    right  . History of gastritis    no current med.  .Marland KitchenHistory of malaria 2012  . Rh negative, maternal   . Tympanic membrane perforation 05/2012   right    Past Surgical History:  Procedure Laterality Date  . EXTERNAL EAR SURGERY    . SCAR REVISION  08/27/2011   Procedure: SCAR REVISION;  Surgeon: GCristine Polio MD;  [facility]  Service: Plastics;  Laterality: N/A;  scar revision of forehead  . TYMPANOPLASTY  05/14/2011   Procedure: TYMPANOPLASTY;  Surgeon: JIzora Gala MD;  [facility]  Service: ENT;  Laterality: Left;  TYMPANOPLASYT AND  POSSIBLE OSSICULARPLASTY   . TYMPANOPLASTY  10/10/2011   Procedure: TYMPANOPLASTY;  Surgeon: JIzora Gala MD;  [facility]  Service: ENT;  Laterality: Right;  . TYMPANOPLASTY Right 06/30/2012   Procedure: REVISION RIGHT TYMPANOPLASTY;  Surgeon: JIzora Gala MD;  [facility]  Service: ENT;  Laterality: Right;    No family history on file.  Social History   Tobacco Use   . Smoking status: Never Smoker  . Smokeless tobacco: Never Used  Substance Use Topics  . Alcohol use: Never    Frequency: Never  . Drug use: Never    Allergies: No Known Allergies  Medications Prior to Admission  Medication Sig Dispense Refill Last Dose  . Accu-Chek FastClix Lancets MISC 1 Stick by Percutaneous route 4 (four) times daily. 100 each 12 Past Week at Unknown time  . Blood Glucose Monitoring Suppl (ACCU-CHEK GUIDE) w/Device KIT 1 Device by Percutaneous route 4 (four) times daily. 1 kit 0 Past Week at Unknown time  . famotidine (PEPCID) 20 MG tablet Take 1 tablet (20 mg total) by mouth daily. 30 tablet 2 08/24/2018 at Unknown time  . glucose blood (ACCU-CHEK GUIDE) test strip Use to check blood sugars four times a day as instructed 50 each 12 Past Week at Unknown time  . Prenat-FeAsp-Meth-FA-DHA w/o A (PRENATE PIXIE) 10-0.6-0.4-200 MG CAPS Take 1 tablet by mouth daily. 30 capsule 12 08/24/2018 at Unknown time  . metFORMIN (GLUCOPHAGE) 500 MG tablet Take 1 tablet (500 mg total) by mouth 2 (two) times daily with a meal. 60 tablet  5   . omeprazole (PRILOSEC) 40 MG capsule Take 1 capsule (40 mg total) by mouth daily. 30 capsule 0   . ondansetron (ZOFRAN ODT) 4 MG disintegrating tablet Take 1 tablet (4 mg total) by mouth every 8 (eight) hours as needed for nausea or vomiting. (Patient not taking: Reported on 05/21/2018) 30 tablet 2     Review of Systems  Constitutional: Negative.  Negative for fatigue and fever.  HENT: Negative.   Respiratory: Negative.  Negative for shortness of breath.   Cardiovascular: Negative.  Negative for chest pain.  Gastrointestinal: Positive for abdominal pain. Negative for constipation, diarrhea, nausea and vomiting.  Genitourinary: Positive for pelvic pain. Negative for dysuria, vaginal bleeding and vaginal discharge.  Neurological: Negative.  Negative for dizziness and headaches.   Physical Exam   Blood pressure (!) 93/57, pulse 87, temperature 97.7  F (36.5 C), temperature source Oral, resp. rate 18, last menstrual period 12/22/2017.  Physical Exam  Nursing note and vitals reviewed. Constitutional: She is oriented to person, place, and time. She appears well-developed and well-nourished. No distress.  HENT:  Head: Normocephalic.  Eyes: Pupils are equal, round, and reactive to light.  Cardiovascular: Normal rate, regular rhythm and normal heart sounds.  Respiratory: Effort normal and breath sounds normal. No respiratory distress.  GI: Soft. Bowel sounds are normal. She exhibits no distension. There is no abdominal tenderness.  Neurological: She is alert and oriented to person, place, and time.  Skin: Skin is warm and dry.  Psychiatric: She has a normal mood and affect. Her behavior is normal. Judgment and thought content normal.   Dilation: Closed Effacement (%): Thick Cervical Position: Posterior Exam by:: Haynes Bast CNM  Fetal Tracing:  Baseline: 130 Variability: moderate Accels: 15x15 Decels: none  Toco: none  MAU Course  Procedures Results for orders placed or performed during the hospital encounter of 08/24/18 (from the past 24 hour(s))  Urinalysis, Routine w reflex microscopic     Status: Abnormal   Collection Time: 08/24/18  8:40 PM  Result Value Ref Range   Color, Urine YELLOW YELLOW   APPearance HAZY (A) CLEAR   Specific Gravity, Urine 1.023 1.005 - 1.030   pH 6.0 5.0 - 8.0   Glucose, UA NEGATIVE NEGATIVE mg/dL   Hgb urine dipstick NEGATIVE NEGATIVE   Bilirubin Urine NEGATIVE NEGATIVE   Ketones, ur NEGATIVE NEGATIVE mg/dL   Protein, ur NEGATIVE NEGATIVE mg/dL   Nitrite NEGATIVE NEGATIVE   Leukocytes,Ua SMALL (A) NEGATIVE   RBC / HPF 0-5 0 - 5 RBC/hpf   WBC, UA 6-10 0 - 5 WBC/hpf   Bacteria, UA RARE (A) NONE SEEN   Squamous Epithelial / LPF 6-10 0 - 5   Mucus PRESENT    MDM UA Flexeril PO  Patient rates pain a 0 after pain medication  Assessment and Plan   1. Back pain affecting pregnancy in  third trimester   2. [redacted] weeks gestation of pregnancy    -Discharge home in stable condition -Rx for limited number of flexeril to have at home in case severe pain returns. Encouraged patient to use pregnancy support belt daily -Preterm labor precautions discussed -Patient advised to follow-up with [place] as scheduled for prenatal care -Patient may return to MAU as needed or if her condition were to change or worsen   [place] 08/24/2018, 10:20 PM

## 2018-08-24 NOTE — MAU Note (Signed)
Pt here with complaints of abdominal pain that radiates into groin and generalized back pain x3 days. Pain feels like contractions every few minutes. Denies LOF or vaginal bleeding. Pt reports that the baby is moving "a lot more than usual", which is concerning to her. Pt denies urinary s/s. Pt speaks Amharic. Ipad being used by another patient. Husband at bedside for translation.

## 2018-08-26 ENCOUNTER — Other Ambulatory Visit: Payer: Self-pay

## 2018-08-26 ENCOUNTER — Inpatient Hospital Stay (HOSPITAL_COMMUNITY)
Admission: AD | Admit: 2018-08-26 | Discharge: 2018-08-27 | DRG: 833 | Disposition: A | Discharge status: Home / Self Care | Payer: Medicaid Other | Attending: Obstetrics and Gynecology | Admitting: Obstetrics and Gynecology

## 2018-08-26 ENCOUNTER — Encounter (HOSPITAL_COMMUNITY): Payer: Self-pay

## 2018-08-26 ENCOUNTER — Inpatient Hospital Stay (HOSPITAL_COMMUNITY): Payer: Medicaid Other

## 2018-08-26 DIAGNOSIS — O26893 Other specified pregnancy related conditions, third trimester: Secondary | ICD-10-CM | POA: Diagnosis present

## 2018-08-26 DIAGNOSIS — Z1159 Encounter for screening for other viral diseases: Secondary | ICD-10-CM

## 2018-08-26 DIAGNOSIS — O24415 Gestational diabetes mellitus in pregnancy, controlled by oral hypoglycemic drugs: Secondary | ICD-10-CM | POA: Diagnosis present

## 2018-08-26 DIAGNOSIS — O479 False labor, unspecified: Secondary | ICD-10-CM | POA: Diagnosis not present

## 2018-08-26 DIAGNOSIS — O99613 Diseases of the digestive system complicating pregnancy, third trimester: Secondary | ICD-10-CM | POA: Diagnosis present

## 2018-08-26 DIAGNOSIS — Z3A35 35 weeks gestation of pregnancy: Secondary | ICD-10-CM

## 2018-08-26 DIAGNOSIS — R1013 Epigastric pain: Secondary | ICD-10-CM

## 2018-08-26 DIAGNOSIS — R52 Pain, unspecified: Secondary | ICD-10-CM | POA: Diagnosis not present

## 2018-08-26 DIAGNOSIS — R1084 Generalized abdominal pain: Secondary | ICD-10-CM | POA: Diagnosis not present

## 2018-08-26 DIAGNOSIS — Z6791 Unspecified blood type, Rh negative: Secondary | ICD-10-CM

## 2018-08-26 DIAGNOSIS — R1111 Vomiting without nausea: Secondary | ICD-10-CM | POA: Diagnosis not present

## 2018-08-26 DIAGNOSIS — K8020 Calculus of gallbladder without cholecystitis without obstruction: Secondary | ICD-10-CM | POA: Diagnosis not present

## 2018-08-26 DIAGNOSIS — B9620 Unspecified Escherichia coli [E. coli] as the cause of diseases classified elsewhere: Secondary | ICD-10-CM

## 2018-08-26 DIAGNOSIS — N390 Urinary tract infection, site not specified: Secondary | ICD-10-CM

## 2018-08-26 DIAGNOSIS — R1010 Upper abdominal pain, unspecified: Secondary | ICD-10-CM | POA: Diagnosis present

## 2018-08-26 DIAGNOSIS — R110 Nausea: Secondary | ICD-10-CM | POA: Diagnosis not present

## 2018-08-26 DIAGNOSIS — R7989 Other specified abnormal findings of blood chemistry: Secondary | ICD-10-CM

## 2018-08-26 LAB — COMPREHENSIVE METABOLIC PANEL
ALT: 138 U/L — ABNORMAL HIGH (ref 0–44)
AST: 222 U/L — ABNORMAL HIGH (ref 15–41)
Albumin: 2.9 g/dL — ABNORMAL LOW (ref 3.5–5.0)
Alkaline Phosphatase: 151 U/L — ABNORMAL HIGH (ref 38–126)
Anion gap: 12 (ref 5–15)
BUN: 6 mg/dL (ref 6–20)
CO2: 19 mmol/L — ABNORMAL LOW (ref 22–32)
Calcium: 8.9 mg/dL (ref 8.9–10.3)
Chloride: 105 mmol/L (ref 98–111)
Creatinine, Ser: 0.49 mg/dL (ref 0.44–1.00)
GFR calc Af Amer: >60 mL/min (ref >60)
GFR calc non Af Amer: >60 mL/min (ref >60)
Glucose, Bld: 113 mg/dL — ABNORMAL HIGH (ref 70–99)
Potassium: 3.9 mmol/L (ref 3.5–5.1)
Sodium: 136 mmol/L (ref 135–145)
Total Bilirubin: 1.2 mg/dL (ref 0.3–1.2)
Total Protein: 6.1 g/dL — ABNORMAL LOW (ref 6.5–8.1)

## 2018-08-26 LAB — CBC WITH DIFFERENTIAL/PLATELET
Abs Immature Granulocytes: 0.04 10*3/uL (ref 0.00–0.07)
Basophils Absolute: 0 10*3/uL (ref 0.0–0.1)
Basophils Relative: 0 %
Eosinophils Absolute: 0 10*3/uL (ref 0.0–0.5)
Eosinophils Relative: 0 %
HCT: 35.8 % — ABNORMAL LOW (ref 36.0–46.0)
Hemoglobin: 12.1 g/dL (ref 12.0–15.0)
Immature Granulocytes: 1 %
Lymphocytes Relative: 16 %
Lymphs Abs: 1.2 10*3/uL (ref 0.7–4.0)
MCH: 30.6 pg (ref 26.0–34.0)
MCHC: 33.8 g/dL (ref 30.0–36.0)
MCV: 90.4 fL (ref 80.0–100.0)
Monocytes Absolute: 0.4 10*3/uL (ref 0.1–1.0)
Monocytes Relative: 5 %
Neutro Abs: 5.9 10*3/uL (ref 1.7–7.7)
Neutrophils Relative %: 78 %
Platelets: 198 10*3/uL (ref 150–400)
RBC: 3.96 MIL/uL (ref 3.87–5.11)
RDW: 13.1 % (ref 11.5–15.5)
WBC: 7.5 10*3/uL (ref 4.0–10.5)
nRBC: 0 % (ref 0.0–0.2)

## 2018-08-26 LAB — URINALYSIS, ROUTINE W REFLEX MICROSCOPIC
Bacteria, UA: NONE SEEN
Glucose, UA: NEGATIVE mg/dL
Hgb urine dipstick: NEGATIVE
Ketones, ur: 20 mg/dL — AB
Leukocytes,Ua: NEGATIVE
Nitrite: NEGATIVE
Protein, ur: 30 mg/dL — AB
Specific Gravity, Urine: 1.025 (ref 1.005–1.030)
pH: 6 (ref 5.0–8.0)

## 2018-08-26 LAB — CBC
HCT: 35.3 % — ABNORMAL LOW (ref 36.0–46.0)
Hemoglobin: 11.8 g/dL — ABNORMAL LOW (ref 12.0–15.0)
MCH: 31.1 pg (ref 26.0–34.0)
MCHC: 33.4 g/dL (ref 30.0–36.0)
MCV: 92.9 fL (ref 80.0–100.0)
Platelets: 193 10*3/uL (ref 150–400)
RBC: 3.8 MIL/uL — ABNORMAL LOW (ref 3.87–5.11)
RDW: 13.1 % (ref 11.5–15.5)
WBC: 6.6 10*3/uL (ref 4.0–10.5)
nRBC: 0 % (ref 0.0–0.2)

## 2018-08-26 LAB — GLUCOSE, CAPILLARY
Glucose-Capillary: 82 mg/dL (ref 70–99)
Glucose-Capillary: 174 mg/dL — ABNORMAL HIGH (ref 70–99)

## 2018-08-26 LAB — HEPATITIS B SURFACE ANTIGEN: Hepatitis B Surface Ag: NEGATIVE

## 2018-08-26 LAB — PROTEIN / CREATININE RATIO, URINE
Creatinine, Urine: 173.39 mg/dL
Protein Creatinine Ratio: 0.13 mg/mg{Cre} (ref 0.00–0.15)
Total Protein, Urine: 23 mg/dL

## 2018-08-26 LAB — LIPASE, BLOOD: Lipase: 21 U/L (ref 11–51)

## 2018-08-26 LAB — SARS CORONAVIRUS 2 BY RT PCR (HOSPITAL ORDER, PERFORMED IN ~~LOC~~ HOSPITAL LAB): SARS Coronavirus 2: NEGATIVE

## 2018-08-26 MED ORDER — ACETAMINOPHEN 325 MG PO TABS: 650.0000 mg | ORAL_TABLET | ORAL | Status: DC | PRN

## 2018-08-26 MED ORDER — PRENATAL MULTIVITAMIN CH
1.0000 | ORAL_TABLET | Freq: Every day | ORAL | Status: DC
  Administered 2018-08-27: 1 via ORAL
  Filled 2018-08-26: qty 1

## 2018-08-26 MED ORDER — LIDOCAINE VISCOUS HCL 2 % MT SOLN
15.0000 mL | Freq: Once | OROMUCOSAL | Status: AC
  Administered 2018-08-26: 15 mL via ORAL
  Filled 2018-08-26: qty 15

## 2018-08-26 MED ORDER — ZOLPIDEM TARTRATE 5 MG PO TABS: 5.0000 mg | ORAL_TABLET | Freq: Every evening | ORAL | Status: DC | PRN

## 2018-08-26 MED ORDER — PROMETHAZINE HCL 25 MG/ML IJ SOLN
25.0000 mg | Freq: Once | INTRAMUSCULAR | Status: AC
  Administered 2018-08-26: 06:00:00 25 mg via INTRAVENOUS
  Filled 2018-08-26: qty 1

## 2018-08-26 MED ORDER — LACTATED RINGERS IV SOLN: 500.0000 mL | INTRAVENOUS | Status: DC | PRN

## 2018-08-26 MED ORDER — LACTATED RINGERS IV SOLN
INTRAVENOUS | Status: DC
  Administered 2018-08-26 (×2): via INTRAVENOUS

## 2018-08-26 MED ORDER — OXYTOCIN BOLUS FROM INFUSION: 500.0000 mL | Freq: Once | INTRAVENOUS | Status: DC

## 2018-08-26 MED ORDER — PENICILLIN G 3 MILLION UNITS IVPB - SIMPLE MED
3.0000 10*6.[IU] | INTRAVENOUS | Status: DC
  Filled 2018-08-26 (×2): qty 100

## 2018-08-26 MED ORDER — LIDOCAINE HCL (PF) 1 % IJ SOLN: 30.0000 mL | INTRAMUSCULAR | Status: DC | PRN

## 2018-08-26 MED ORDER — FENTANYL CITRATE (PF) 100 MCG/2ML IJ SOLN: 100.0000 ug | INTRAMUSCULAR | Status: DC | PRN

## 2018-08-26 MED ORDER — SOD CITRATE-CITRIC ACID 500-334 MG/5ML PO SOLN: 30.0000 mL | ORAL | Status: DC | PRN

## 2018-08-26 MED ORDER — ONDANSETRON HCL 4 MG/2ML IJ SOLN: 4.0000 mg | Freq: Four times a day (QID) | INTRAMUSCULAR | Status: DC | PRN

## 2018-08-26 MED ORDER — DOCUSATE SODIUM 100 MG PO CAPS
100.0000 mg | ORAL_CAPSULE | Freq: Every day | ORAL | Status: DC
  Administered 2018-08-26 – 2018-08-27 (×2): 100 mg via ORAL
  Filled 2018-08-26 (×2): qty 1

## 2018-08-26 MED ORDER — BETAMETHASONE SOD PHOS & ACET 6 (3-3) MG/ML IJ SUSP
12.0000 mg | INTRAMUSCULAR | Status: AC
  Administered 2018-08-26 – 2018-08-27 (×2): 12 mg via INTRAMUSCULAR
  Filled 2018-08-26 (×3): qty 2

## 2018-08-26 MED ORDER — SODIUM CHLORIDE 0.9 % IV SOLN
5.0000 10*6.[IU] | Freq: Once | INTRAVENOUS | Status: AC
  Administered 2018-08-26: 5 10*6.[IU] via INTRAVENOUS
  Filled 2018-08-26: qty 5

## 2018-08-26 MED ORDER — CALCIUM CARBONATE ANTACID 500 MG PO CHEW
2.0000 | CHEWABLE_TABLET | ORAL | Status: DC | PRN
  Administered 2018-08-27: 400 mg via ORAL
  Filled 2018-08-26: qty 2

## 2018-08-26 MED ORDER — OXYCODONE-ACETAMINOPHEN 5-325 MG PO TABS: 2.0000 | ORAL_TABLET | ORAL | Status: DC | PRN

## 2018-08-26 MED ORDER — LACTATED RINGERS IV BOLUS
1000.0000 mL | Freq: Once | INTRAVENOUS | Status: AC
  Administered 2018-08-26: 06:00:00 1000 mL via INTRAVENOUS

## 2018-08-26 MED ORDER — METFORMIN HCL 500 MG PO TABS
500.0000 mg | ORAL_TABLET | Freq: Two times a day (BID) | ORAL | Status: DC
  Administered 2018-08-26 – 2018-08-27 (×2): 500 mg via ORAL
  Filled 2018-08-26 (×4): qty 1

## 2018-08-26 MED ORDER — ALUM & MAG HYDROXIDE-SIMETH 200-200-20 MG/5ML PO SUSP
30.0000 mL | Freq: Once | ORAL | Status: AC
  Administered 2018-08-26: 30 mL via ORAL
  Filled 2018-08-26: qty 30

## 2018-08-26 MED ORDER — OXYTOCIN 40 UNITS IN NORMAL SALINE INFUSION - SIMPLE MED: 2.5000 [IU]/h | INTRAVENOUS | Status: DC

## 2018-08-26 MED ORDER — OXYCODONE-ACETAMINOPHEN 5-325 MG PO TABS: 1.0000 | ORAL_TABLET | ORAL | Status: DC | PRN

## 2018-08-26 NOTE — MAU Note (Signed)
Pt reports nausea and vomiting has subsided. She still feels mild contractions

## 2018-08-26 NOTE — MAU Note (Addendum)
Patient presents to MAU via EMS c/o abdominal pain, n/v with symptoms worsening.  +FM, denies LOF or vaginal bleeding.   Patient received 4 mg Zofran given by EMS. 20g IV Left AC by EMS

## 2018-08-26 NOTE — H&P (Addendum)
OBSTETRIC ADMISSION HISTORY AND PHYSICAL  Heather Savage is a 28 y.o. female G3P1011 with IUP at 24w2dby LMP presents to [facility] for epigastric diffuse upper abdominal pain starting yesterday (7/27) around 2pm. Pain was initially 9/10 in severity, and has since subsided, pt is not currently in any pain. Eating does not change pain. She does also endorse reflux symptoms during this time. No N/V/D/C. No urinary symptoms.  Reports fetal movement. Denies vaginal bleeding. No LOF. Denies ctx.  [facility]   Support person in labor: husband at bedside  Ultrasounds . Anatomy U/S: 05/16/18  Impression  Normal interval growth.  No ultrasonic evidence of structural  fetal anomalies. ---------------------------------------------------------------------- Recommendations  Follow up as clinically indicated. ----------------------------------------------------------------------  Prenatal History/Complications: Rh negative (Rhogam 07/10/2018) E coli UTI in pregnancy (neg TOC) Gestational DM (A2 on metformin)  Past Medical History: Past Medical History:  Diagnosis Date  . Anemia    states has low blood count, no current meds.  .Marland KitchenHearing loss    right  . History of gastritis    no current med.  .Marland KitchenHistory of malaria 2012  . Preterm labor   . Rh negative, maternal   . Tympanic membrane perforation 05/2012   right    Past Surgical History: Past Surgical History:  Procedure Laterality Date  . EXTERNAL EAR SURGERY    . SCAR REVISION  08/27/2011   Procedure: SCAR REVISION;  Surgeon: GCristine Polio MD;  [place]  Service: Plastics;  Laterality: N/A;  scar revision of forehead  . TYMPANOPLASTY  05/14/2011   Procedure: TYMPANOPLASTY;  Surgeon: JIzora Gala MD;  [place] [facility]  Service: ENT;  Laterality: Left;  TYMPANOPLASYT AND  POSSIBLE OSSICULARPLASTY   . TYMPANOPLASTY  10/10/2011   Procedure: TYMPANOPLASTY;   Surgeon: JIzora Gala MD;  [place] [facility]  Service: ENT;  Laterality: Right;  . TYMPANOPLASTY Right 06/30/2012   Procedure: REVISION RIGHT TYMPANOPLASTY;  Surgeon: JIzora Gala MD;  [place] [facility]  Service: ENT;  Laterality: Right;    Obstetrical History: OB History    Gravida  3   Para  1   Term  1   Preterm  0   AB  1   Living  1     SAB  1   TAB  0   Ectopic  0   Multiple      Live Births  1           Social History: Social History   Socioeconomic History  . Marital status: Single    Spouse name: Not on file  . Number of children: 1  . Years of education: Not on file  . Highest education level: Not on file  Occupational History  . Not on file  Social Needs  . Financial resource strain: Not on file  . Food insecurity    Worry: Not on file    Inability: Not on file  . Transportation needs    Medical: Not on file    Non-medical: Not on file  Tobacco Use  . Smoking status: Never Smoker  . Smokeless tobacco: Never Used  Substance and Sexual Activity  . Alcohol use: Never    Frequency: Never  . Drug use: Never  . Sexual activity: Yes    Birth control/protection: None  Lifestyle  . Physical activity    Days per week: 0 days    Minutes per session: 0 min  .  Stress: Not at all  Relationships  . Social Herbalist on phone: Not on file    Gets together: Not on file    Attends religious service: Not on file    Active member of club or organization: Not on file    Attends meetings of clubs or organizations: Not on file    Relationship status: Not on file  Other Topics Concern  . Not on file  Social History Narrative   Immigrated from [place] in March 15, 2011.  [place] family background.  Planning on becoming permanent resident.  Lives with boyfriend.  [place] living [place]- no other family in [place].  [employer]    Family History: No family history on file.  Allergies: No Known Allergies  Medications  Prior to Admission  Medication Sig Dispense Refill Last Dose  . famotidine (PEPCID) 20 MG tablet Take 1 tablet (20 mg total) by mouth daily. 30 tablet 2 08/25/2018 at Unknown time  . metFORMIN (GLUCOPHAGE) 500 MG tablet Take 1 tablet (500 mg total) by mouth 2 (two) times daily with a meal. 60 tablet 5 08/25/2018 at Unknown time  . omeprazole (PRILOSEC) 40 MG capsule Take 1 capsule (40 mg total) by mouth daily. 30 capsule 0 08/25/2018 at Unknown time  . Prenat-FeAsp-Meth-FA-DHA w/o A (PRENATE PIXIE) 10-0.6-0.4-200 MG CAPS Take 1 tablet by mouth daily. 30 capsule 12 Past Week at Unknown time  . Accu-Chek FastClix Lancets MISC 1 Stick by Percutaneous route 4 (four) times daily. 100 each 12   . Blood Glucose Monitoring Suppl (ACCU-CHEK GUIDE) w/Device KIT 1 Device by Percutaneous route 4 (four) times daily. 1 kit 0   . cyclobenzaprine (FLEXERIL) 10 MG tablet Take 1 tablet (10 mg total) by mouth 2 (two) times daily as needed for muscle spasms. 10 tablet 0   . glucose blood (ACCU-CHEK GUIDE) test strip Use to check blood sugars four times a day as instructed 50 each 12   . ondansetron (ZOFRAN ODT) 4 MG disintegrating tablet Take 1 tablet (4 mg total) by mouth every 8 (eight) hours as needed for nausea or vomiting. (Patient not taking: Reported on 05/21/2018) 30 tablet 2      Review of Systems  All systems reviewed and negative except as stated in HPI  Blood pressure 116/68, pulse 79, temperature 98.4 F (36.9 C), temperature source Oral, resp. rate 16, height 5' 3"  (1.6 m), weight 70.3 kg, last menstrual period 12/22/2017, SpO2 (!) 0 %. General appearance: alert, cooperative and no distress Lungs: no respiratory distress Heart: regular rate  Abdomen: soft, non-tender; gravid b Pelvic: deferred Extremities: Homans sign is negative, no sign of DVT Presentation: deferred, vertex per MAU RN on subsequent cervical exams  Fetal monitoring: cat II Uterine activity: quiet Dilation: 5 Effacement (%):  50 Station: -3 Exam by:: N Nugent NP  Prenatal labs: ABO, Rh: --/--/O NEG (07/28 1258) Antibody: POS (07/28 1258) Rubella: 8.22 (02/19 1535) RPR: Non Reactive (06/11 0914)  HBsAg: Negative (07/28 3235)  HIV: Non Reactive (06/11 0914)  GBS:   pending Glucola: failed, fasting BGL 93 Genetic screening:  negative  Prenatal Transfer Tool  Maternal Diabetes: Yes:  Diabetes Type:  Insulin/Medication controlled Genetic Screening: Normal Maternal Ultrasounds/Referrals: Normal Fetal Ultrasounds or other Referrals:  None Maternal Substance Abuse:  No Significant Maternal Medications:  Meds include: Other: metformin 500 mg BID Significant Maternal Lab Results: Rh negative and Other: GBS unknown  Results for orders placed or performed during the hospital encounter of 08/26/18 (  from the past 24 hour(s))  Urinalysis, Routine w reflex microscopic   Collection Time: 08/26/18  5:18 AM  Result Value Ref Range   Color, Urine AMBER (A) YELLOW   APPearance CLEAR CLEAR   Specific Gravity, Urine 1.025 1.005 - 1.030   pH 6.0 5.0 - 8.0   Glucose, UA NEGATIVE NEGATIVE mg/dL   Hgb urine dipstick NEGATIVE NEGATIVE   Bilirubin Urine SMALL (A) NEGATIVE   Ketones, ur 20 (A) NEGATIVE mg/dL   Protein, ur 30 (A) NEGATIVE mg/dL   Nitrite NEGATIVE NEGATIVE   Leukocytes,Ua NEGATIVE NEGATIVE   RBC / HPF 0-5 0 - 5 RBC/hpf   WBC, UA 0-5 0 - 5 WBC/hpf   Bacteria, UA NONE SEEN NONE SEEN   Squamous Epithelial / LPF 0-5 0 - 5   Mucus PRESENT   CBC with Differential/Platelet   Collection Time: 08/26/18  5:57 AM  Result Value Ref Range   WBC 7.5 4.0 - 10.5 K/uL   RBC 3.96 3.87 - 5.11 MIL/uL   Hemoglobin 12.1 12.0 - 15.0 g/dL   HCT 35.8 (L) 36.0 - 46.0 %   MCV 90.4 80.0 - 100.0 fL   MCH 30.6 26.0 - 34.0 pg   MCHC 33.8 30.0 - 36.0 g/dL   RDW 13.1 11.5 - 15.5 %   Platelets 198 150 - 400 K/uL   nRBC 0.0 0.0 - 0.2 %   Neutrophils Relative % 78 %   Neutro Abs 5.9 1.7 - 7.7 K/uL   Lymphocytes Relative 16 %    Lymphs Abs 1.2 0.7 - 4.0 K/uL   Monocytes Relative 5 %   Monocytes Absolute 0.4 0.1 - 1.0 K/uL   Eosinophils Relative 0 %   Eosinophils Absolute 0.0 0.0 - 0.5 K/uL   Basophils Relative 0 %   Basophils Absolute 0.0 0.0 - 0.1 K/uL   Immature Granulocytes 1 %   Abs Immature Granulocytes 0.04 0.00 - 0.07 K/uL  Comprehensive metabolic panel   Collection Time: 08/26/18  5:57 AM  Result Value Ref Range   Sodium 136 135 - 145 mmol/L   Potassium 3.9 3.5 - 5.1 mmol/L   Chloride 105 98 - 111 mmol/L   CO2 19 (L) 22 - 32 mmol/L   Glucose, Bld 113 (H) 70 - 99 mg/dL   BUN 6 6 - 20 mg/dL   Creatinine, Ser 0.49 0.44 - 1.00 mg/dL   Calcium 8.9 8.9 - 10.3 mg/dL   Total Protein 6.1 (L) 6.5 - 8.1 g/dL   Albumin 2.9 (L) 3.5 - 5.0 g/dL   AST 222 (H) 15 - 41 U/L   ALT 138 (H) 0 - 44 U/L   Alkaline Phosphatase 151 (H) 38 - 126 U/L   Total Bilirubin 1.2 0.3 - 1.2 mg/dL   GFR calc non Af Amer >60 >60 mL/min   GFR calc Af Amer >60 >60 mL/min   Anion gap 12 5 - 15  Lipase, blood   Collection Time: 08/26/18  5:57 AM  Result Value Ref Range   Lipase 21 11 - 51 U/L  Protein / creatinine ratio, urine   Collection Time: 08/26/18  6:44 AM  Result Value Ref Range   Creatinine, Urine 173.39 mg/dL   Total Protein, Urine 23 mg/dL   Protein Creatinine Ratio 0.13 0.00 - 0.15 mg/mg[Cre]  Hepatitis B surface antigen   Collection Time: 08/26/18  8:33 AM  Result Value Ref Range   Hepatitis B Surface Ag Negative Negative  SARS Coronavirus 2 (CEPHEID - Performed in  [facility] hospital lab), Hosp Order   Collection Time: 08/26/18 12:13 PM   Specimen: Nasopharyngeal Swab  Result Value Ref Range   SARS Coronavirus 2 NEGATIVE NEGATIVE  CBC   Collection Time: 08/26/18 12:18 PM  Result Value Ref Range   WBC 6.6 4.0 - 10.5 K/uL   RBC 3.80 (L) 3.87 - 5.11 MIL/uL   Hemoglobin 11.8 (L) 12.0 - 15.0 g/dL   HCT 35.3 (L) 36.0 - 46.0 %   MCV 92.9 80.0 - 100.0 fL   MCH 31.1 26.0 - 34.0 pg   MCHC 33.4 30.0 - 36.0 g/dL    RDW 13.1 11.5 - 15.5 %   Platelets 193 150 - 400 K/uL   nRBC 0.0 0.0 - 0.2 %  Type and screen [place]   Collection Time: 08/26/18 12:58 PM  Result Value Ref Range   ABO/RH(D) O NEG    Antibody Screen POS    Sample Expiration 08/29/2018,2359    Antibody Identification PASSIVELY ACQUIRED ANTI-D    Unit Number [id number]    Blood Component Type RED CELLS,LR    Unit division 00    Status of Unit ALLOCATED    Transfusion Status OK TO TRANSFUSE    Crossmatch Result COMPATIBLE    Unit Number [id number]    Blood Component Type RED CELLS,LR    Unit division 00    Status of Unit ALLOCATED    Transfusion Status OK TO TRANSFUSE    Crossmatch Result COMPATIBLE   BPAM RBC   Collection Time: 08/26/18 12:58 PM  Result Value Ref Range   Blood Product Unit Number [id number]    PRODUCT CODE O1607P71    Unit Type and Rh 9500    Blood Product Expiration Date 062694854627    Blood Product Unit Number [id number]    PRODUCT CODE [id number]    Unit Type and Rh 9500    Blood Product Expiration Date 938101751025   Glucose, capillary   Collection Time: 08/26/18  2:02 PM  Result Value Ref Range   Glucose-Capillary 82 70 - 99 mg/dL    Patient Active Problem List   Diagnosis Date Noted  . Preterm labor 08/26/2018  . Gestational diabetes 07/16/2018  . E. coli UTI (urinary tract infection) 03/26/2018  . Supervision of other normal pregnancy, antepartum 03/19/2018  . Rh negative status during pregnancy 02/08/2016  . Language barrier 01/02/2016  . Hearing loss 05/01/2011    Assessment/Plan:  CHANCY CLAROS is a 28 y.o. G3P1011 at 34w2dby LMP here for   R/O PTL:  --Cervical exam: 3cm>>5cm in [facility]U, no LOF, no ctx --Patient endorsing pain/pressure initially, no longer having sxs --toco quiet, FHT cat II --betamethasone x1 administered (1504 on 7/28), will repeat in 24 hours -- pain control: currently denies pain  Rh negative (Rhogam 07/10/2018) --Rhogam  postpartum  E coli UTI in pregnancy (neg TOC) --UA 7/28 on admission unremarkable  Gestational DM (A2) --abnormal fasting BGL on 2hr GTT (93) --just diagnosed and started on metformin 3 days ago, has only taken 1 dose --ordered metformin 500 mg BID  Fetal Wellbeing: EFW 363g (43%ile)  --GBS (unknown)- pending --PCN ordered given preterm and unk GBS status --continuous fetal monitoring - cat II   Postpartum Planning -- breast/bottle -- REN/[2.77][id number] ACorliss Blacker PGY III Family Medicine   OB FELLOW HISTORY AND PHYSICAL ATTESTATION  I have seen and examined this patient; I agree with above documentation in the resident's note.   CPhill Myron D.O. OB Fellow  08/26/2018, 5:40 PM

## 2018-08-26 NOTE — MAU Provider Note (Addendum)
Chief Complaint:  Abdominal Pain, Nausea, and Emesis   First Provider Initiated Contact with Patient 08/26/18 0534     *Amharic phone interpreter used for this visit*  HPI: Heather Savage is a 28 y.o. G3P1011 at 30w2dwho presents to maternity admissions via EMS reporting n/v & abdominal pain.  Reports epigastric pain that started a 2 pm yesterday. Pain is constant & radiates to her mid back. Can't describe pain. Nothing makes better or worse. Rates pain 8/10. Pain accompanied by n/v that started at 10 pm. States she's vomited 10+ times. Was given zofran via IV on the EMS truck & reports continued nausea.  Also reports lower abdominal pain that started a few days ago. Pain is intermittent & occurs every 15-30 minutes.  Denies LOF, dysuria, hematuria, fever/chills, vaginal bleeding. Good fetal movement.     Past Medical History:  Diagnosis Date  . Anemia    states has low blood count, no current meds.  .Marland KitchenHearing loss    right  . History of gastritis    no current med.  .Marland KitchenHistory of malaria 2012  . Preterm labor   . Rh negative, maternal   . 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 EXTERNAL EAR SURGERY    . SCAR REVISION  08/27/2011   Procedure: SCAR REVISION;  Surgeon: GeCristine PolioMD;  [place] [facility] [facility];  Laterality: N/A;  scar revision of forehead  . TYMPANOPLASTY  05/14/2011   Procedure: TYMPANOPLASTY;  Surgeon: JeIzora GalaMD;  [place] [facility] [facility];  Laterality: Left;  TYMPANOPLASYT AND  POSSIBLE OSSICULARPLASTY   . TYMPANOPLASTY   10/10/2011   Procedure: TYMPANOPLASTY;  Surgeon: JeIzora GalaMD;  [place] [facility];  Laterality: Right;  . TYMPANOPLASTY Right 06/30/2012   Procedure: REVISION RIGHT TYMPANOPLASTY;  Surgeon: JeIzora GalaMD;  [place] [facility] [facility];  Laterality: Right;   No family history on file. Social History   Tobacco Use  . Smoking status: Never Smoker  . Smokeless tobacco: Never Used  Substance Use Topics  . Alcohol use: Never    Frequency: Never  . Drug use: Never   No Known Allergies Medications Prior to Admission  Medication Sig Dispense Refill Last Dose  . famotidine (PEPCID) 20 MG tablet Take 1 tablet (20 mg total) by mouth daily. 30 tablet 2 08/25/2018 at Unknown time  . metFORMIN (GLUCOPHAGE) 500 MG tablet Take 1 tablet (500 mg total) by mouth 2 (two) times daily with a meal. 60 tablet 5 08/25/2018 at Unknown time  . omeprazole (PRILOSEC) 40 MG capsule Take 1 capsule (40 mg total) by mouth daily. 30 capsule 0 08/25/2018 at Unknown time  . Prenat-FeAsp-Meth-FA-DHA w/o A (PRENATE PIXIE) 10-0.6-0.4-200 MG CAPS Take 1 tablet by mouth daily. 30 capsule 12 Past Week at Unknown time  . Accu-Chek FastClix Lancets MISC  1 Stick by Percutaneous route 4 (four) times daily. 100 each 12   . Blood Glucose Monitoring Suppl (ACCU-CHEK GUIDE) w/Device KIT 1 Device by Percutaneous route 4 (four) times daily. 1 kit 0   . cyclobenzaprine (FLEXERIL) 10 MG tablet Take 1 tablet (10 mg total) by mouth 2 (two) times daily as needed for muscle spasms. 10 tablet 0   . glucose blood (ACCU-CHEK GUIDE) test strip Use to check blood sugars four times a day as instructed 50 each 12   . ondansetron (ZOFRAN ODT) 4 MG disintegrating tablet Take 1 tablet (4 mg total) by mouth every 8 (eight) hours as needed for nausea or vomiting. (Patient not taking: Reported on 05/21/2018) 30 tablet 2     I have reviewed patient's Past Medical Hx, Surgical Hx, Family Hx, Social Hx, medications and  allergies.   ROS:  Review of Systems  Constitutional: Negative.   Gastrointestinal: Positive for abdominal pain, nausea and vomiting. Negative for constipation and diarrhea.  Genitourinary: Negative.   Musculoskeletal: Positive for back pain.    Physical Exam   Patient Vitals for the past 24 hrs:  BP Temp Temp src Pulse Resp SpO2 Height Weight  08/26/18 1353 116/68 98.4 F (36.9 C) Oral 79 16 - - -  08/26/18 1300 - - - - - - 5' 3"  (1.6 m) 154 lb 15.7 oz (70.3 kg)  08/26/18 1142 (!) 95/52 98.1 F (36.7 C) Oral 94 - (!) 0 % - -  08/26/18 0901 106/65 - - 81 - - - -  08/26/18 0859 112/68 - - 69 - - - -  08/26/18 0857 117/64 - - 62 - - - -  08/26/18 0546 111/71 - - 80 - - - -  08/26/18 0529 106/65 97.7 F (36.5 C) - 75 18 - 5' 3"  (1.6 m) -    Constitutional: Well-developed, well-nourished female in no acute distress.  Cardiovascular: normal rate & rhythm, no murmur Respiratory: normal effort, lung sounds clear throughout GI: Abd soft, TTP throughout epigastric area, negative murphy's sign.  gravid appropriate for gestational age. Pos BS x 4. No CVAT. MS: Extremities nontender, no edema, normal ROM Neurologic: Alert and oriented x 4.  GU:      Dilation: 5 Effacement (%): 50 Cervical Position: Posterior Station: -3 Exam by:: N Nugent NP  NST:  Baseline: 135 bpm, Variability: Good {> 6 bpm), Accelerations: Reactive, Decelerations: Absent and ctx Q4-7 minutes   Labs: Results for orders placed or performed during the hospital encounter of 08/26/18 (from the past 24 hour(s))  Urinalysis, Routine w reflex microscopic     Status: Abnormal   Collection Time: 08/26/18  5:18 AM  Result Value Ref Range   Color, Urine AMBER (A) YELLOW   APPearance CLEAR CLEAR   Specific Gravity, Urine 1.025 1.005 - 1.030   pH 6.0 5.0 - 8.0   Glucose, UA NEGATIVE NEGATIVE mg/dL   Hgb urine dipstick NEGATIVE NEGATIVE   Bilirubin Urine SMALL (A) NEGATIVE   Ketones, ur 20 (A) NEGATIVE mg/dL    Protein, ur 30 (A) NEGATIVE mg/dL   Nitrite NEGATIVE NEGATIVE   Leukocytes,Ua NEGATIVE NEGATIVE   RBC / HPF 0-5 0 - 5 RBC/hpf   WBC, UA 0-5 0 - 5 WBC/hpf   Bacteria, UA NONE SEEN NONE SEEN   Squamous Epithelial / LPF 0-5 0 - 5   Mucus PRESENT   CBC with Differential/Platelet     Status: Abnormal   Collection Time: 08/26/18  5:57 AM  Result Value  Ref Range   WBC 7.5 4.0 - 10.5 K/uL   RBC 3.96 3.87 - 5.11 MIL/uL   Hemoglobin 12.1 12.0 - 15.0 g/dL   HCT 35.8 (L) 36.0 - 46.0 %   MCV 90.4 80.0 - 100.0 fL   MCH 30.6 26.0 - 34.0 pg   MCHC 33.8 30.0 - 36.0 g/dL   RDW 13.1 11.5 - 15.5 %   Platelets 198 150 - 400 K/uL   nRBC 0.0 0.0 - 0.2 %   Neutrophils Relative % 78 %   Neutro Abs 5.9 1.7 - 7.7 K/uL   Lymphocytes Relative 16 %   Lymphs Abs 1.2 0.7 - 4.0 K/uL   Monocytes Relative 5 %   Monocytes Absolute 0.4 0.1 - 1.0 K/uL   Eosinophils Relative 0 %   Eosinophils Absolute 0.0 0.0 - 0.5 K/uL   Basophils Relative 0 %   Basophils Absolute 0.0 0.0 - 0.1 K/uL   Immature Granulocytes 1 %   Abs Immature Granulocytes 0.04 0.00 - 0.07 K/uL  Comprehensive metabolic panel     Status: Abnormal   Collection Time: 08/26/18  5:57 AM  Result Value Ref Range   Sodium 136 135 - 145 mmol/L   Potassium 3.9 3.5 - 5.1 mmol/L   Chloride 105 98 - 111 mmol/L   CO2 19 (L) 22 - 32 mmol/L   Glucose, Bld 113 (H) 70 - 99 mg/dL   BUN 6 6 - 20 mg/dL   Creatinine, Ser 0.49 0.44 - 1.00 mg/dL   Calcium 8.9 8.9 - 10.3 mg/dL   Total Protein 6.1 (L) 6.5 - 8.1 g/dL   Albumin 2.9 (L) 3.5 - 5.0 g/dL   AST 222 (H) 15 - 41 U/L   ALT 138 (H) 0 - 44 U/L   Alkaline Phosphatase 151 (H) 38 - 126 U/L   Total Bilirubin 1.2 0.3 - 1.2 mg/dL   GFR calc non Af Amer >60 >60 mL/min   GFR calc Af Amer >60 >60 mL/min   Anion gap 12 5 - 15  Lipase, blood     Status: None   Collection Time: 08/26/18  5:57 AM  Result Value Ref Range   Lipase 21 11 - 51 U/L  Protein / creatinine ratio, urine     Status: None   Collection Time:  08/26/18  6:44 AM  Result Value Ref Range   Creatinine, Urine 173.39 mg/dL   Total Protein, Urine 23 mg/dL   Protein Creatinine Ratio 0.13 0.00 - 0.15 mg/mg[Cre]  Hepatitis B surface antigen     Status: None   Collection Time: 08/26/18  8:33 AM  Result Value Ref Range   Hepatitis B Surface Ag Negative Negative  SARS Coronavirus 2 (CEPHEID - Performed in [place] hospital lab), Hosp Order     Status: None   Collection Time: 08/26/18 12:13 PM   Specimen: Nasopharyngeal Swab  Result Value Ref Range   SARS Coronavirus 2 NEGATIVE NEGATIVE  CBC     Status: Abnormal   Collection Time: 08/26/18 12:18 PM  Result Value Ref Range   WBC 6.6 4.0 - 10.5 K/uL   RBC 3.80 (L) 3.87 - 5.11 MIL/uL   Hemoglobin 11.8 (L) 12.0 - 15.0 g/dL   HCT 35.3 (L) 36.0 - 46.0 %   MCV 92.9 80.0 - 100.0 fL   MCH 31.1 26.0 - 34.0 pg   MCHC 33.4 30.0 - 36.0 g/dL   RDW 13.1 11.5 - 15.5 %   Platelets 193 150 - 400 K/uL  nRBC 0.0 0.0 - 0.2 %  Type and screen [place]     Status: None   Collection Time: 08/26/18 12:58 PM  Result Value Ref Range   ABO/RH(D) O NEG    Antibody Screen POS    Sample Expiration      08/29/2018,2359 Performed at [facility], [place] [address] [place], [place] [address]   Glucose, capillary     Status: None   Collection Time: 08/26/18  2:02 PM  Result Value Ref Range   Glucose-Capillary 82 70 - 99 mg/dL    Imaging:  US Abdomen Complete  Result Date: 08/26/2018 CLINICAL DATA:  Epigastric pain. Elevated liver function tests. Third trimester pregnancy. EXAM: ABDOMEN ULTRASOUND COMPLETE COMPARISON:  None. FINDINGS: Gallbladder: Multiple small gallstones are seen, however there is no evidence of gallbladder wall thickening or pericholecystic fluid. No sonographic Murphy sign noted by sonographer. Common bile duct: Diameter: 2 mm, within normal limits. Liver: No focal lesion identified. Within normal limits in parenchymal echogenicity. Portal vein is patent on  color Doppler imaging with normal direction of blood flow towards the liver. IVC: No abnormality visualized. Pancreas: Visualized portion unremarkable. Spleen: Size and appearance within normal limits. Right Kidney: Length: 11.0 cm. Echogenicity within normal limits. No mass or hydronephrosis visualized. Left Kidney: Length: 12.1 cm. Echogenicity within normal limits. No mass or hydronephrosis visualized. Abdominal aorta: No aneurysm visualized. Other findings: None. IMPRESSION: Cholelithiasis. No sonographic signs of cholecystitis, biliary dilatation, or other acute findings. Electronically Signed   By: Marlaine Hind M.D.   On: 08/26/2018 10:25    MAU Course: Orders Placed This Encounter  Procedures  . Culture, beta strep (group b only)  . SARS Coronavirus 2 (CEPHEID - Performed in [place] hospital lab), [facility]  . US Abdomen Complete  . Urinalysis, Routine w reflex microscopic  . CBC with Differential/Platelet  . Comprehensive metabolic panel  . Lipase, blood  . Hepatitis B surface antigen  . HCV Ab Reflex to Quant PCR  . Protein / creatinine ratio, urine  . CBC  . RPR  . Glucose, capillary  . Diet clear liquid Room service appropriate? Yes; Fluid consistency: Thin  . Vital signs  . Notify Physician  . Activity as tolerated  . Fetal monitoring per unit policy  . Cervical Exam  . Measure blood pressure post delivery every 15 min x 1 hour then every 30 min x 1 hour  . Fundal check post delivery every 15 min x 1 hour then every 30 min x 1 hour  . If Rapid HIV test positive or known HIV positive: initiate AZT orders  . May in and out cath x 2 for inability to void  . Insert foley catheter  . Discontinue foley prior to vaginal delivery  . Full code  . Droplet / Contact ([place]) Isolation  . Type and screen [place]  . Insert and maintain IV Line  . Admit to Inpatient (patient's expected length of stay will be greater than 2 midnights or inpatient only  procedure)   Meds ordered this encounter  Medications  . promethazine (PHENERGAN) injection 25 mg  . AND Linked Order Group   . alum & mag hydroxide-simeth (MAALOX/MYLANTA) 200-200-20 MG/5ML suspension 30 mL   . lidocaine (XYLOCAINE) 2 % viscous mouth solution 15 mL  . lactated ringers bolus 1,000 mL  . fentaNYL (SUBLIMAZE) injection 100 mcg  . lactated ringers infusion  . oxytocin (PITOCIN) IV BOLUS FROM BAG  .  oxytocin (PITOCIN) IV infusion 40 units in NS 1000 mL - Premix  . lactated ringers infusion 500-1,000 mL  . acetaminophen (TYLENOL) tablet 650 mg  . oxyCODONE-acetaminophen (PERCOCET/ROXICET) 5-325 MG per tablet 1 tablet  . oxyCODONE-acetaminophen (PERCOCET/ROXICET) 5-325 MG per tablet 2 tablet  . ondansetron (ZOFRAN) injection 4 mg  . sodium citrate-citric acid (ORACIT) solution 30 mL  . lidocaine (PF) (XYLOCAINE) 1 % injection 30 mL  . betamethasone acetate-betamethasone sodium phosphate (CELESTONE) injection 12 mg  . FOLLOWED BY Linked Order Group   . penicillin G potassium 5 Million Units in sodium chloride 0.9 % 250 mL IVPB     Order Specific Question:   Antibiotic Indication:     Answer:   Group B Strep Prophylaxis   . penicillin G 3 million units in sodium chloride 0.9% 100 mL IVPB     Order Specific Question:   Antibiotic Indication:     Answer:   Group B Strep Prophylaxis    MDM: Reactive NST Ctx Q4-7 minutes. Cervix 3.5/50/-3, very posterior. Ctx palpate mild.   Vitals stable.  Pt with TTP throughout upper abdomen. Negative murphy's sign.  IV phenergan & GI cocktail given while labs pending CBC normal. Elevated AST/ALT - will get imaging of abdomen & add hepatitis labs to blood work Lipase normal  Care turned over to [place], NP 08/26/2018 8:08 AM  Care assumed @0808 . -UA: amber/sm bilirubin/20ketones/30PRO -CBC w/ Diff: WNL -CMP: AST/ALT 222/138 -Lipase: WNL -HepB/HepC labs added -Abdominal US: multiple small gallstones, no  evidence of gallbladder wall thickening or pericholecystic fluid. Impression: cholelithiasis, no other acute findings. -after Phenergan and GI Cocktail, pt reports N/V have resolved -pt almost passed out when going to bathroom, feels like she stood up too fast, orthostatics done by [employer]. Dr. Rip Harbour notified -per Dr. Ervin @0958 , if normal PCr, call general surgery to see recommendations for elevated LFTs secondary to gallstones -PCr: 0.13 -spoke with Vickey Huger, a PA from General Surgery @1143 , will review patient's chart and will call me back. Returned call @1148  and recommends that patient try clear liquids while in MAU and if no worsening of symptoms, can go home and try to make it through the pregnancy and get a referral to [facility] postpartum. If things worsen after eating or if she goes home and develops worsening pain/fever/N/V can return and have inpatient consult. -repeat CE: 5/80/posterior, with bloody show on gloves after removal -called and spoke with Dr. [place] @1152  to relay general surgery consult and new cervical exam findings. Dr. Rip Harbour and Rip Harbour to come and see patient. -pt to be admitted to L&D based on cervical change -NST reviewed by Dr. Early Chars in MAU -pt admitted to L&D  Orders Placed This Encounter  Procedures  . Culture, beta strep (group b only)    Standing Status:   Standing    Number of Occurrences:   1  . SARS Coronavirus 2 (CEPHEID - Performed in [place] hospital lab), Hosp Order    Standing Status:   Standing    Number of Occurrences:   1    Order Specific Question:   Rule Out    Answer:   Yes  . [address] Abdomen Complete    Standing Status:   Standing    Number of Occurrences:   1    Order Specific Question:   Symptom/Reason for Exam    Answer:   Epigastric pain [place]    Order Specific Question:   Symptom/Reason for Exam  Answer:   Elevated liver function tests [301499]  . Urinalysis, Routine w reflex microscopic    Standing Status:   Standing     Number of Occurrences:   1  . CBC with Differential/Platelet    Standing Status:   Standing    Number of Occurrences:   1  . Comprehensive metabolic panel    Standing Status:   Standing    Number of Occurrences:   1  . Lipase, blood    Standing Status:   Standing    Number of Occurrences:   1  . Hepatitis B surface antigen    Standing Status:   Standing    Number of Occurrences:   1  . HCV Ab Reflex to Quant PCR    Standing Status:   Standing    Number of Occurrences:   1  . Protein / creatinine ratio, urine    Standing Status:   Standing    Number of Occurrences:   1  . CBC    Standing Status:   Standing    Number of Occurrences:   1  . RPR    Standing Status:   Standing    Number of Occurrences:   1  . Glucose, capillary    Standing Status:   Standing    Number of Occurrences:   1  . Diet clear liquid Room service appropriate? Yes; Fluid consistency: Thin    Standing Status:   Standing    Number of Occurrences:   1    Order Specific Question:   Room service appropriate?    Answer:   Yes    Order Specific Question:   Fluid consistency:    Answer:   Thin  . Vital signs    Standing Status:   Standing    Number of Occurrences:   1  . Notify Physician    Standing Status:   Standing    Number of Occurrences:   1    Order Specific Question:   Notify Physician    Answer:   for vaginal bleeding    Order Specific Question:   Notify Physician    Answer:   for acute abdominal pain    Order Specific Question:   Notify Physician    Answer:   for temperature >/= 100.4 F    Order Specific Question:   Notify Physician    Answer:   for significant change in vital signs    Order Specific Question:   Notify Physician    Answer:   for non-reassuring fetal heart rate pattern    Order Specific Question:   Notify Physician    Answer:   for imminent delivery or failure to progress  . Activity as tolerated    Standing Status:   Standing    Number of Occurrences:   1  . Fetal  monitoring per unit policy    Standing Status:   Standing    Number of Occurrences:   1  . Cervical Exam    Unless contraindicated, every 1-2 hours in active labor, or at nurse's discretion    Standing Status:   Standing    Number of Occurrences:   1  . Measure blood pressure post delivery every 15 min x 1 hour then every 30 min x 1 hour    Standing Status:   Standing    Number of Occurrences:   1  . Fundal check post delivery every 15 min x 1 hour then every 30 min  x 1 hour    Standing Status:   Standing    Number of Occurrences:   1  . If Rapid HIV test positive or known HIV positive: initiate AZT orders    Standing Status:   Standing    Number of Occurrences:   1  . May in and out cath x 2 for inability to void    Standing Status:   Standing    Number of Occurrences:   [phone]  . Insert foley catheter    1) If straight catheterized greater than 2 times or patient unable to void post epidural placement.  2) PRN for bladder distention.      Standing Status:   Standing    Number of Occurrences:   1  . Discontinue foley prior to vaginal delivery    Standing Status:   Standing    Number of Occurrences:   1  . Full code    Standing Status:   Standing    Number of Occurrences:   1  . Droplet / Contact ([place]) Isolation    EBSL -    Standing Status:   Standing    Number of Occurrences:   1  . Type and screen [place]     Standing Status:   Standing    Number of Occurrences:   1  . Insert and maintain IV Line    Standing Status:   Standing    Number of Occurrences:   1  . Admit to Inpatient (patient's expected length of stay will be greater than 2 midnights or inpatient only procedure)    Standing Status:   Standing    Number of Occurrences:   1    Order Specific Question:   Hospital Area    Answer:   [place] [[id number]]    Order Specific Question:   Level of Care    Answer:   Labor and Delivery [101]     Order Specific Question:   Covid Evaluation    Answer:   Asymptomatic Screening Protocol (No Symptoms)    Order Specific Question:   Diagnosis    Answer:   Preterm labor [932355]    Order Specific Question:   Admitting Physician    Answer:   T[facility]ett Main[facility] [[id number]]    Order Specific Question:   Attending Physician    Answer:   Truett Mainland [[id number]]    Order Specific Question:   Estimated length of stay    Answer:   inpatient only procedure    Order Specific Question:   Certification:    Answer:   I certify this patient is being admitted for an inpatient-only procedure    Order Specific Question:   PT Class (Do Not Modify)    Answer:   Inpatient [101]    Order Specific Question:   PT Acc Code (Do Not Modify)    Answer:   Private [1]   Meds ordered this encounter  Medications  . promethazine (PHENERGAN) injection 25 mg  . AND Linked Order Group   . alum & mag hydroxide-simeth (MAALOX/MYLANTA) 200-200-20 MG/5ML suspension 30 mL   . lidocaine (XYLOCAINE) 2 % viscous mouth solution 15 mL  . lactated ringers bolus 1,000 mL  . fentaNYL (SUBLIMAZE) injection 100 mcg  . lactated ringers infusion  . oxytocin (PITOCIN) IV BOLUS FROM BAG  . oxytocin (PITOCIN) IV infusion 40 units in NS 1000 mL - Premix  .  lactated ringers infusion 500-1,000 mL  . acetaminophen (TYLENOL) tablet 650 mg  . oxyCODONE-acetaminophen (PERCOCET/ROXICET) 5-325 MG per tablet 1 tablet  . oxyCODONE-acetaminophen (PERCOCET/ROXICET) 5-325 MG per tablet 2 tablet  . ondansetron (ZOFRAN) injection 4 mg  . sodium citrate-citric acid (ORACIT) solution 30 mL  . lidocaine (PF) (XYLOCAINE) 1 % injection 30 mL  . betamethasone acetate-betamethasone sodium phosphate (CELESTONE) injection 12 mg  . FOLLOWED BY Linked Order Group   . penicillin G potassium 5 Million Units in sodium chloride 0.9 % 250 mL IVPB     Order Specific Question:   Antibiotic Indication:     Answer:   Group B Strep Prophylaxis   . penicillin G 3 million  units in sodium chloride 0.9% 100 mL IVPB     Order Specific Question:   Antibiotic Indication:     Answer:   Group B Strep Prophylaxis   Assessment & Plan  -admit to L&D for delivery  Nugent, Gerrie Nordmann, NP  2:22 PM 08/26/2018

## 2018-08-26 NOTE — Progress Notes (Signed)
Patient observed for past several hours and has not actively labored Plan per MD is transfer to Specialty care for observation and to finish Betamethasone series  Vitals:   08/26/18 1300 08/26/18 1353 08/26/18 1700 08/26/18 1951  BP:  116/68 112/70 112/62  Pulse:  79 86 94  Resp:  16  17  Temp:  98.4 F (36.9 C)    TempSrc:  Oral    SpO2:      Weight: 70.3 kg     Height: 5\' 3"  (1.6 m)      FHR stable and reassuring Contractions:  None seen on EFM  Dilation: 4 Effacement (%): 50 Cervical Position: Posterior Station: -3 Presentation: Vertex Exam by:: Mary Martinique Johnson, RN   MD to follow in AM

## 2018-08-27 ENCOUNTER — Encounter (HOSPITAL_COMMUNITY): Payer: Self-pay | Admitting: *Deleted

## 2018-08-27 LAB — RPR: RPR Ser Ql: NONREACTIVE

## 2018-08-27 LAB — HCV AB W REFLEX TO QUANT PCR: HCV Ab: <0.1 s/co ratio (ref 0.0–0.9)

## 2018-08-27 LAB — HCV INTERPRETATION

## 2018-08-27 NOTE — Progress Notes (Signed)
Discharge instruction discussed with patient using interpreter services. Reviewed follow up appointments and discharge instructions.  No medications added.  Patient verbalized understanding.

## 2018-08-27 NOTE — Discharge Summary (Signed)
Antenatal Physician Discharge Summary  Patient ID: Heather Savage MRN: [id number] DOB/AGE: 27-Oct-1990 28 y.o.  Admit date: 08/26/2018 Discharge date: 08/27/2018  Admission Diagnoses: IUP 35 3/7 weeks, undelivered, cholelithiasis. GDM  Discharge Diagnoses: SAA  Prenatal Procedures: ultrasound  Consults: General surgery  Significant Diagnostic Studies:  Results for orders placed or performed during the hospital encounter of 08/26/18 (from the past 168 hour(s))  Urinalysis, Routine w reflex microscopic   Collection Time: 08/26/18  5:18 AM  Result Value Ref Range   Color, Urine AMBER (A) YELLOW   APPearance CLEAR CLEAR   Specific Gravity, Urine 1.025 1.005 - 1.030   pH 6.0 5.0 - 8.0   Glucose, UA NEGATIVE NEGATIVE mg/dL   Hgb urine dipstick NEGATIVE NEGATIVE   Bilirubin Urine SMALL (A) NEGATIVE   Ketones, ur 20 (A) NEGATIVE mg/dL   Protein, ur 30 (A) NEGATIVE mg/dL   Nitrite NEGATIVE NEGATIVE   Leukocytes,Ua NEGATIVE NEGATIVE   RBC / HPF 0-5 0 - 5 RBC/hpf   WBC, UA 0-5 0 - 5 WBC/hpf   Bacteria, UA NONE SEEN NONE SEEN   Squamous Epithelial / LPF 0-5 0 - 5   Mucus PRESENT   CBC with Differential/Platelet   Collection Time: 08/26/18  5:57 AM  Result Value Ref Range   WBC 7.5 4.0 - 10.5 K/uL   RBC 3.96 3.87 - 5.11 MIL/uL   Hemoglobin 12.1 12.0 - 15.0 g/dL   HCT 35.8 (L) 36.0 - 46.0 %   MCV 90.4 80.0 - 100.0 fL   MCH 30.6 26.0 - 34.0 pg   MCHC 33.8 30.0 - 36.0 g/dL   RDW 13.1 11.5 - 15.5 %   Platelets 198 150 - 400 K/uL   nRBC 0.0 0.0 - 0.2 %   Neutrophils Relative % 78 %   Neutro Abs 5.9 1.7 - 7.7 K/uL   Lymphocytes Relative 16 %   Lymphs Abs 1.2 0.7 - 4.0 K/uL   Monocytes Relative 5 %   Monocytes Absolute 0.4 0.1 - 1.0 K/uL   Eosinophils Relative 0 %   Eosinophils Absolute 0.0 0.0 - 0.5 K/uL   Basophils Relative 0 %   Basophils Absolute 0.0 0.0 - 0.1 K/uL   Immature Granulocytes 1 %   Abs Immature Granulocytes 0.04 0.00 - 0.07 K/uL  Comprehensive metabolic  panel   Collection Time: 08/26/18  5:57 AM  Result Value Ref Range   Sodium 136 135 - 145 mmol/L   Potassium 3.9 3.5 - 5.1 mmol/L   Chloride 105 98 - 111 mmol/L   CO2 19 (L) 22 - 32 mmol/L   Glucose, Bld 113 (H) 70 - 99 mg/dL   BUN 6 6 - 20 mg/dL   Creatinine, Ser 0.49 0.44 - 1.00 mg/dL   Calcium 8.9 8.9 - 10.3 mg/dL   Total Protein 6.1 (L) 6.5 - 8.1 g/dL   Albumin 2.9 (L) 3.5 - 5.0 g/dL   AST 222 (H) 15 - 41 U/L   ALT 138 (H) 0 - 44 U/L   Alkaline Phosphatase 151 (H) 38 - 126 U/L   Total Bilirubin 1.2 0.3 - 1.2 mg/dL   GFR calc non Af Amer >60 >60 mL/min   GFR calc Af Amer >60 >60 mL/min   Anion gap 12 5 - 15  Lipase, blood   Collection Time: 08/26/18  5:57 AM  Result Value Ref Range   Lipase 21 11 - 51 U/L  Protein / creatinine ratio, urine   Collection Time: 08/26/18  6:44 AM  Result Value Ref Range   Creatinine, Urine 173.39 mg/dL   Total Protein, Urine 23 mg/dL   Protein Creatinine Ratio 0.13 0.00 - 0.15 mg/mg[Cre]  Hepatitis B surface antigen   Collection Time: 08/26/18  8:33 AM  Result Value Ref Range   Hepatitis B Surface Ag Negative Negative  HCV Ab Reflex to Quant PCR   Collection Time: 08/26/18  8:33 AM  Result Value Ref Range   HCV Ab <0.1 0.0 - 0.9 s/co ratio  Interpretation:   Collection Time: 08/26/18  8:33 AM  Result Value Ref Range   HCV Interp 1: Comment   SARS Coronavirus 2 (CEPHEID - Performed in [place] hospital lab), [facility] Order   Collection Time: 08/26/18 12:13 PM   Specimen: Nasopharyngeal Swab  Result Value Ref Range   SARS Coronavirus 2 NEGATIVE NEGATIVE  CBC   Collection Time: 08/26/18 12:18 PM  Result Value Ref Range   WBC 6.6 4.0 - 10.5 K/uL   RBC 3.80 (L) 3.87 - 5.11 MIL/uL   Hemoglobin 11.8 (L) 12.0 - 15.0 g/dL   HCT 35.3 (L) 36.0 - 46.0 %   MCV 92.9 80.0 - 100.0 fL   MCH 31.1 26.0 - 34.0 pg   MCHC 33.4 30.0 - 36.0 g/dL   RDW 13.1 11.5 - 15.5 %   Platelets 193 150 - 400 K/uL   nRBC 0.0 0.0 - 0.2 %  RPR   Collection Time:  08/26/18 12:18 PM  Result Value Ref Range   RPR Ser Ql Non Reactive Non Reactive  Culture, beta strep (group b only)   Collection Time: 08/26/18 12:26 PM   Specimen: Vaginal/Rectal; Genital  Result Value Ref Range   Specimen Description VAGINAL/RECTAL    Special Requests NONE    Culture      CULTURE REINCUBATED FOR BETTER GROWTH Performed at [facility], [address] [place], [place] [address]    Report Status PENDING   Type and screen [place]   Collection Time: 08/26/18 12:58 PM  Result Value Ref Range   ABO/RH(D) O NEG    Antibody Screen POS    Sample Expiration 08/29/2018,2359    Antibody Identification PASSIVELY ACQUIRED ANTI-D    Unit Number [id number]    Blood Component Type RED CELLS,LR    Unit division 00    Status of Unit ALLOCATED    Transfusion Status OK TO TRANSFUSE    Crossmatch Result COMPATIBLE    Unit Number [id number]    Blood Component Type RED CELLS,LR    Unit division 00    Status of Unit ALLOCATED    Transfusion Status OK TO TRANSFUSE    Crossmatch Result COMPATIBLE   BPAM RBC   Collection Time: 08/26/18 12:58 PM  Result Value Ref Range   Blood Product Unit Number [id number]    PRODUCT CODE A4166A63    Unit Type and Rh 9500    Blood Product Expiration Date 016010932355    Blood Product Unit Number [id number]    PRODUCT CODE E0336V00    Unit Type and Rh 9500    Blood Product Expiration Date [id number]   Glucose, capillary   Collection Time: 08/26/18  2:02 PM  Result Value Ref Range   Glucose-Capillary 82 70 - 99 mg/dL  Glucose, capillary   Collection Time: 08/26/18 10:41 PM  Result Value Ref Range   Glucose-Capillary 174 (H) 70 - 99 mg/dL  Results for orders placed or performed during the hospital encounter of 08/24/18 (from the past  168 hour(s))  Urinalysis, Routine w reflex microscopic   Collection Time: 08/24/18  8:40 PM  Result Value Ref Range   Color, Urine YELLOW YELLOW   APPearance HAZY  (A) CLEAR   Specific Gravity, Urine 1.023 1.005 - 1.030   pH 6.0 5.0 - 8.0   Glucose, UA NEGATIVE NEGATIVE mg/dL   Hgb urine dipstick NEGATIVE NEGATIVE   Bilirubin Urine NEGATIVE NEGATIVE   Ketones, ur NEGATIVE NEGATIVE mg/dL   Protein, ur NEGATIVE NEGATIVE mg/dL   Nitrite NEGATIVE NEGATIVE   Leukocytes,Ua SMALL (A) NEGATIVE   RBC / HPF 0-5 0 - 5 RBC/hpf   WBC, UA 6-10 0 - 5 WBC/hpf   Bacteria, UA RARE (A) NONE SEEN   Squamous Epithelial / LPF 6-10 0 - 5   Mucus PRESENT     Treatments: IV hydration, antibiotics and steroids  Hospital Course:  This is a 28 y.o. G3P1011 with IUP at 62w3dpresented to MAU with N/V and abd pain. Found to have cholelithiasis. General surgery consult and PP follow recommended. Pt noted to have advance cervical dilation. She was observed in L & D without any further progress in her cervix. [facility].  She was observed, fetal heart rate monitoring remained reassuring, and she had no signs/symptoms of progressing preterm labor or other maternal-fetal concerns.  Her cervical exam was unchanged from admission. She was tolerating regular diet. Denies ut cxt, VB or LOF.  She was deemed stable for discharge to home with outpatient follow up.  Discharge Exam: BP 120/65 (BP Location: Right Arm)   Pulse 83   Temp 98.3 F (36.8 C) (Oral)   Resp 18   Ht 5' 3"  (1.6 m)   Wt 70.3 kg   LMP 12/22/2017 (Exact Date)   SpO2 100%   BMI 27.45 kg/m  Lungs clear Heart RRR Abd soft + BS gravid non tender GU cervix 4-5 cm/50/-2 Ext non tender Discharge Condition: Stable  Disposition: Discharge disposition: 01-Home or Self Care       Discharge Instructions    Discharge activity:  No Restrictions   Complete by: As directed    Discharge diet:  No restrictions   Complete by: As directed    Do not have sex or do anything that might make you have an orgasm   Complete by: As directed    Fetal Kick Count:   Lie on our left side for one hour after a meal, and count the number of times your baby kicks.  If it is less than 5 times, get up, move around and drink some juice.  Repeat the test 30 minutes later.  If it is still less than 5 kicks in an hour, notify your doctor.   Complete by: As directed    Notify physician for a general feeling that "something is not right"   Complete by: As directed    Notify physician for increase or change in vaginal discharge   Complete by: As directed    Notify physician for intestinal cramps, with or without diarrhea, sometimes described as "gas pain"   Complete by: As directed    Notify physician for leaking of fluid   Complete by: As directed    Notify physician for low, dull backache, unrelieved by heat or Tylenol   Complete by: As directed    Notify physician for menstrual like cramps   Complete by: As directed    Notify physician for pelvic pressure  Complete by: As directed    Notify physician for uterine contractions.  These may be painless and feel like the uterus is tightening or the baby is  "balling up"   Complete by: As directed    Notify physician for vaginal bleeding   Complete by: As directed    PRETERM LABOR:  Includes any of the follwing symptoms that occur between 20 - [redacted] weeks gestation.  If these symptoms are not stopped, preterm labor can result in preterm delivery, placing your baby at risk   Complete by: As directed      Allergies as of 08/27/2018   No Known Allergies     Medication List    STOP taking these medications   ondansetron 4 MG disintegrating tablet Commonly known as: Zofran ODT     TAKE these medications   Accu-Chek FastClix Lancets Misc 1 Stick by Percutaneous route 4 (four) times daily.   Accu-Chek Guide test strip Generic drug: glucose blood Use to check blood sugars four times a day as instructed   Accu-Chek Guide w/Device Kit 1 Device by Percutaneous route 4 (four) times daily.   cyclobenzaprine 10 MG  tablet Commonly known as: FLEXERIL Take 1 tablet (10 mg total) by mouth 2 (two) times daily as needed for muscle spasms.   famotidine 20 MG tablet Commonly known as: PEPCID Take 1 tablet (20 mg total) by mouth daily.   metFORMIN 500 MG tablet Commonly known as: GLUCOPHAGE Take 1 tablet (500 mg total) by mouth 2 (two) times daily with a meal.   omeprazole 40 MG capsule Commonly known as: PRILOSEC Take 1 capsule (40 mg total) by mouth daily.   Prenate Pixie 10-0.6-0.4-200 MG Caps Take 1 tablet by mouth daily.      Follow-up Information    [facility]. Call in 2 day(s).   Why: OB appt this Friday Contact information: [address] [place] [address] [phone]          Signed: Chancy Milroy M.D. 08/27/2018, 3:40 PM

## 2018-08-27 NOTE — Discharge Instructions (Signed)
Third Trimester of Pregnancy The third trimester is from week 28 through week 40 (months 7 through 9). The third trimester is a time when the unborn baby (fetus) is growing rapidly. At the end of the ninth month, the fetus is about 20 inches in length and weighs 6-10 pounds. Body changes during your third trimester Your body will continue to go through many changes during pregnancy. The changes vary from woman to woman. During the third trimester:  Your weight will continue to increase. You can expect to gain 25-35 pounds (11-16 kg) by the end of the pregnancy.  You may begin to get stretch marks on your hips, abdomen, and breasts.  You may urinate more often because the fetus is moving lower into your pelvis and pressing on your bladder.  You may develop or continue to have heartburn. This is caused by increased hormones that slow down muscles in the digestive tract.  You may develop or continue to have constipation because increased hormones slow digestion and cause the muscles that push waste through your intestines to relax.  You may develop hemorrhoids. These are swollen veins (varicose veins) in the rectum that can itch or be painful.  You may develop swollen, bulging veins (varicose veins) in your legs.  You may have increased body aches in the pelvis, back, or thighs. This is due to weight gain and increased hormones that are relaxing your joints.  You may have changes in your hair. These can include thickening of your hair, rapid growth, and changes in texture. Some women also have hair loss during or after pregnancy, or hair that feels dry or thin. Your hair will most likely return to normal after your baby is born.  Your breasts will continue to grow and they will continue to become tender. A yellow fluid (colostrum) may leak from your breasts. This is the first milk you are producing for your baby.  Your belly button may stick out.  You may notice more swelling in your hands,  face, or ankles.  You may have increased tingling or numbness in your hands, arms, and legs. The skin on your belly may also feel numb.  You may feel short of breath because of your expanding uterus.  You may have more problems sleeping. This can be caused by the size of your belly, increased need to urinate, and an increase in your body's metabolism.  You may notice the fetus "dropping," or moving lower in your abdomen (lightening).  You may have increased vaginal discharge.  You may notice your joints feel loose and you may have pain around your pelvic bone. What to expect at prenatal visits You will have prenatal exams every 2 weeks until week 36. Then you will have weekly prenatal exams. During a routine prenatal visit:  You will be weighed to make sure you and the baby are growing normally.  Your blood pressure will be taken.  Your abdomen will be measured to track your baby's growth.  The fetal heartbeat will be listened to.  Any test results from the previous visit will be discussed.  You may have a cervical check near your due date to see if your cervix has softened or thinned (effaced).  You will be tested for Group B streptococcus. This happens between 35 and 37 weeks. Your health care provider may ask you:  What your birth plan is.  How you are feeling.  If you are feeling the baby move.  If you have had any abnormal   symptoms, such as leaking fluid, bleeding, severe headaches, or abdominal cramping.  If you are using any tobacco products, including cigarettes, chewing tobacco, and electronic cigarettes.  If you have any questions. Other tests or screenings that may be performed during your third trimester include:  Blood tests that check for low iron levels (anemia).  Fetal testing to check the health, activity level, and growth of the fetus. Testing is done if you have certain medical conditions or if there are problems during the pregnancy.  Nonstress test  (NST). This test checks the health of your baby to make sure there are no signs of problems, such as the baby not getting enough oxygen. During this test, a belt is placed around your belly. The baby is made to move, and its heart rate is monitored during movement. What is false labor? False labor is a condition in which you feel small, irregular tightenings of the muscles in the womb (contractions) that usually go away with rest, changing position, or drinking water. These are called Braxton Hicks contractions. Contractions may last for hours, days, or even weeks before true labor sets in. If contractions come at regular intervals, become more frequent, increase in intensity, or become painful, you should see your health care provider. What are the signs of labor?  Abdominal cramps.  Regular contractions that start at 10 minutes apart and become stronger and more frequent with time.  Contractions that start on the top of the uterus and spread down to the lower abdomen and back.  Increased pelvic pressure and dull back pain.  A watery or bloody mucus discharge that comes from the vagina.  Leaking of amniotic fluid. This is also known as your "water breaking." It could be a slow trickle or a gush. Let your health care provider know if it has a color or strange odor. If you have any of these signs, call your health care provider right away, even if it is before your due date. Follow these instructions at home: Medicines  Follow your health care provider's instructions regarding medicine use. Specific medicines may be either safe or unsafe to take during pregnancy.  Take a prenatal vitamin that contains at least 600 micrograms (mcg) of folic acid.  If you develop constipation, try taking a stool softener if your health care provider approves. Eating and drinking   Eat a balanced diet that includes fresh fruits and vegetables, whole grains, good sources of protein such as meat, eggs, or tofu,  and low-fat dairy. Your health care provider will help you determine the amount of weight gain that is right for you.  Avoid raw meat and uncooked cheese. These carry germs that can cause birth defects in the baby.  If you have low calcium intake from food, talk to your health care provider about whether you should take a daily calcium supplement.  Eat four or five small meals rather than three large meals a day.  Limit foods that are high in fat and processed sugars, such as fried and sweet foods.  To prevent constipation: ? Drink enough fluid to keep your urine clear or pale yellow. ? Eat foods that are high in fiber, such as fresh fruits and vegetables, whole grains, and beans. Activity  Exercise only as directed by your health care provider. Most women can continue their usual exercise routine during pregnancy. Try to exercise for 30 minutes at least 5 days a week. Stop exercising if you experience uterine contractions.  Avoid heavy lifting.  Do   not exercise in extreme heat or humidity, or at high altitudes.  Wear low-heel, comfortable shoes.  Practice good posture.  You may continue to have sex unless your health care provider tells you otherwise. Relieving pain and discomfort  Take frequent breaks and rest with your legs elevated if you have leg cramps or low back pain.  Take warm sitz baths to soothe any pain or discomfort caused by hemorrhoids. Use hemorrhoid cream if your health care provider approves.  Wear a good support bra to prevent discomfort from breast tenderness.  If you develop varicose veins: ? Wear support pantyhose or compression stockings as told by your healthcare provider. ? Elevate your feet for 15 minutes, 3-4 times a day. Prenatal care  Write down your questions. Take them to your prenatal visits.  Keep all your prenatal visits as told by your health care provider. This is important. Safety  Wear your seat belt at all times when driving.  Make  a list of emergency phone numbers, including numbers for family, friends, the hospital, and police and fire departments. General instructions  Avoid cat litter boxes and soil used by cats. These carry germs that can cause birth defects in the baby. If you have a cat, ask someone to clean the litter box for you.  Do not travel far distances unless it is absolutely necessary and only with the approval of your health care provider.  Do not use hot tubs, steam rooms, or saunas.  Do not drink alcohol.  Do not use any products that contain nicotine or tobacco, such as cigarettes and e-cigarettes. If you need help quitting, ask your health care provider.  Do not use any medicinal herbs or unprescribed drugs. These chemicals affect the formation and growth of the baby.  Do not douche or use tampons or scented sanitary pads.  Do not cross your legs for long periods of time.  To prepare for the arrival of your baby: ? Take prenatal classes to understand, practice, and ask questions about labor and delivery. ? Make a trial run to the hospital. ? Visit the hospital and tour the maternity area. ? Arrange for maternity or paternity leave through employers. ? Arrange for family and friends to take care of pets while you are in the hospital. ? Purchase a rear-facing car seat and make sure you know how to install it in your car. ? Pack your hospital bag. ? Prepare the baby's nursery. Make sure to remove all pillows and stuffed animals from the baby's crib to prevent suffocation.  Visit your dentist if you have not gone during your pregnancy. Use a soft toothbrush to brush your teeth and be gentle when you floss. Contact a health care provider if:  You are unsure if you are in labor or if your water has broken.  You become dizzy.  You have mild pelvic cramps, pelvic pressure, or nagging pain in your abdominal area.  You have lower back pain.  You have persistent nausea, vomiting, or diarrhea.   You have an unusual or bad smelling vaginal discharge.  You have pain when you urinate. Get help right away if:  Your water breaks before 37 weeks.  You have regular contractions less than 5 minutes apart before 37 weeks.  You have a fever.  You are leaking fluid from your vagina.  You have spotting or bleeding from your vagina.  You have severe abdominal pain or cramping.  You have rapid weight loss or weight gain.  You have   shortness of breath with chest pain.  You notice sudden or extreme swelling of your face, hands, ankles, feet, or legs.  Your baby makes fewer than 10 movements in 2 hours.  You have severe headaches that do not go away when you take medicine.  You have vision changes. Summary  The third trimester is from week 28 through week 40, months 7 through 9. The third trimester is a time when the unborn baby (fetus) is growing rapidly.  During the third trimester, your discomfort may increase as you and your baby continue to gain weight. You may have abdominal, leg, and back pain, sleeping problems, and an increased need to urinate.  During the third trimester your breasts will keep growing and they will continue to become tender. A yellow fluid (colostrum) may leak from your breasts. This is the first milk you are producing for your baby.  False labor is a condition in which you feel small, irregular tightenings of the muscles in the womb (contractions) that eventually go away. These are called Braxton Hicks contractions. Contractions may last for hours, days, or even weeks before true labor sets in.  Signs of labor can include: abdominal cramps; regular contractions that start at 10 minutes apart and become stronger and more frequent with time; watery or bloody mucus discharge that comes from the vagina; increased pelvic pressure and dull back pain; and leaking of amniotic fluid. This information is not intended to replace advice given to you by your health  care provider. Make sure you discuss any questions you have with your health care provider. Document Released: 01/09/2001 Document Revised: 05/08/2018 Document Reviewed: 02/21/2016 [employer] Patient Education  2020 [employer]  

## 2018-08-28 LAB — TYPE AND SCREEN
ABO/RH(D): O NEG
Antibody Screen: POSITIVE
Unit division: 0
Unit division: 0

## 2018-08-28 LAB — BPAM RBC
Blood Product Expiration Date: 202008232359
Blood Product Expiration Date: 202008242359
Unit Type and Rh: 9500
Unit Type and Rh: 9500

## 2018-08-28 LAB — CULTURE, BETA STREP (GROUP B ONLY)

## 2018-08-29 ENCOUNTER — Other Ambulatory Visit: Payer: Self-pay

## 2018-08-29 ENCOUNTER — Ambulatory Visit (INDEPENDENT_AMBULATORY_CARE_PROVIDER_SITE_OTHER): Payer: Medicaid Other | Admitting: Obstetrics and Gynecology

## 2018-08-29 VITALS — BP 110/71 | HR 92 | Wt 156.0 lb

## 2018-08-29 DIAGNOSIS — Z3A35 35 weeks gestation of pregnancy: Secondary | ICD-10-CM

## 2018-08-29 DIAGNOSIS — O4703 False labor before 37 completed weeks of gestation, third trimester: Secondary | ICD-10-CM

## 2018-08-29 DIAGNOSIS — O26613 Liver and biliary tract disorders in pregnancy, third trimester: Secondary | ICD-10-CM

## 2018-08-29 DIAGNOSIS — K8020 Calculus of gallbladder without cholecystitis without obstruction: Secondary | ICD-10-CM

## 2018-08-29 NOTE — Progress Notes (Signed)
   PRENATAL VISIT NOTE  Subjective:  Heather Savage is a 28 y.o. G3P1011 at [redacted]w[redacted]d being seen today for a follow up visit from recent ED visit and admission due to cholelithiasis, and threatened preterm labor. She is currently monitored for the following issues for this high-risk pregnancy and has Hearing loss; Language barrier; Rh negative status during pregnancy; Supervision of other normal pregnancy, antepartum; E. coli UTI (urinary tract infection); Gestational diabetes; Preterm labor; Cholelithiasis affecting pregnancy in third trimester, antepartum; and Threatened premature labor in third trimester on their problem list.  Patient reports some mucoid brown discharge.  Contractions: Not present. Vag. Bleeding: None.  Movement: Present. Denies leaking of fluid.   The following portions of the patient's history were reviewed and updated as appropriate: allergies, current medications, past family history, past medical history, past social history, past surgical history and problem list.   Objective:   Vitals:   08/29/18 1011  BP: 110/71  Pulse: 92  Weight: 156 lb (70.8 kg)    Fetal Status:     Movement: Present     General:  Alert, oriented and cooperative. Patient is in no acute distress.  Skin: Skin is warm and dry. No rash noted.   Cardiovascular: Normal heart rate noted  Respiratory: Normal respiratory effort, no problems with respiration noted  Abdomen: Soft, gravid, appropriate for gestational age.  Pain/Pressure: Absent     Pelvic: Cervical exam performed Dilation: 4 Effacement (%): Thick Station: -3  Extremities: Normal range of motion.     Mental Status: Normal mood and affect. Normal behavior. Normal judgment and thought content.   Assessment and Plan:  Pregnancy: G3P1011 at [redacted]w[redacted]d  1. Threatened premature labor in third trimester  She received BMZ in the hospital x2 Cervix unchanged from hospital. No blood noted on exam glove today Abdomen soft, patient in no  distress.   2. Cholelithiasis affecting pregnancy in third trimester, antepartum   There are no diagnoses linked to this encounter. Preterm labor symptoms and general obstetric precautions including but not limited to vaginal bleeding, contractions, leaking of fluid and fetal movement were reviewed in detail with the patient. Please refer to After Visit Summary for other counseling recommendations.   Return in about 1 week (around 09/05/2018), or OB visit in the office with MD.  Future Appointments  Date Time Provider [place]  09/01/2018 11:15 AM [place] [facility]  09/01/2018 11:15 AM [place] [facility]  09/03/2018 10:00 AM [place], MD [facility] None    Noni Saupe, NP

## 2018-09-01 ENCOUNTER — Other Ambulatory Visit: Payer: Self-pay

## 2018-09-01 ENCOUNTER — Ambulatory Visit (HOSPITAL_COMMUNITY)
Admission: RE | Admit: 2018-09-01 | Discharge: 2018-09-01 | Disposition: A | Discharge status: Home / Self Care | Payer: Medicaid Other | Source: Ambulatory Visit | Attending: Obstetrics and Gynecology | Admitting: Obstetrics and Gynecology

## 2018-09-01 ENCOUNTER — Encounter (HOSPITAL_COMMUNITY): Payer: Self-pay | Admitting: *Deleted

## 2018-09-01 ENCOUNTER — Ambulatory Visit (HOSPITAL_COMMUNITY): Payer: Medicaid Other | Admitting: *Deleted

## 2018-09-01 ENCOUNTER — Other Ambulatory Visit (HOSPITAL_COMMUNITY): Payer: Self-pay | Admitting: *Deleted

## 2018-09-01 DIAGNOSIS — Z3A36 36 weeks gestation of pregnancy: Secondary | ICD-10-CM

## 2018-09-01 DIAGNOSIS — O24415 Gestational diabetes mellitus in pregnancy, controlled by oral hypoglycemic drugs: Secondary | ICD-10-CM | POA: Diagnosis not present

## 2018-09-01 DIAGNOSIS — N390 Urinary tract infection, site not specified: Secondary | ICD-10-CM | POA: Diagnosis not present

## 2018-09-01 DIAGNOSIS — O24419 Gestational diabetes mellitus in pregnancy, unspecified control: Secondary | ICD-10-CM

## 2018-09-01 DIAGNOSIS — B9620 Unspecified Escherichia coli [E. coli] as the cause of diseases classified elsewhere: Secondary | ICD-10-CM | POA: Diagnosis not present

## 2018-09-01 DIAGNOSIS — Z362 Encounter for other antenatal screening follow-up: Secondary | ICD-10-CM

## 2018-09-03 ENCOUNTER — Encounter: Payer: Self-pay | Admitting: Obstetrics & Gynecology

## 2018-09-03 ENCOUNTER — Other Ambulatory Visit (HOSPITAL_COMMUNITY)
Admission: RE | Admit: 2018-09-03 | Discharge: 2018-09-03 | Disposition: A | Discharge status: Home / Self Care | Payer: Medicaid Other | Source: Ambulatory Visit | Attending: Obstetrics & Gynecology | Admitting: Obstetrics & Gynecology

## 2018-09-03 ENCOUNTER — Other Ambulatory Visit: Payer: Self-pay

## 2018-09-03 ENCOUNTER — Ambulatory Visit (INDEPENDENT_AMBULATORY_CARE_PROVIDER_SITE_OTHER): Payer: Medicaid Other | Admitting: Obstetrics & Gynecology

## 2018-09-03 VITALS — BP 102/67 | HR 84 | Temp 97.1°F | Wt 156.0 lb

## 2018-09-03 DIAGNOSIS — Z3480 Encounter for supervision of other normal pregnancy, unspecified trimester: Secondary | ICD-10-CM

## 2018-09-03 DIAGNOSIS — O26893 Other specified pregnancy related conditions, third trimester: Secondary | ICD-10-CM

## 2018-09-03 DIAGNOSIS — O24410 Gestational diabetes mellitus in pregnancy, diet controlled: Secondary | ICD-10-CM

## 2018-09-03 DIAGNOSIS — Z3A36 36 weeks gestation of pregnancy: Secondary | ICD-10-CM

## 2018-09-03 MED ORDER — FAMOTIDINE 20 MG PO TABS: 20.0000 mg | ORAL_TABLET | Freq: Two times a day (BID) | ORAL | 2 refills | Status: DC

## 2018-09-03 MED ORDER — METFORMIN HCL 500 MG PO TABS: 1000.0000 mg | ORAL_TABLET | Freq: Two times a day (BID) | ORAL | 5 refills | Status: DC

## 2018-09-03 NOTE — Progress Notes (Signed)
CC: NONE   Interpreter # [id number]  Pt notes she has been checking her blood sugars at home.

## 2018-09-03 NOTE — Progress Notes (Signed)
   PRENATAL VISIT NOTE  Subjective:  Heather Savage is a 28 y.o. G3P1011 at [redacted]w[redacted]d being seen today for ongoing prenatal care.  She is currently monitored for the following issues for this high-risk pregnancy and has Hearing loss; Language barrier; Rh negative status during pregnancy; Supervision of other normal pregnancy, antepartum; E. coli UTI (urinary tract infection); Gestational diabetes; Preterm labor; Cholelithiasis affecting pregnancy in third trimester, antepartum; and Threatened premature labor in third trimester on their problem list.  Patient reports occasional contractions.  Contractions: Irritability. Vag. Bleeding: None.  Movement: Present. Denies leaking of fluid.   The following portions of the patient's history were reviewed and updated as appropriate: allergies, current medications, past family history, past medical history, past social history, past surgical history and problem list.   Objective:   Vitals:   09/03/18 1014  BP: 102/67  Pulse: 84  Temp: (!) 97.1 F (36.2 C)  Weight: 156 lb (70.8 kg)    Fetal Status: Fetal Heart Rate (bpm): 140   Movement: Present     General:  Alert, oriented and cooperative. Patient is in no acute distress.  Skin: Skin is warm and dry. No rash noted.   Cardiovascular: Normal heart rate noted  Respiratory: Normal respiratory effort, no problems with respiration noted  Abdomen: Soft, gravid, appropriate for gestational age.  Pain/Pressure: Present     Pelvic: Cervical exam performed        Extremities: Normal range of motion.  Edema: None  Mental Status: Normal mood and affect. Normal behavior. Normal judgment and thought content.   Assessment and Plan:  Pregnancy: G3P1011 at [redacted]w[redacted]d 1. Supervision of other normal pregnancy, antepartum [place], GBS neg 1 week ago not repeated today - Cervicovaginal ancillary only( [place]) - metFORMIN (GLUCOPHAGE) 500 MG tablet; Take 2 tablets (1,000 mg total) by mouth 2 (two) times daily  with a meal.  Dispense: 90 tablet; Refill: 5 - famotidine (PEPCID) 20 MG tablet; Take 1 tablet (20 mg total) by mouth 2 (two) times daily.  Dispense: 60 tablet; Refill: 2  Preterm labor symptoms and general obstetric precautions including but not limited to vaginal bleeding, contractions, leaking of fluid and fetal movement were reviewed in detail with the patient. Please refer to After Visit Summary for other counseling recommendations.  FBS elevated metformin dose increased today Return in about 1 week (around 09/10/2018) for virtual w/interpreter.  Future Appointments  Date Time Provider [address]  09/08/2018  3:30 PM [place] [facility]  09/08/2018  3:30 PM [place] [facility]  09/15/2018 11:30 AM [facility] NURSE [place] [facility]  09/15/2018 11:30 AM [facility] [place] 1 [facility] M[facility]-US    Emeterio Reeve, MD

## 2018-09-03 NOTE — Patient Instructions (Signed)
Type 1 or Type 2 Diabetes Mellitus During Pregnancy, Diagnosis Type 1 diabetes (type 1 diabetes mellitus) and type 2 diabetes (type 2 diabetes mellitus) are long-term (chronic) diseases. Your diabetes may be caused by one or both of these problems:  Your pancreas does not make enough of a hormone called insulin.  Your pancreas does not respond in a normal way to insulin that it makes. Insulin lets sugars (glucose) go into cells in the body. This gives you energy. If you have diabetes, sugars cannot get into cells. This causes high blood sugar (hyperglycemia). If diabetes is treated, it may not hurt you or your baby. Your doctor will set treatment goals for you. In general, you should have these blood sugar levels:  After not eating for a long time (fasting): 95 mg/dL (5.3 mmol/L).  After meals (postprandial): ? One hour after a meal: at or below 140 mg/dL (7.8 mmol/L). ? Two hours after a meal: at or below 120 mg/dL (6.7 mmol/L).  A1c (hemoglobin A1c) level: 6-6.5%. Follow these instructions at home: Questions to ask your doctor  You may want to ask these questions: ? Do I need to meet with a diabetes educator? ? Where can I find a support group for people with diabetes? ? What equipment will I need to care for myself at home? ? What medicines do I need? When should I take them? ? How often do I need to check my blood sugar? ? What number can I call if I have questions? ? When is my next doctor's visit? General instructions  Take over-the-counter and prescription medicines only as told by your doctor.  Stay at a healthy weight during pregnancy.  Keep all follow-up visits as told by your doctor. This is important. Contact a doctor if:  Your blood sugar is at or above 240 mg/dL (13.3 mmol/L).  Your blood sugar is at or above 200 mg/dL (11.1 mmol/L), and you have ketones in your pee (urine).  You have been sick or have had a fever for 2 days or more and you are not getting  better.  You have any of these problems for more than 6 hours: ? You cannot eat or drink. ? You feel sick to your stomach (nauseous). ? You throw up (vomit). ? You have watery poop (diarrhea). Get help right away if:  Your blood sugar is lower than 54 mg/dL (3 mmol/L).  You get confused.  You have trouble: ? Thinking clearly. ? Breathing.  Your baby moves less than normal.  You have: ? Moderate or large ketone levels in your pee. ? Vaginal bleeding. ? Unusual fluid coming from your vagina. ? Early contractions. These may feel like tightness in your belly. Summary  Type 1 diabetes (type 1 diabetes mellitus) and type 2 diabetes (type 2 diabetes mellitus) are long-term (chronic) diseases.  If diabetes is treated, it may not hurt you or your baby.  Your doctor will set treatment goals for you. This information is not intended to replace advice given to you by your health care provider. Make sure you discuss any questions you have with your health care provider. Document Released: 05/09/2015 Document Revised: 05/06/2018 Document Reviewed: 02/18/2015 Elsevier Patient Education  2020 Elsevier Inc.  

## 2018-09-08 ENCOUNTER — Encounter (HOSPITAL_COMMUNITY): Payer: Self-pay | Admitting: *Deleted

## 2018-09-08 ENCOUNTER — Other Ambulatory Visit: Payer: Self-pay

## 2018-09-08 ENCOUNTER — Other Ambulatory Visit (HOSPITAL_COMMUNITY): Payer: Self-pay | Admitting: Obstetrics and Gynecology

## 2018-09-08 ENCOUNTER — Ambulatory Visit (HOSPITAL_COMMUNITY)
Admission: RE | Admit: 2018-09-08 | Discharge: 2018-09-08 | Disposition: A | Discharge status: Home / Self Care | Payer: Medicaid Other | Source: Ambulatory Visit | Attending: Obstetrics and Gynecology | Admitting: Obstetrics and Gynecology

## 2018-09-08 ENCOUNTER — Inpatient Hospital Stay (HOSPITAL_COMMUNITY)
Admission: AD | Admit: 2018-09-08 | Discharge: 2018-09-11 | DRG: 806 | Disposition: A | Discharge status: Home / Self Care | Payer: Medicaid Other | Attending: Obstetrics and Gynecology | Admitting: Obstetrics and Gynecology

## 2018-09-08 ENCOUNTER — Ambulatory Visit (HOSPITAL_COMMUNITY): Payer: Medicaid Other | Admitting: *Deleted

## 2018-09-08 ENCOUNTER — Encounter (HOSPITAL_COMMUNITY): Payer: Self-pay | Admitting: Obstetrics and Gynecology

## 2018-09-08 ENCOUNTER — Inpatient Hospital Stay (HOSPITAL_COMMUNITY)
Admission: AD | Admit: 2018-09-08 | Discharge: 2018-09-08 | Discharge status: Absent without leave | Payer: Medicaid Other | Source: Home / Self Care | Attending: Obstetrics and Gynecology | Admitting: Obstetrics and Gynecology

## 2018-09-08 DIAGNOSIS — K8020 Calculus of gallbladder without cholecystitis without obstruction: Secondary | ICD-10-CM | POA: Diagnosis present

## 2018-09-08 DIAGNOSIS — O24419 Gestational diabetes mellitus in pregnancy, unspecified control: Secondary | ICD-10-CM

## 2018-09-08 DIAGNOSIS — Z603 Acculturation difficulty: Secondary | ICD-10-CM | POA: Diagnosis present

## 2018-09-08 DIAGNOSIS — Z20828 Contact with and (suspected) exposure to other viral communicable diseases: Secondary | ICD-10-CM | POA: Diagnosis present

## 2018-09-08 DIAGNOSIS — O2662 Liver and biliary tract disorders in childbirth: Secondary | ICD-10-CM | POA: Diagnosis present

## 2018-09-08 DIAGNOSIS — O24415 Gestational diabetes mellitus in pregnancy, controlled by oral hypoglycemic drugs: Secondary | ICD-10-CM

## 2018-09-08 DIAGNOSIS — O26613 Liver and biliary tract disorders in pregnancy, third trimester: Secondary | ICD-10-CM | POA: Diagnosis present

## 2018-09-08 DIAGNOSIS — O26899 Other specified pregnancy related conditions, unspecified trimester: Secondary | ICD-10-CM

## 2018-09-08 DIAGNOSIS — O26893 Other specified pregnancy related conditions, third trimester: Secondary | ICD-10-CM | POA: Diagnosis present

## 2018-09-08 DIAGNOSIS — B9620 Unspecified Escherichia coli [E. coli] as the cause of diseases classified elsewhere: Secondary | ICD-10-CM

## 2018-09-08 DIAGNOSIS — Z789 Other specified health status: Secondary | ICD-10-CM | POA: Diagnosis present

## 2018-09-08 DIAGNOSIS — Z3A37 37 weeks gestation of pregnancy: Secondary | ICD-10-CM | POA: Diagnosis not present

## 2018-09-08 DIAGNOSIS — Z6791 Unspecified blood type, Rh negative: Secondary | ICD-10-CM | POA: Diagnosis not present

## 2018-09-08 DIAGNOSIS — Z8632 Personal history of gestational diabetes: Secondary | ICD-10-CM | POA: Diagnosis present

## 2018-09-08 DIAGNOSIS — N390 Urinary tract infection, site not specified: Secondary | ICD-10-CM | POA: Insufficient documentation

## 2018-09-08 DIAGNOSIS — O24420 Gestational diabetes mellitus in childbirth, diet controlled: Secondary | ICD-10-CM | POA: Diagnosis present

## 2018-09-08 LAB — CERVICOVAGINAL ANCILLARY ONLY
Bacterial vaginitis: NEGATIVE
Candida vaginitis: NEGATIVE
Chlamydia: NEGATIVE
Neisseria Gonorrhea: NEGATIVE
Trichomonas: NEGATIVE

## 2018-09-08 LAB — CBC
HCT: 35.3 % — ABNORMAL LOW (ref 36.0–46.0)
Hemoglobin: 12 g/dL (ref 12.0–15.0)
MCH: 30.5 pg (ref 26.0–34.0)
MCHC: 34 g/dL (ref 30.0–36.0)
MCV: 89.6 fL (ref 80.0–100.0)
Platelets: 171 10*3/uL (ref 150–400)
RBC: 3.94 MIL/uL (ref 3.87–5.11)
RDW: 13.2 % (ref 11.5–15.5)
WBC: 7.1 10*3/uL (ref 4.0–10.5)
nRBC: 0 % (ref 0.0–0.2)

## 2018-09-08 LAB — GLUCOSE, CAPILLARY: Glucose-Capillary: 103 mg/dL — ABNORMAL HIGH (ref 70–99)

## 2018-09-08 LAB — SARS CORONAVIRUS 2 BY RT PCR (HOSPITAL ORDER, PERFORMED IN ~~LOC~~ HOSPITAL LAB): SARS Coronavirus 2: NEGATIVE

## 2018-09-08 LAB — GROUP B STREP BY PCR: Group B strep by PCR: NEGATIVE

## 2018-09-08 MED ORDER — LACTATED RINGERS IV SOLN: 500.0000 mL | INTRAVENOUS | Status: DC | PRN

## 2018-09-08 MED ORDER — OXYTOCIN BOLUS FROM INFUSION
500.0000 mL | Freq: Once | INTRAVENOUS | Status: AC
  Administered 2018-09-09: 500 mL via INTRAVENOUS

## 2018-09-08 MED ORDER — OXYTOCIN 40 UNITS IN NORMAL SALINE INFUSION - SIMPLE MED
1.0000 m[IU]/min | INTRAVENOUS | Status: DC
  Administered 2018-09-08: 21:00:00 2 m[IU]/min via INTRAVENOUS

## 2018-09-08 MED ORDER — OXYCODONE-ACETAMINOPHEN 5-325 MG PO TABS: 1.0000 | ORAL_TABLET | ORAL | Status: DC | PRN

## 2018-09-08 MED ORDER — OXYCODONE-ACETAMINOPHEN 5-325 MG PO TABS: 2.0000 | ORAL_TABLET | ORAL | Status: DC | PRN

## 2018-09-08 MED ORDER — ONDANSETRON HCL 4 MG/2ML IJ SOLN: 4.0000 mg | Freq: Four times a day (QID) | INTRAMUSCULAR | Status: DC | PRN

## 2018-09-08 MED ORDER — LACTATED RINGERS IV SOLN
INTRAVENOUS | Status: DC
  Administered 2018-09-08: 20:00:00 via INTRAVENOUS

## 2018-09-08 MED ORDER — LIDOCAINE HCL (PF) 1 % IJ SOLN: 30.0000 mL | INTRAMUSCULAR | Status: DC | PRN

## 2018-09-08 MED ORDER — SOD CITRATE-CITRIC ACID 500-334 MG/5ML PO SOLN
30.0000 mL | ORAL | Status: DC | PRN
  Administered 2018-09-09: 30 mL via ORAL
  Filled 2018-09-08: qty 30

## 2018-09-08 MED ORDER — TERBUTALINE SULFATE 1 MG/ML IJ SOLN: 0.2500 mg | Freq: Once | INTRAMUSCULAR | Status: DC | PRN

## 2018-09-08 MED ORDER — FLEET ENEMA 7-19 GM/118ML RE ENEM: 1.0000 | ENEMA | RECTAL | Status: DC | PRN

## 2018-09-08 MED ORDER — ACETAMINOPHEN 325 MG PO TABS: 650.0000 mg | ORAL_TABLET | ORAL | Status: DC | PRN

## 2018-09-08 MED ORDER — OXYTOCIN 40 UNITS IN NORMAL SALINE INFUSION - SIMPLE MED
2.5000 [IU]/h | INTRAVENOUS | Status: DC
  Administered 2018-09-09: 2.5 [IU]/h via INTRAVENOUS
  Filled 2018-09-08: qty 1000

## 2018-09-08 NOTE — H&P (Signed)
OBSTETRIC ADMISSION HISTORY AND PHYSICAL -- with assistance from video [employer] Interpreter Heather Savage #[id number]  Heather Savage is a 28 y.o. female G45P1011 with IUP at 15w1dby LMP presenting for IOL for non-reassuring fetal testing. She had a BPP 4/8 today (off for tone and movement). MFM recommended delivery today.   Reports fetal movement. Denies vaginal bleeding. No complaints.  [facility]  Support person in labor: FOB  Ultrasounds . Anatomy U/S: Normal  Prenatal History/Complications: .Marland KitchenGDM A1 . Cholelithiasis (needs to see general surgeon PP)  OB BOX: Nursing Staff Provider  Office Location  [place] Dating   LMP  Language   Amharic Anatomy [place]  normal female   Flu Vaccine   decline 3/18 Genetic Screen  NIPS: low risk female   AFP: negative    TDaP vaccine   07/10/2018 Hgb A1C or  GTT Early  Third trimester: elevated  Rhogam   07/10/2018   LAB RESULTS   Feeding Plan  breast Blood Type O/Negative/-- (02/19 1535)   Contraception  undecided 6/11 Antibody Negative (02/19 1535)  Circumcision  NA Rubella  IMMUNE (02/19 1535)  Pediatrician   [facility] RPR Non Reactive (06/11 0914)   Support Person FOB HBsAg Negative (02/19 1535)   Prenatal Classes NO HIV Non Reactive (06/11 0914)  BTL Consent  GBS  (For PCN allergy, check sensitivities)   VBAC Consent  Pap  03/19/18- negative    Hgb Electro   normal   Office supply BP cuff given to pt CF  negative     SMA  3 copies   Past Medical History: Past Medical History:  Diagnosis Date  . Anemia    states has low blood count, no current meds.  .Marland KitchenHearing loss    right  . History of gastritis    no current med.  .Marland KitchenHistory of malaria 2012  . Preterm labor   . Rh negative, maternal   . Tympanic membrane perforation 05/2012   right    Past Surgical History: Past Surgical History:  Procedure Laterality Date  . EXTERNAL EAR SURGERY    . SCAR REVISION  08/27/2011   Procedure: SCAR REVISION;   Surgeon: GCristine Polio MD;  [facility]  Service: Plastics;  Laterality: N/A;  scar revision of forehead  . TYMPANOPLASTY  05/14/2011   Procedure: TYMPANOPLASTY;  Surgeon: JIzora Gala MD;  [facility]  Service: ENT;  Laterality: Left;  TYMPANOPLASYT AND  POSSIBLE OSSICULARPLASTY   . TYMPANOPLASTY  10/10/2011   Procedure: TYMPANOPLASTY;  Surgeon: JIzora Gala MD;  [facility]  Service: ENT;  Laterality: Right;  . TYMPANOPLASTY Right 06/30/2012   Procedure: REVISION RIGHT TYMPANOPLASTY;  Surgeon: JIzora Gala MD;  [facility]  Service: ENT;  Laterality: Right;    Obstetrical History: OB History    Gravida  3   Para  1   Term  1   Preterm  0   AB  1   Living  1     SAB  1   TAB  0   Ectopic  0   Multiple      Live Births  1           Social History: Social History   Socioeconomic History  . Marital status: Single    Spouse name: Not on file  . Number of children: 1  . Years of education: Not on file  .  Highest education level: Not on file  Occupational History  . Not on file  Social Needs  . Financial resource strain: Not on file  . Food insecurity    Worry: Not on file    Inability: Not on file  . Transportation needs    Medical: Not on file    Non-medical: Not on file  Tobacco Use  . Smoking status: Never Smoker  . Smokeless tobacco: Never Used  Substance and Sexual Activity  . Alcohol use: Never    Frequency: Never  . Drug use: Never  . Sexual activity: Yes    Birth control/protection: None  Lifestyle  . Physical activity    Days per week: 0 days    Minutes per session: 0 min  . Stress: Not at all  Relationships  . Social Herbalist on phone: Not on file    Gets together: Not on file    Attends religious service: Not on file    Active member of club or organization: Not on file    Attends meetings of clubs or organizations: Not on file     Relationship status: Not on file  Other Topics Concern  . Not on file  Social History Narrative   Immigrated from [place] in March 15, 2011.  [place] family background.  Planning on becoming permanent resident.  Lives with boyfriend.  [place] living [place]- no other family in [place].  [employer]    Family History: History reviewed. No pertinent family history.  Allergies: No Known Allergies  Medications Prior to Admission  Medication Sig Dispense Refill Last Dose  . Accu-Chek FastClix Lancets MISC 1 Stick by Percutaneous route 4 (four) times daily. 100 each 12   . Blood Glucose Monitoring Suppl (ACCU-CHEK GUIDE) w/Device KIT 1 Device by Percutaneous route 4 (four) times daily. 1 kit 0   . cyclobenzaprine (FLEXERIL) 10 MG tablet Take 1 tablet (10 mg total) by mouth 2 (two) times daily as needed for muscle spasms. 10 tablet 0   . famotidine (PEPCID) 20 MG tablet Take 1 tablet (20 mg total) by mouth 2 (two) times daily. 60 tablet 2   . glucose blood (ACCU-CHEK GUIDE) test strip Use to check blood sugars four times a day as instructed 50 each 12   . metFORMIN (GLUCOPHAGE) 500 MG tablet Take 2 tablets (1,000 mg total) by mouth 2 (two) times daily with a meal. 90 tablet 5   . omeprazole (PRILOSEC) 40 MG capsule Take 1 capsule (40 mg total) by mouth daily. 30 capsule 0   . Prenat-FeAsp-Meth-FA-DHA w/o A (PRENATE PIXIE) 10-0.6-0.4-200 MG CAPS Take 1 tablet by mouth daily. 30 capsule 12      Review of Systems  All systems reviewed and negative except as stated in HPI  Last menstrual period 12/22/2017. General appearance: alert, cooperative and no distress Lungs: no respiratory distress Heart: regular rate  Abdomen: soft, non-tender; gravid  Pelvic: Adequate for vaginal delivery  Extremities: Homans sign is negative, no sign of DVT Presentation: cephalic Fetal monitoring: 145 bpm / moderate variability / accels present / decels absent  Uterine activity: irregular every 8-10 mins   Dilation: 5 Effacement (%): 70 Station: -3 Exam by:: r Martavion Couper, cnm   Prenatal labs: ABO, Rh: --/--/PENDING (08/10 2024) Antibody: PENDING (08/10 2024) Rubella: 8.22 (02/19 1535) RPR: Non Reactive (07/28 1218)  HBsAg: Negative (07/28 0347)  HIV: Non Reactive (06/11 0914)  GBS:  Pending Glucola: Abnormal [phone] Genetic screening:  Normal  Prenatal Transfer  Tool  Maternal Diabetes: Yes:  Diabetes Type:  Diet controlled Genetic Screening: Normal Maternal Ultrasounds/Referrals: Normal Fetal Ultrasounds or other Referrals:  None Maternal Substance Abuse:  No Significant Maternal Medications:  None Significant Maternal Lab Results: Other: GBS unknown  Results for orders placed or performed during the hospital encounter of 09/08/18 (from the past 24 hour(s))  SARS Coronavirus 2 [facility] order, Performed in [place] hospital lab) Nasopharyngeal Nasopharyngeal Swab   Collection Time: 09/08/18  7:40 PM   Specimen: Nasopharyngeal Swab  Result Value Ref Range   SARS Coronavirus 2 NEGATIVE NEGATIVE  CBC   Collection Time: 09/08/18  8:20 PM  Result Value Ref Range   WBC 7.1 4.0 - 10.5 K/uL   RBC 3.94 3.87 - 5.11 MIL/uL   Hemoglobin 12.0 12.0 - 15.0 g/dL   HCT 35.3 (L) 36.0 - 46.0 %   MCV 89.6 80.0 - 100.0 fL   MCH 30.5 26.0 - 34.0 pg   MCHC 34.0 30.0 - 36.0 g/dL   RDW 13.2 11.5 - 15.5 %   Platelets 171 150 - 400 K/uL   nRBC 0.0 0.0 - 0.2 %  Type and screen [place]   Collection Time: 09/08/18  8:24 PM  Result Value Ref Range   ABO/RH(D) PENDING    Antibody Screen PENDING    Sample Expiration      09/11/2018,2359 Performed at [facility], [place]. [address] [place], [place] [address]     Patient Active Problem List   Diagnosis Date Noted  . Indication for care in labor or delivery 09/08/2018  . Cholelithiasis affecting pregnancy in third trimester, antepartum 08/29/2018  . Threatened premature labor in third trimester 08/29/2018  . Preterm  labor 08/26/2018  . Gestational diabetes 07/16/2018  . E. coli UTI (urinary tract infection) 03/26/2018  . Supervision of other normal pregnancy, antepartum 03/19/2018  . Rh negative status during pregnancy 02/08/2016  . Language barrier 01/02/2016  . Hearing loss 05/01/2011    Assessment/Plan:  MARKELLA DAO is a 28 y.o. G3P1011 at 75w1dhere for IOL d/t non-reassuring fetal testing, BPP 4/8  GDM A1  Labor:  -- pain control: well-tolerated at this time  -- may consider epidural  Fetal Wellbeing: EFW 7.5 lbs by Leopold's. Cephalic by SVE.  -- GBS (pending) -- continuous fetal monitoring - category 1   Postpartum Planning -- breast -- unsure on contraceptive choice -- _0  Tdap (07/10/2018)   RLaury Deep CNM  09/08/2018, 8:59 PM

## 2018-09-09 ENCOUNTER — Encounter (HOSPITAL_COMMUNITY): Payer: Self-pay

## 2018-09-09 DIAGNOSIS — O24420 Gestational diabetes mellitus in childbirth, diet controlled: Secondary | ICD-10-CM

## 2018-09-09 DIAGNOSIS — Z3A37 37 weeks gestation of pregnancy: Secondary | ICD-10-CM

## 2018-09-09 LAB — RPR: RPR Ser Ql: NONREACTIVE

## 2018-09-09 LAB — GLUCOSE, CAPILLARY: Glucose-Capillary: 82 mg/dL (ref 70–99)

## 2018-09-09 MED ORDER — IBUPROFEN 600 MG PO TABS
600.0000 mg | ORAL_TABLET | Freq: Four times a day (QID) | ORAL | Status: DC
  Administered 2018-09-09 – 2018-09-11 (×9): 600 mg via ORAL
  Filled 2018-09-09 (×8): qty 1

## 2018-09-09 MED ORDER — ZOLPIDEM TARTRATE 5 MG PO TABS: 5.0000 mg | ORAL_TABLET | Freq: Every evening | ORAL | Status: DC | PRN

## 2018-09-09 MED ORDER — COCONUT OIL OIL: 1.0000 "application " | TOPICAL_OIL | Status: DC | PRN

## 2018-09-09 MED ORDER — PRENATAL MULTIVITAMIN CH
1.0000 | ORAL_TABLET | Freq: Every day | ORAL | Status: DC
  Administered 2018-09-09 – 2018-09-10 (×2): 1 via ORAL
  Filled 2018-09-09: qty 1

## 2018-09-09 MED ORDER — SIMETHICONE 80 MG PO CHEW: 80.0000 mg | CHEWABLE_TABLET | ORAL | Status: DC | PRN

## 2018-09-09 MED ORDER — DIBUCAINE (PERIANAL) 1 % EX OINT: 1.0000 "application " | TOPICAL_OINTMENT | CUTANEOUS | Status: DC | PRN

## 2018-09-09 MED ORDER — ONDANSETRON HCL 4 MG/2ML IJ SOLN: 4.0000 mg | INTRAMUSCULAR | Status: DC | PRN

## 2018-09-09 MED ORDER — MISOPROSTOL 200 MCG PO TABS
ORAL_TABLET | ORAL | Status: AC
  Filled 2018-09-09: qty 4

## 2018-09-09 MED ORDER — FENTANYL CITRATE (PF) 100 MCG/2ML IJ SOLN
100.0000 ug | INTRAMUSCULAR | Status: DC | PRN
  Administered 2018-09-09 (×2): 100 ug via INTRAVENOUS
  Filled 2018-09-09 (×2): qty 2

## 2018-09-09 MED ORDER — BENZOCAINE-MENTHOL 20-0.5 % EX AERO
1.0000 "application " | INHALATION_SPRAY | CUTANEOUS | Status: DC | PRN
  Filled 2018-09-09: qty 56

## 2018-09-09 MED ORDER — ACETAMINOPHEN 325 MG PO TABS
650.0000 mg | ORAL_TABLET | ORAL | Status: DC | PRN
  Filled 2018-09-09: qty 2

## 2018-09-09 MED ORDER — DIPHENHYDRAMINE HCL 25 MG PO CAPS: 25.0000 mg | ORAL_CAPSULE | Freq: Four times a day (QID) | ORAL | Status: DC | PRN

## 2018-09-09 MED ORDER — WITCH HAZEL-GLYCERIN EX PADS: 1.0000 "application " | MEDICATED_PAD | CUTANEOUS | Status: DC | PRN

## 2018-09-09 MED ORDER — SENNOSIDES-DOCUSATE SODIUM 8.6-50 MG PO TABS
2.0000 | ORAL_TABLET | ORAL | Status: DC
  Administered 2018-09-09 – 2018-09-10 (×3): 2 via ORAL
  Filled 2018-09-09 (×3): qty 2

## 2018-09-09 MED ORDER — ONDANSETRON HCL 4 MG PO TABS
4.0000 mg | ORAL_TABLET | ORAL | Status: DC | PRN
  Administered 2018-09-10: 4 mg via ORAL
  Filled 2018-09-09: qty 1

## 2018-09-09 NOTE — Progress Notes (Addendum)
Subjective: Heather Savage is a 28 y.o. G3P1011 at [redacted]w[redacted]d by LMP admitted for induction of labor due to non-reassuring fetal testing (BPP 4/8) and GDM-A1.  Objective: BP 129/77   Pulse 80   Temp 98.2 F (36.8 C) (Oral)   Resp 16   Ht 5\' 3"  (1.6 m)   Wt 71 kg   LMP 12/22/2017 (Exact Date)   BMI 27.72 kg/m  No intake/output data recorded. No intake/output data recorded.  FHT:  FHR: 130 bpm, variability: moderate,  accelerations:  Present,  decelerations:  Present variable UC:   regular, every 1-4 minutes SVE:   Dilation: 7 Effacement (%): 70 Station: -2, -1 Exam by:: Nozomi Mettler, cnm  AROM with large amounts of pink tinged fluid in return  Labs: Lab Results  Component Value Date   WBC 7.1 09/08/2018   HGB 12.0 09/08/2018   HCT 35.3 (L) 09/08/2018   MCV 89.6 09/08/2018   PLT 171 09/08/2018    Assessment / Plan: Induction of labor due to non-reassuring fetal testing,  progressing well on pitocin  GBS Negative  Labor: Progressing on Pitocin, will continue to increase then AROM Preeclampsia:  n/a Fetal Wellbeing:  Category I Pain Control:  Labor support without medications -- requesting IV pain medication I/D:  n/a Anticipated MOD:  NSVD  Laury Deep, MSN, CNM 09/09/2018, 1:20 AM

## 2018-09-09 NOTE — Discharge Summary (Signed)
Postpartum Discharge Summary     Patient Name: Heather Savage DOB: 1990/08/08 MRN: [id number]  Date of admission: 09/08/2018 Delivering Provider:    Liliann, File [[id number]]      Amoroso, Girl Stann Ore [[id number]]  Laury Deep    Date of discharge: 09/11/2018  Admitting diagnosis: induction Intrauterine pregnancy: [redacted]w[redacted]d    Secondary diagnosis:  Active Problems:   Language barrier   Rh negative status during pregnancy   Gestational diabetes   Cholelithiasis affecting pregnancy in third trimester, antepartum   Indication for care in labor or delivery  Additional problems: Gestational Diabetes class A1,    Cholelithiasis     Discharge diagnosis: Term Pregnancy Delivered and Cholelithiasis                                                                                                Post partum procedures:N/A  Augmentation: AROM and Pitocin  Complications: None  Hospital course:  Induction of Labor With Vaginal Delivery   28y.o. yo G3P1011 at 352w2das admitted to the hospital 09/08/2018 for induction of labor.  Indication for induction: A1 DM and Cholelithiasis.  Patient had an uncomplicated labor course as follows: Membrane Rupture Time/Date:    GeAlese, Furniss0[[id number]]    GeGicela, Schwartingirl FiPoint0[[id number]]1:20 AM  ,   GeSadae, Arrazola0[[id number]]    GeJordanna, Hendrieirl FiBranae0[[id number]]09/09/2018    Intrapartum Procedures: Episiotomy:    GeGracyn, Allor0[[id number]]    GeHuntley, Demedeirosirl FiLoraine0[[id number]]None [1]                                          Lacerations:     GeTekela, Garguilo0[[id number]]    GeAnaika, Santillanoirl FiKalaya0[[id number]]Periurethral [8]   Patient had delivery of a Viable infant.  Information for the patient's newborn:  GePleshette, Tomasini0[[id number]]  Information for the patient's newborn:  GeBlessin, Kanno0[[id number]]Delivery Method:  VaRania, Prothero0[[id number]]   GeBrittony, Billickirl FiRuberta0[[id number]]09/09/2018   Details of delivery can be found in separate delivery note.  Patient had a postpartum course complicated by acute RUQ pain. Occurred within an hour of eating a cheeseburger on PPD#1. Lipase, WBC, LFTs were within normal limits. Patient was afebrile and not vomiting. Suspect acute gallbladder pain attack as etiology. Counseling provided on diet changes and Rx sent for Oxycodone. Have placed referral for general surgery outpatient. Patient is discharged home 09/11/18.  Magnesium Sulfate received: No BMZ received: No  Physical exam  Vitals:   09/10/18 0614 09/10/18 1523 09/10/18 2129 09/11/18 0530  BP: 111/76 101/69 113/69 118/72  Pulse: 66 69 76 82  Resp: 18 17 17 16   Temp: 97.8 F (36.6 C) 98.1 F (36.7 C) 98.2 F (36.8 C) 98.1 F (36.7 C)  TempSrc: Oral Oral Oral Oral  SpO2: 100% 100%    Weight:  Height:       General: alert, cooperative and no distress Lochia: appropriate Uterine Fundus: firm Incision: N/A DVT Evaluation: No evidence of DVT seen on physical exam. Negative Homan's sign. Labs: Lab Results  Component Value Date   WBC 8.5 09/10/2018   HGB 9.8 (L) 09/10/2018   HCT 29.5 (L) 09/10/2018   MCV 91.9 09/10/2018   PLT 176 09/10/2018   CMP Latest Ref Rng & Units 09/10/2018  Glucose 70 - 99 mg/dL 121(H)  BUN 6 - 20 mg/dL 8  Creatinine 0.44 - 1.00 mg/dL 0.64  Sodium 135 - 145 mmol/L 138  Potassium 3.5 - 5.1 mmol/L 3.5  Chloride 98 - 111 mmol/L 106  CO2 22 - 32 mmol/L 23  Calcium 8.9 - 10.3 mg/dL 8.7(L)  Total Protein 6.5 - 8.1 g/dL 5.0(L)  Total Bilirubin 0.3 - 1.2 mg/dL 0.3  Alkaline Phos 38 - 126 U/L 119  AST 15 - 41 U/L 33  ALT 0 - 44 U/L 30    Discharge instruction: per After Visit Summary and "Baby and Me Booklet".  After visit meds:  Allergies as of 09/11/2018   No Known Allergies     Medication List    STOP taking these medications    metFORMIN 500 MG tablet Commonly known as: GLUCOPHAGE     TAKE these medications   Accu-Chek FastClix Lancets Misc 1 Stick by Percutaneous route 4 (four) times daily.   Accu-Chek Guide test strip Generic drug: glucose blood Use to check blood sugars four times a day as instructed   Accu-Chek Guide w/Device Kit 1 Device by Percutaneous route 4 (four) times daily.   cyclobenzaprine 10 MG tablet Commonly known as: FLEXERIL Take 1 tablet (10 mg total) by mouth 2 (two) times daily as needed for muscle spasms.   famotidine 20 MG tablet Commonly known as: PEPCID Take 1 tablet (20 mg total) by mouth 2 (two) times daily.   ibuprofen 600 MG tablet Commonly known as: ADVIL Take 1 tablet (600 mg total) by mouth every 6 (six) hours.   omeprazole 40 MG capsule Commonly known as: PRILOSEC Take 1 capsule (40 mg total) by mouth daily.   oxyCODONE-acetaminophen 5-325 MG tablet Commonly known as: PERCOCET/ROXICET Take 1 tablet by mouth every 4 (four) hours as needed for severe pain.   Prenate Pixie 10-0.6-0.4-200 MG Caps Take 1 tablet by mouth daily.   senna-docusate 8.6-50 MG tablet Commonly known as: Senokot-S Take 2 tablets by mouth daily. Start taking on: September 12, 2018       Diet: routine diet  Activity: Advance as tolerated. Pelvic rest for 6 weeks.   Outpatient follow up:4 weeks Follow up Appt: Future Appointments  Date Time Provider [place]  10/08/2018  9:00 AM Constant, Vickii Chafe, MD [place] None  10/14/2018  8:30 AM [facility] None   Follow up Visit:   Please schedule this patient for Postpartum visit in: 6 weeks with the following provider: Any provider For C/S patients schedule nurse incision check in weeks 2 weeks: no Low risk pregnancy complicated by: GDM Delivery mode:  SVD Anticipated Birth Control:  other/unsure PP Procedures needed: n/a  Schedule Integrated [place] visit: no   Newborn Data:    Kashlynn, Kundert [[id number]]   Live born female  Birth Weight:  3140g APGAR: 9, 9  Newborn Delivery   Birth date/time: 09/09/2018 04:04:00 Delivery type: Vaginal, Spontaneous      Baby Feeding: Breast Disposition:home with mother   09/11/2018 Melina Schools, DO

## 2018-09-10 ENCOUNTER — Encounter: Payer: Medicaid Other | Admitting: Obstetrics

## 2018-09-10 LAB — COMPREHENSIVE METABOLIC PANEL
ALT: 30 U/L (ref 0–44)
ALT: 30 U/L (ref 0–44)
AST: 34 U/L (ref 15–41)
AST: 33 U/L (ref 15–41)
Albumin: 2.4 g/dL — ABNORMAL LOW (ref 3.5–5.0)
Albumin: 2.4 g/dL — ABNORMAL LOW (ref 3.5–5.0)
Alkaline Phosphatase: 122 U/L (ref 38–126)
Alkaline Phosphatase: 119 U/L (ref 38–126)
Anion gap: 11 (ref 5–15)
Anion gap: 9 (ref 5–15)
BUN: 8 mg/dL (ref 6–20)
BUN: 8 mg/dL (ref 6–20)
CO2: 19 mmol/L — ABNORMAL LOW (ref 22–32)
CO2: 23 mmol/L (ref 22–32)
Calcium: 8.9 mg/dL (ref 8.9–10.3)
Calcium: 8.7 mg/dL — ABNORMAL LOW (ref 8.9–10.3)
Chloride: 107 mmol/L (ref 98–111)
Chloride: 106 mmol/L (ref 98–111)
Creatinine, Ser: 0.59 mg/dL (ref 0.44–1.00)
Creatinine, Ser: 0.64 mg/dL (ref 0.44–1.00)
GFR calc Af Amer: >60 mL/min (ref >60)
GFR calc Af Amer: >60 mL/min (ref >60)
GFR calc non Af Amer: >60 mL/min (ref >60)
GFR calc non Af Amer: >60 mL/min (ref >60)
Glucose, Bld: 117 mg/dL — ABNORMAL HIGH (ref 70–99)
Glucose, Bld: 121 mg/dL — ABNORMAL HIGH (ref 70–99)
Potassium: 3.5 mmol/L (ref 3.5–5.1)
Potassium: 3.5 mmol/L (ref 3.5–5.1)
Sodium: 137 mmol/L (ref 135–145)
Sodium: 138 mmol/L (ref 135–145)
Total Bilirubin: 0.5 mg/dL (ref 0.3–1.2)
Total Bilirubin: 0.3 mg/dL (ref 0.3–1.2)
Total Protein: 5.2 g/dL — ABNORMAL LOW (ref 6.5–8.1)
Total Protein: 5 g/dL — ABNORMAL LOW (ref 6.5–8.1)

## 2018-09-10 LAB — CBC WITH DIFFERENTIAL/PLATELET
Abs Immature Granulocytes: 0.06 10*3/uL (ref 0.00–0.07)
Basophils Absolute: 0 10*3/uL (ref 0.0–0.1)
Basophils Relative: 0 %
Eosinophils Absolute: 0 10*3/uL (ref 0.0–0.5)
Eosinophils Relative: 1 %
HCT: 29.5 % — ABNORMAL LOW (ref 36.0–46.0)
Hemoglobin: 9.8 g/dL — ABNORMAL LOW (ref 12.0–15.0)
Immature Granulocytes: 1 %
Lymphocytes Relative: 19 %
Lymphs Abs: 1.6 10*3/uL (ref 0.7–4.0)
MCH: 30.5 pg (ref 26.0–34.0)
MCHC: 33.2 g/dL (ref 30.0–36.0)
MCV: 91.9 fL (ref 80.0–100.0)
Monocytes Absolute: 0.5 10*3/uL (ref 0.1–1.0)
Monocytes Relative: 6 %
Neutro Abs: 6.2 10*3/uL (ref 1.7–7.7)
Neutrophils Relative %: 73 %
Platelets: 176 10*3/uL (ref 150–400)
RBC: 3.21 MIL/uL — ABNORMAL LOW (ref 3.87–5.11)
RDW: 13.3 % (ref 11.5–15.5)
WBC: 8.5 10*3/uL (ref 4.0–10.5)
nRBC: 0 % (ref 0.0–0.2)

## 2018-09-10 LAB — LIPASE, BLOOD: Lipase: 41 U/L (ref 11–51)

## 2018-09-10 MED ORDER — LACTATED RINGERS IV BOLUS
1000.0000 mL | Freq: Once | INTRAVENOUS | Status: AC
  Administered 2018-09-10: 1000 mL via INTRAVENOUS

## 2018-09-10 MED ORDER — HYDROMORPHONE HCL 1 MG/ML IJ SOLN
1.0000 mg | Freq: Once | INTRAMUSCULAR | Status: AC
  Administered 2018-09-10: 1 mg via INTRAVENOUS
  Filled 2018-09-10: qty 1

## 2018-09-10 MED ORDER — OXYCODONE-ACETAMINOPHEN 5-325 MG PO TABS
1.0000 | ORAL_TABLET | ORAL | Status: DC | PRN
  Administered 2018-09-10: 2 via ORAL
  Administered 2018-09-10: 1 via ORAL
  Filled 2018-09-10: qty 2
  Filled 2018-09-10: qty 1

## 2018-09-10 MED ORDER — CALCIUM CARBONATE ANTACID 500 MG PO CHEW
2.0000 | CHEWABLE_TABLET | Freq: Two times a day (BID) | ORAL | Status: DC | PRN
  Administered 2018-09-10: 400 mg via ORAL
  Filled 2018-09-10: qty 2

## 2018-09-10 NOTE — Lactation Note (Signed)
This note was copied from a baby's chart. Lactation Consultation Note  Patient Name: Heather Savage [id number] Date: 09/10/2018   P2, 57 hour female infant. Mom speaks Amharic LC was about to enter room with [place] interpreter when Nurse informed LC now wasn't a good time mom was having a "flair up" and couldn't be seen by [facility] services at this time. .    Maternal Data    Feeding Feeding Type: Bottle Fed - Formula Nipple Type: Slow - flow  LATCH Score Latch: Repeated attempts needed to sustain latch, nipple held in mouth throughout feeding, stimulation needed to elicit sucking reflex.  Audible Swallowing: None  Type of Nipple: Everted at rest and after stimulation  Comfort (Breast/Nipple): Soft / non-tender  Hold (Positioning): No assistance needed to correctly position infant at breast.  LATCH Score: 7  Interventions    Lactation Tools Discussed/Used     Consult Status      Vicente Serene 09/10/2018, 3:18 AM

## 2018-09-10 NOTE — Progress Notes (Signed)
Patient called out with 10/10 pain under breast area, husband who interprets for her signed interpreter form, stated that this is the second time this has happened since she gave birth. Patient is pacing the room. Provided patient with 10 mg oxycodone for pain relief. Patient requesting to be evaluated by doctor. Physician notified.

## 2018-09-10 NOTE — Progress Notes (Signed)
Lab results reviewed with Dr. Elonda Husky. Patient does not meet criteria for inpatient surgical consult at this time.   Mallie Snooks, MSN, CNM Certified Nurse Midwife, Faculty [facility] 09/10/18 9:50 PM

## 2018-09-10 NOTE — Progress Notes (Signed)
Post Partum Day 1 Subjective: no complaints, up ad lib, voiding and tolerating PO. Had RUQ pain last night, improved with pain medicine, better now.  Objective: Blood pressure 111/76, pulse 66, temperature 97.8 F (36.6 C), temperature source Oral, resp. rate 18, height 5\' 3"  (1.6 m), weight 71 kg, last menstrual period 12/22/2017, SpO2 100 %, unknown if currently breastfeeding.  Physical Exam:  General: alert, cooperative and no distress Lochia: appropriate Uterine Fundus: firm DVT Evaluation: No evidence of DVT seen on physical exam. Negative Homan's sign. No cords or calf tenderness. No significant calf/ankle edema.  Recent Labs    09/08/18 2020  HGB 12.0  HCT 35.3*    Assessment/Plan: Plan for discharge tomorrow  Will need OP f/u with Gen surg for cholelithiasis CMP normal - no evidence of on going obstruction.   LOS: 2 days   Truett Mainland 09/10/2018, 6:35 AM

## 2018-09-10 NOTE — Progress Notes (Signed)
Subjective: I was called to bedside to evaluate sudden onset chest pain.  Heather Savage is a 28 yo female PPD#1 from a vaginal delivery. She is having chest pain that started roughly at 7:50 PM. It is tight and achy in nature, and feels like pressure, especially on the right side. IT is reproducible to palpation, and her husband states that she doesn't want anything touching her rib cage because of the pain. She does have some mild nausea, but is able to tolerate liquids. She does not remember what she has had to eat today. She is passing gas.   She was in [facility] last week for a similar pain, ultrasound done at that time showed cholelithiasis, and there is a plan in place for an outpatient surgery referral.  She denies cough, fevers, palpitations, chest pain radiating down her left arm, or radiation into her back.  Objective: General: Incredibly uncomfortable-looking female, pacing and clutching her rib cage Eyes: sclera anicteric Neck: Supple, normal ROM Chest: Breathing regular and unlabored, though breaths are shallow. Pain to palpation of the lateral and right anterior chest wall. Abdomen: Soft, no tympany to percussion, no guarding or rigidity, moderate/severe pain to palpation of the right upper quadrant. Positive Murphy's sign on exam Back: No CVA tenderness  Assessment: 28 y.o. female with sudden onset right-sided pain, most likely related to known gallstones.  Plan: STAT CMP, CBC, Lipase to r/o obstruction, pancreatitis Consider CT AB/Pelvis with IV contrast if LFTs elevated Outpatient General Surgery consultation for cholecystectomy Night Provider made aware

## 2018-09-11 ENCOUNTER — Other Ambulatory Visit: Payer: Self-pay | Admitting: Internal Medicine

## 2018-09-11 DIAGNOSIS — K8020 Calculus of gallbladder without cholecystitis without obstruction: Secondary | ICD-10-CM

## 2018-09-11 MED ORDER — IBUPROFEN 600 MG PO TABS: 600.0000 mg | ORAL_TABLET | Freq: Four times a day (QID) | ORAL | 1 refills | Status: DC

## 2018-09-11 MED ORDER — SENNOSIDES-DOCUSATE SODIUM 8.6-50 MG PO TABS: 2.0000 | ORAL_TABLET | ORAL | 0 refills | Status: DC

## 2018-09-11 MED ORDER — OXYCODONE-ACETAMINOPHEN 5-325 MG PO TABS: 1.0000 | ORAL_TABLET | ORAL | 0 refills | Status: DC | PRN

## 2018-09-11 NOTE — Lactation Note (Signed)
This note was copied from a baby's chart. Lactation Consultation Note:  Infant is 30 hours old. She is 37.[redacted] weeks gestation. FOB present at the bedside for all teaching. Mother is a P2, she breastfed her first child for 2 yrs.  Mother reports that she is hearing infant swallow. She denies having any nipple tenderness when infant is feeding.  Mother denies having had history of Mastitis.  Mother was given Lactation brochure and reviewed basics.   Advised mother to continue to cue base feed and to feed infant at least 8-12 times or more in 24 hours. Discussed cluster feeding. Fob reports that infant has stopped sleeping. Infant formed him that wakeful moments are more frequent and that infant will continue to cluster feed up to 5 nights.  Mother advised to continue to breastfeed infant and then to offer supplement of ebm or formula. Mother has supplemental guidelines at the bedside. She has declined to pump . Fob reports that they are going to use formula for supplementing.  Mother was offered a harmony hand pump. She declined at first and then reports that she would like a pump for use a home.  Mother advised in S/S of Mastitis . She also was advised in treatment and prevention of engorgement.  Mother receptive to all teaching and denies having any breastfeeding questions or concerns. Informed family of available [place] services and community support.  Patient Name: Heather Savage [id number] Date: 09/11/2018 Reason for consult: Initial assessment   Maternal Data Has patient been taught Hand Expression?: Yes Does the patient have breastfeeding experience prior to this delivery?: Yes  Feeding Feeding Type: Breast Fed  LATCH Score                   Interventions    Lactation Tools Discussed/Used     Consult Status Consult Status: Follow-up Date: 09/12/18 Follow-up type: In-patient    Jess Barters [facility] 09/11/2018, 8:58 AM

## 2018-09-11 NOTE — Plan of Care (Signed)
Pt and husband received all discharge paper work and both are receptive.  Pt has been comfortable for my entire shift, without complaints of right upper abdominal pain. Pt has follow up with [facility] and has the address and phone number to reach out to them if they haven't heard from them within three days.

## 2018-09-11 NOTE — Progress Notes (Signed)
Placed order for general surgery referral due to symptomatic cholelithiasis.   Phill Myron, D.O. [facility] Family Medicine Fellow, [facility] for [facility], [facility] 09/11/2018, 10:40 AM

## 2018-09-11 NOTE — Progress Notes (Signed)
CSW aware of consult due to concerns for MOB being in intense pain and not seeming to be able to care for infant while in pain. It was also noted that FOB appeared to be overwhelmed with caring for baby and they have a 28 year old son at home. CSW met with MOB and FOB at bedside to offer support and assess for needs. CSW informed MOB speaks Amharic but has difficulty hearing interpreter so FOB provides interpretation. MOB was sitting up in bed holding infant with FOB present at bedside, when CSW entered the room. MOB and FOB both appeared to be appropriate with and attentive to infant throughout visit. CSW inquired about how MOB and FOB were feeling. FOB shared that he has concerns for MOB as she had a gallbladder attack the night prior and was in a lot of pain. FOB reported last night was MOB's 3rd attack since being diagnosed. CSW inquired about how FOB and MOB were feeling with taking infant home and having a 28-year-old in the home as well. FOB did not seem concerned and said they would be taken care of. FOB shared his main concern was for MOB and when they could get an appointment. CSW validated FOB's concerns and processed with him how they would handle going home if an appointment wasn't able to be scheduled until next week. FOB hopeful that something would come available sooner but stated, if not, he would want to ensure MOB had medicine to go home with. CSW again inquired about any concerns for caring for infant and toddler once home and FOB denied any. FOB reported they had all necessary items for infant once discharged. CSW inquired about MOB and FOB's interest in being referred for programs that could offer additional support once home to which FOB was receptive. CSW to make [facility] referral. FOB denied any further questions, concerns or need for resources from CSW at this time. CSW relayed information to RN who reported she would update Pediatrician.  Please reconsult CSW if further needs  arise.  Yarisa Lynam, LCSWA  [facility] [phone]  

## 2018-09-11 NOTE — Discharge Instructions (Signed)
Cholelithiasis  Cholelithiasis is a form of gallbladder disease in which gallstones form in the gallbladder. The gallbladder is an organ that stores bile. Bile is made in the liver, and it helps to digest fats. Gallstones begin as small crystals and slowly grow into stones. They may cause no symptoms until the gallbladder tightens (contracts) and a gallstone is blocking the duct (gallbladder attack), which can cause pain. Cholelithiasis is also referred to as gallstones. There are two main types of gallstones:  Cholesterol stones. These are made of hardened cholesterol and are usually yellow-green in color. They are the most common type of gallstone. Cholesterol is a white, waxy, fat-like substance that is made in the liver.  Pigment stones. These are dark in color and are made of a red-yellow substance that forms when hemoglobin from red blood cells breaks down (bilirubin). What are the causes? This condition may be caused by an imbalance in the substances that bile is made of. This can happen if the bile:  Has too much bilirubin.  Has too much cholesterol.  Does not have enough bile salts. These salts help the body absorb and digest fats. In some cases, this condition can also be caused by the gallbladder not emptying completely or often enough. What increases the risk? The following factors may make you more likely to develop this condition:  Being female.  Having multiple pregnancies. Health care providers sometimes advise removing diseased gallbladders before future pregnancies.  Eating a diet that is heavy in fried foods, fat, and refined carbohydrates, like white bread and white rice.  Being obese.  Being older than age 28.  Prolonged use of medicines that contain female hormones (estrogen).  Having diabetes mellitus.  Rapidly losing weight.  Having a family history of gallstones.  Being of American [place] or [place] descent.  Having an intestinal disease such as  Crohn disease.  Having metabolic syndrome.  Having cirrhosis.  Having severe types of anemia such as sickle cell anemia. What are the signs or symptoms? In most cases, there are no symptoms. These are known as silent gallstones. If a gallstone blocks the bile ducts, it can cause a gallbladder attack. The main symptom of a gallbladder attack is sudden pain in the upper right abdomen. The pain usually comes at night or after eating a large meal. The pain can last for one or several hours and can spread to the right shoulder or chest. If the bile duct is blocked for more than a few hours, it can cause infection or inflammation of the gallbladder, liver, or pancreas, which may cause:  Nausea.  Vomiting.  Abdominal pain that lasts for 5 hours or more.  Fever or chills.  Yellowing of the skin or the whites of the eyes (jaundice).  Dark urine.  Light-colored stools. How is this diagnosed? This condition may be diagnosed based on:  A physical exam.  Your medical history.  An ultrasound of your gallbladder.  CT scan.  MRI.  Blood tests to check for signs of infection or inflammation.  A scan of your gallbladder and bile ducts (biliary system) using nonharmful radioactive material and special cameras that can see the radioactive material (cholescintigram). This test checks to see how your gallbladder contracts and whether bile ducts are blocked.  Inserting a small tube with a camera on the end (endoscope) through your mouth to inspect bile ducts and check for blockages (endoscopic retrograde cholangiopancreatogram). How is this treated? Treatment for gallstones depends on the severity of  the condition. Silent gallstones do not need treatment. If the gallstones cause a gallbladder attack or other symptoms, treatment may be required. Options for treatment include:  Surgery to remove the gallbladder (cholecystectomy). This is the most common treatment.  Medicines to dissolve  gallstones. These are most effective at treating small gallstones. You may need to take medicines for up to 6-12 months.  Shock wave treatment (extracorporeal biliary lithotripsy). In this treatment, an ultrasound machine sends shock waves to the gallbladder to break gallstones into smaller pieces. These pieces can then be passed into the intestines or be dissolved by medicine. This is rarely used.  Removing gallstones through endoscopic retrograde cholangiopancreatogram. A small basket can be attached to the endoscope and used to capture and remove gallstones. Follow these instructions at home:  Take over-the-counter and prescription medicines only as told by your health care provider.  Maintain a healthy weight and follow a healthy diet. This includes: ? Reducing fatty foods, such as fried food. ? Reducing refined carbohydrates, like white bread and white rice. ? Increasing fiber. Aim for foods like almonds, fruit, and beans.  Keep all follow-up visits as told by your health care provider. This is important. Contact a health care provider if:  You think you have had a gallbladder attack.  You have been diagnosed with silent gallstones and you develop abdominal pain or indigestion. Get help right away if:  You have pain from a gallbladder attack that lasts for more than 2 hours.  You have abdominal pain that lasts for more than 5 hours.  You have a fever or chills.  You have persistent nausea and vomiting.  You develop jaundice.  You have dark urine or light-colored stools. Summary  Cholelithiasis (also called gallstones) is a form of gallbladder disease in which gallstones form in the gallbladder.  This condition is caused by an imbalance in the substances that make up bile. This can happen if the bile has too much cholesterol, too much bilirubin, or not enough bile salts.  You are more likely to develop this condition if you are female, pregnant, using medicines with  estrogen, obese, older than age 27, or have a family history of gallstones. You may also develop gallstones if you have diabetes, an intestinal disease, cirrhosis, or metabolic syndrome.  Treatment for gallstones depends on the severity of the condition. Silent gallstones do not need treatment.  If gallstones cause a gallbladder attack or other symptoms, treatment may be needed. The most common treatment is surgery to remove the gallbladder. This information is not intended to replace advice given to you by your health care provider. Make sure you discuss any questions you have with your health care provider. Document Released: 01/11/2005 Document Revised: 12/28/2016 Document Reviewed: 10/02/2015 Elsevier Patient Education  2020 [place] After Refer to this sheet in the next few weeks. These discharge instructions provide you with information on caring for yourself after delivery. Your caregiver may also give you specific instructions. Your treatment has been planned according to the most current medical practices available, but problems sometimes occur. Call your caregiver if you have any problems or questions after you go home. HOME CARE INSTRUCTIONS  Take over-the-counter or prescription medicines only as directed by your caregiver or pharmacist.  Do not drink alcohol, especially if you are breastfeeding or taking medicine to relieve pain.  Do not chew or smoke tobacco.  Do not use illegal drugs.  Continue to use good perineal care. Good perineal care includes:  Wiping your perineum from front to back.  Keeping your perineum clean.  Do not use tampons or douche until your caregiver says it is okay.  Shower, wash your hair, and take tub baths as directed by your caregiver.  Wear a well-fitting bra that provides breast support.  Eat healthy foods.  Drink enough fluids to keep your urine clear or pale yellow.  Eat high-fiber foods such as whole grain  cereals and breads, brown rice, beans, and fresh fruits and vegetables every day. These foods may help prevent or relieve constipation.  Follow your cargiver's recommendations regarding resumption of activities such as climbing stairs, driving, lifting, exercising, or traveling.  Talk to your caregiver about resuming sexual activities. Resumption of sexual activities is dependent upon your risk of infection, your rate of healing, and your comfort and desire to resume sexual activity.  Try to have someone help you with your household activities and your newborn for at least a few days after you leave the hospital.  Rest as much as possible. Try to rest or take a nap when your newborn is sleeping.  Increase your activities gradually.  Keep all of your scheduled postpartum appointments. It is very important to keep your scheduled follow-up appointments. At these appointments, your caregiver will be checking to make sure that you are healing physically and emotionally. SEEK MEDICAL CARE IF:   You are passing large clots from your vagina. Save any clots to show your caregiver.  You have a foul smelling discharge from your vagina.  You have trouble urinating.  You are urinating frequently.  You have pain when you urinate.  You have a change in your bowel movements.  You have increasing redness, pain, or swelling near your vaginal incision (episiotomy) or vaginal tear.  You have pus draining from your episiotomy or vaginal tear.  Your episiotomy or vaginal tear is separating.  You have painful, hard, or reddened breasts.  You have a severe headache.  You have blurred vision or see spots.  You feel sad or depressed.  You have thoughts of hurting yourself or your newborn.  You have questions about your care, the care of your newborn, or medicines.  You are dizzy or lightheaded.  You have a rash.  You have nausea or vomiting.  You were breastfeeding and have not had a  menstrual period within 12 weeks after you stopped breastfeeding.  You are not breastfeeding and have not had a menstrual period by the 12th week after delivery.  You have a fever. SEEK IMMEDIATE MEDICAL CARE IF:   You have persistent pain.  You have chest pain.  You have shortness of breath.  You faint.  You have leg pain.  You have stomach pain.  Your vaginal bleeding saturates two or more sanitary pads in 1 hour. MAKE SURE YOU:   Understand these instructions.  Will watch your condition.  Will get help right away if you are not doing well or get worse. Document Released: 01/13/2000 Document Revised: 10/10/2011 Document Reviewed: 09/12/2011 [facility] Patient Information 2014 [employer] [place].

## 2018-09-12 LAB — TYPE AND SCREEN
ABO/RH(D): O NEG
Antibody Screen: POSITIVE
Unit division: 0
Unit division: 0

## 2018-09-12 LAB — BPAM RBC
Blood Product Expiration Date: 202009122359
Blood Product Expiration Date: 202009122359
Unit Type and Rh: 9500
Unit Type and Rh: 9500

## 2018-09-15 ENCOUNTER — Ambulatory Visit (HOSPITAL_COMMUNITY): Payer: Medicaid Other

## 2018-09-22 DIAGNOSIS — K8020 Calculus of gallbladder without cholecystitis without obstruction: Secondary | ICD-10-CM | POA: Diagnosis not present

## 2018-09-26 ENCOUNTER — Inpatient Hospital Stay (HOSPITAL_COMMUNITY)
Admission: EM | Admit: 2018-09-26 | Discharge: 2018-09-29 | DRG: 769 | Disposition: A | Discharge status: Home / Self Care | Payer: Medicaid Other | Attending: Gastroenterology | Admitting: Gastroenterology

## 2018-09-26 ENCOUNTER — Encounter (HOSPITAL_COMMUNITY): Payer: Self-pay | Admitting: Emergency Medicine

## 2018-09-26 ENCOUNTER — Emergency Department (HOSPITAL_COMMUNITY): Payer: Medicaid Other

## 2018-09-26 ENCOUNTER — Other Ambulatory Visit: Payer: Self-pay

## 2018-09-26 DIAGNOSIS — R52 Pain, unspecified: Secondary | ICD-10-CM

## 2018-09-26 DIAGNOSIS — M5489 Other dorsalgia: Secondary | ICD-10-CM | POA: Diagnosis not present

## 2018-09-26 DIAGNOSIS — K8020 Calculus of gallbladder without cholecystitis without obstruction: Secondary | ICD-10-CM | POA: Diagnosis not present

## 2018-09-26 DIAGNOSIS — Z03818 Encounter for observation for suspected exposure to other biological agents ruled out: Secondary | ICD-10-CM | POA: Diagnosis not present

## 2018-09-26 DIAGNOSIS — K8064 Calculus of gallbladder and bile duct with chronic cholecystitis without obstruction: Secondary | ICD-10-CM | POA: Diagnosis present

## 2018-09-26 DIAGNOSIS — Z9049 Acquired absence of other specified parts of digestive tract: Secondary | ICD-10-CM

## 2018-09-26 DIAGNOSIS — R0902 Hypoxemia: Secondary | ICD-10-CM | POA: Diagnosis not present

## 2018-09-26 DIAGNOSIS — R740 Nonspecific elevation of levels of transaminase and lactic acid dehydrogenase [LDH]: Secondary | ICD-10-CM | POA: Diagnosis not present

## 2018-09-26 DIAGNOSIS — R7401 Elevation of levels of liver transaminase levels: Secondary | ICD-10-CM

## 2018-09-26 DIAGNOSIS — K8050 Calculus of bile duct without cholangitis or cholecystitis without obstruction: Secondary | ICD-10-CM

## 2018-09-26 DIAGNOSIS — Z20828 Contact with and (suspected) exposure to other viral communicable diseases: Secondary | ICD-10-CM | POA: Diagnosis present

## 2018-09-26 DIAGNOSIS — R1011 Right upper quadrant pain: Secondary | ICD-10-CM | POA: Diagnosis not present

## 2018-09-26 DIAGNOSIS — K819 Cholecystitis, unspecified: Secondary | ICD-10-CM | POA: Diagnosis present

## 2018-09-26 DIAGNOSIS — R932 Abnormal findings on diagnostic imaging of liver and biliary tract: Secondary | ICD-10-CM

## 2018-09-26 DIAGNOSIS — O9963 Diseases of the digestive system complicating the puerperium: Principal | ICD-10-CM | POA: Diagnosis present

## 2018-09-26 DIAGNOSIS — R1084 Generalized abdominal pain: Secondary | ICD-10-CM | POA: Diagnosis not present

## 2018-09-26 DIAGNOSIS — Z8632 Personal history of gestational diabetes: Secondary | ICD-10-CM

## 2018-09-26 LAB — CBC WITH DIFFERENTIAL/PLATELET
Abs Immature Granulocytes: 0.01 10*3/uL (ref 0.00–0.07)
Basophils Absolute: 0 10*3/uL (ref 0.0–0.1)
Basophils Relative: 1 %
Eosinophils Absolute: 0 10*3/uL (ref 0.0–0.5)
Eosinophils Relative: 1 %
HCT: 35.1 % — ABNORMAL LOW (ref 36.0–46.0)
Hemoglobin: 11.3 g/dL — ABNORMAL LOW (ref 12.0–15.0)
Immature Granulocytes: 0 %
Lymphocytes Relative: 27 %
Lymphs Abs: 1.6 10*3/uL (ref 0.7–4.0)
MCH: 29.7 pg (ref 26.0–34.0)
MCHC: 32.2 g/dL (ref 30.0–36.0)
MCV: 92.4 fL (ref 80.0–100.0)
Monocytes Absolute: 0.5 10*3/uL (ref 0.1–1.0)
Monocytes Relative: 8 %
Neutro Abs: 3.8 10*3/uL (ref 1.7–7.7)
Neutrophils Relative %: 63 %
Platelets: 277 10*3/uL (ref 150–400)
RBC: 3.8 MIL/uL — ABNORMAL LOW (ref 3.87–5.11)
RDW: 13.1 % (ref 11.5–15.5)
WBC: 5.9 10*3/uL (ref 4.0–10.5)
nRBC: 0 % (ref 0.0–0.2)

## 2018-09-26 LAB — COMPREHENSIVE METABOLIC PANEL
ALT: 160 U/L — ABNORMAL HIGH (ref 0–44)
AST: 243 U/L — ABNORMAL HIGH (ref 15–41)
Albumin: 3.6 g/dL (ref 3.5–5.0)
Alkaline Phosphatase: 136 U/L — ABNORMAL HIGH (ref 38–126)
Anion gap: 9 (ref 5–15)
BUN: 20 mg/dL (ref 6–20)
CO2: 22 mmol/L (ref 22–32)
Calcium: 8.7 mg/dL — ABNORMAL LOW (ref 8.9–10.3)
Chloride: 106 mmol/L (ref 98–111)
Creatinine, Ser: 0.6 mg/dL (ref 0.44–1.00)
GFR calc Af Amer: >60 mL/min (ref >60)
GFR calc non Af Amer: >60 mL/min (ref >60)
Glucose, Bld: 109 mg/dL — ABNORMAL HIGH (ref 70–99)
Potassium: 4 mmol/L (ref 3.5–5.1)
Sodium: 137 mmol/L (ref 135–145)
Total Bilirubin: 0.6 mg/dL (ref 0.3–1.2)
Total Protein: 7.1 g/dL (ref 6.5–8.1)

## 2018-09-26 LAB — LIPASE, BLOOD: Lipase: 35 U/L (ref 11–51)

## 2018-09-26 MED ORDER — MORPHINE SULFATE (PF) 4 MG/ML IV SOLN
4.0000 mg | Freq: Once | INTRAVENOUS | Status: AC
  Administered 2018-09-26: 4 mg via INTRAVENOUS
  Filled 2018-09-26: qty 1

## 2018-09-26 NOTE — ED Notes (Signed)
Patient requested pain medication, when pain medication was brought to room, she said she is not currently in pain but will call out if the pain returns.

## 2018-09-26 NOTE — ED Provider Notes (Signed)
[facility] DEPT Provider Note   CSN: [id number] Arrival date & time: 09/26/18  2150     History   Chief Complaint Chief Complaint  Patient presents with  . Abdominal Pain    HPI Heather Savage is a 28 y.o. female with a hx of anemia, hearing loss, gastritis, preterm labor, cholelithiasis presents to the Emergency Department complaining of intermittent, persistent, progressively worsening right upper quadrant pain onset several weeks ago but becoming much more frequent in the last few days.  Patient began having right upper quadrant abdominal pain during her third trimester pregnancy and was diagnosed with cholelithiasis.  Patient with SVD on 6/56/8127 without complications.  After birth she continued to have worsening pain in the right upper quadrant with some decreased p.o. intake.  Patient was seen by [facility] surgery on 09/23/2018 and they scheduled surgery for 1 month from now.  She was given oxycodone for pain control at home.  Patient has been taking this without relief.  Patient has been report episodes of pain daily since the birth of her baby.  Tonight pain started around 6 PM and continued to worsen despite oxycodone.  Patient and husband deny nausea or vomiting.  Patient has been afebrile.  Due to uncontrolled pain, they presented here to the emergency department.  Nothing specifically makes her symptoms worse.     The history is provided by the patient and medical records. The history is limited by a language barrier. A language interpreter was used (Patient unable to hear translator on the iPad given hearing loss, patient's husband translates).    Past Medical History:  Diagnosis Date  . Anemia    states has low blood count, no current meds.  Marland Kitchen Hearing loss    right  . History of gastritis    no current med.  Marland Kitchen History of malaria 2012  . Preterm labor   . Rh negative, maternal   . Tympanic membrane perforation 05/2012   right     Patient Active Problem List   Diagnosis Date Noted  . Indication for care in labor or delivery 09/08/2018  . Cholelithiasis affecting pregnancy in third trimester, antepartum 08/29/2018  . Threatened premature labor in third trimester 08/29/2018  . Preterm labor 08/26/2018  . Gestational diabetes 07/16/2018  . E. coli UTI (urinary tract infection) 03/26/2018  . Supervision of other normal pregnancy, antepartum 03/19/2018  . Rh negative status during pregnancy 02/08/2016  . Language barrier 01/02/2016  . Hearing loss 05/01/2011    Past Surgical History:  Procedure Laterality Date  . EXTERNAL EAR SURGERY    . SCAR REVISION  08/27/2011   Procedure: SCAR REVISION;  Surgeon: Cristine Polio, MD;  Location: [facility];  Service: Plastics;  Laterality: N/A;  scar revision of forehead  . TYMPANOPLASTY  05/14/2011   Procedure: TYMPANOPLASTY;  Surgeon: Izora Gala, MD;  Location: [facility];  Service: ENT;  Laterality: Left;  TYMPANOPLASYT AND  POSSIBLE OSSICULARPLASTY   . TYMPANOPLASTY  10/10/2011   Procedure: TYMPANOPLASTY;  Surgeon: Izora Gala, MD;  Location: [facility];  Service: ENT;  Laterality: Right;  . TYMPANOPLASTY Right 06/30/2012   Procedure: REVISION RIGHT TYMPANOPLASTY;  Surgeon: Izora Gala, MD;  Location: [facility];  Service: ENT;  Laterality: Right;     OB History    Gravida  3   Para  2   Term  2   Preterm  0   AB  1   Living  2     SAB  1   TAB  0   Ectopic  0   Multiple  0   Live Births  2            Home Medications    Prior to Admission medications   Medication Sig Start Date End Date Taking? Authorizing Provider  omeprazole (PRILOSEC) 40 MG capsule Take 1 capsule (40 mg total) by mouth daily. Patient taking differently: Take 40 mg by mouth daily as needed (Heart Burn).  06/11/18  Yes Shelly Bombard, MD  oxyCODONE-acetaminophen (PERCOCET/ROXICET) 5-325 MG tablet Take 1 tablet by mouth every 4  (four) hours as needed for severe pain. 09/11/18  Yes Nicolette Bang, DO  Prenat-FeAsp-Meth-FA-DHA w/o A (PRENATE PIXIE) 10-0.6-0.4-200 MG CAPS Take 1 tablet by mouth daily. 06/11/18  Yes Shelly Bombard, MD  Accu-Chek FastClix Lancets MISC 1 Stick by Percutaneous route 4 (four) times daily. 07/17/18   Sloan Leiter, MD  Blood Glucose Monitoring Suppl (ACCU-CHEK GUIDE) w/Device KIT 1 Device by Percutaneous route 4 (four) times daily. 07/17/18   Sloan Leiter, MD  cyclobenzaprine (FLEXERIL) 10 MG tablet Take 1 tablet (10 mg total) by mouth 2 (two) times daily as needed for muscle spasms. Patient not taking: Reported on 09/26/2018 08/24/18   Wende Mott, CNM  famotidine (PEPCID) 20 MG tablet Take 1 tablet (20 mg total) by mouth 2 (two) times daily. Patient not taking: Reported on 09/26/2018 09/03/18   Woodroe Mode, MD  glucose blood (ACCU-CHEK GUIDE) test strip Use to check blood sugars four times a day as instructed 07/17/18   Sloan Leiter, MD  ibuprofen (ADVIL) 600 MG tablet Take 1 tablet (600 mg total) by mouth every 6 (six) hours. Patient not taking: Reported on 09/26/2018 09/11/18   Nicolette Bang, DO  senna-docusate (SENOKOT-S) 8.6-50 MG tablet Take 2 tablets by mouth daily. Patient not taking: Reported on 09/26/2018 09/12/18   Nicolette Bang, DO    Family History No family history on file.  Social History Social History   Tobacco Use  . Smoking status: Never Smoker  . Smokeless tobacco: Never Used  Substance Use Topics  . Alcohol use: Never    Frequency: Never  . Drug use: Never     Allergies   Patient has no known allergies.   Review of Systems Review of Systems  Constitutional: Negative for appetite change, diaphoresis, fatigue, fever and unexpected weight change.  HENT: Negative for mouth sores and trouble swallowing.   Respiratory: Negative for cough, chest tightness, shortness of breath, wheezing and stridor.   Cardiovascular:  Negative for chest pain and palpitations.  Gastrointestinal: Positive for abdominal pain, nausea and vomiting. Negative for abdominal distention, blood in stool, constipation, diarrhea and rectal pain.  Genitourinary: Negative for difficulty urinating, dysuria, flank pain, frequency, hematuria and urgency.  Musculoskeletal: Negative for back pain, neck pain and neck stiffness.  Skin: Negative for rash.  Neurological: Negative for weakness.  Hematological: Negative for adenopathy.  Psychiatric/Behavioral: Negative for confusion.  All other systems reviewed and are negative.    Physical Exam Updated Vital Signs BP 131/60 (BP Location: Right Arm)   Pulse 61   Temp 97.6 F (36.4 C) (Oral)   Resp 15   Ht 5' 3"  (1.6 m)   Wt 65.8 kg   LMP 12/22/2017 (Exact Date)   SpO2 100%   BMI 25.69 kg/m   Physical Exam Vitals signs and nursing note reviewed.  Constitutional:  General: She is not in acute distress.    Appearance: She is not diaphoretic.  HENT:     Head: Normocephalic.  Eyes:     General: No scleral icterus.    Conjunctiva/sclera: Conjunctivae normal.  Neck:     Musculoskeletal: Normal range of motion.  Cardiovascular:     Rate and Rhythm: Normal rate and regular rhythm.     Pulses: Normal pulses.          Radial pulses are 2+ on the right side and 2+ on the left side.  Pulmonary:     Effort: No tachypnea, accessory muscle usage, prolonged expiration, respiratory distress or retractions.     Breath sounds: No stridor.     Comments: Equal chest rise. No increased work of breathing. Abdominal:     General: There is no distension.     Palpations: Abdomen is soft.     Tenderness: There is abdominal tenderness in the right upper quadrant and epigastric area. There is guarding. There is no rebound. Positive signs include Murphy's sign.  Musculoskeletal:     Comments: Moves all extremities equally and without difficulty.  Skin:    General: Skin is warm and dry.      Capillary Refill: Capillary refill takes less than 2 seconds.  Neurological:     Mental Status: She is alert.     GCS: GCS eye subscore is 4. GCS verbal subscore is 5. GCS motor subscore is 6.     Comments: Speech is clear and goal oriented.  Psychiatric:        Mood and Affect: Mood normal.      ED Treatments / Results  Labs (all labs ordered are listed, but only abnormal results are displayed) Labs Reviewed  CBC WITH DIFFERENTIAL/PLATELET - Abnormal; Notable for the following components:      Result Value   RBC 3.80 (*)    Hemoglobin 11.3 (*)    HCT 35.1 (*)    All other components within normal limits  COMPREHENSIVE METABOLIC PANEL - Abnormal; Notable for the following components:   Glucose, Bld 109 (*)    Calcium 8.7 (*)    AST 243 (*)    ALT 160 (*)    Alkaline Phosphatase 136 (*)    All other components within normal limits  SARS CORONAVIRUS 2 (TAT 6-12 HRS)  LIPASE, BLOOD    EKG None  Radiology US Abdomen Limited  Result Date: 09/27/2018 CLINICAL DATA:  Right upper quadrant pain EXAM: ULTRASOUND ABDOMEN LIMITED RIGHT UPPER QUADRANT COMPARISON:  Ultrasound 08/26/2018 FINDINGS: Gallbladder: Multiple shadowing stones measuring up to 9 mm. Normal wall thickness. No sonographic Percell Miller indicated. Common bile duct: Diameter: 4.7 mm Liver: No focal lesion identified. Within normal limits in parenchymal echogenicity. Portal vein is patent on color Doppler imaging with normal direction of blood flow towards the liver. Other: None. IMPRESSION: Cholelithiasis without sonographic evidence for acute cholecystitis or biliary dilatation. Electronically Signed   By: Donavan Foil M.D.   On: 09/27/2018 00:43    Procedures Procedures (including critical care time)  Medications Ordered in ED Medications  morphine 4 MG/ML injection 4 mg (4 mg Intravenous Given 09/26/18 2344)     Initial Impression / Assessment and Plan / ED Course  I have reviewed the triage vital signs and the  nursing notes.  Pertinent labs & imaging results that were available during my care of the patient were reviewed by me and considered in my medical decision making (see chart for details).  Clinical  Course as of Sep 27 150  Fri Sep 26, 2018  2355 Improving  Hemoglobin(!): 11.3 [HM]  2355 No leukocytosis  WBC: 5.9 [HM]  2355 Elevation in AST/ALT and alk phos.  Total bili is within normal limits.  These changes are new from previous labs approximately 2 weeks ago.  AST(!): 243 [HM]  Sat Sep 27, 2018  0136 Patient remains exquisitely tender in the right upper quadrant with guarding on exam.   [HM]  0137 No cholecystitis or choledocholithiasis however patient continues to have symptoms and elevated LFTs.  US Abdomen Limited [HM]  0151 Discussed with Dr. Lucia Gaskins who will admit.     [HM]    Clinical Course User Index [HM] Doral Digangi, Gwenlyn Perking        Patient presents with worsening right upper quadrant abdominal pain and known gallstones.  Patient is afebrile today but has severe pain despite pain control.  Elevated AST/ALT and alk phos gives rise to concern for cholecystitis versus choledocholithiasis.  Ultrasound pending.  Additional pain control given.  Patient without tachycardia or hypotension here.  No evidence of sepsis.  Ultrasound without evidence of cholecystitis however patient continues to have pain.  Given abnormal transaminase and persistent pain along with recurrent symptoms will have patient admitted for trending of labs and pain control.  Discussed with Dr. Lucia Gaskins of surgery who will admit.  The patient was discussed with and seen by Dr. Leonette Monarch who agrees with the treatment plan.   Final Clinical Impressions(s) / ED Diagnoses   Final diagnoses:  Right upper quadrant abdominal pain  Intractable pain  Elevated transaminase level  Calculus of gallbladder without cholecystitis without obstruction    ED Discharge Orders    None       Loni Muse Gwenlyn Perking 09/27/18 0152    Fatima Blank, MD 09/27/18 [phone]

## 2018-09-26 NOTE — ED Triage Notes (Addendum)
Arrives via EMS from home. C/C upper abd pain x1 hour, dx with gallstones x2 weeks, oxycodone prescription did not touch her pain, denies N/V. Was told she needed her gallbladder removed, just had a baby augsut 11th. EMS gave 100 mcg Fentanyl, 0/10 pain currently. 250 mL NS.

## 2018-09-27 ENCOUNTER — Encounter (HOSPITAL_COMMUNITY): Payer: Self-pay | Admitting: *Deleted

## 2018-09-27 ENCOUNTER — Encounter (HOSPITAL_COMMUNITY): Admission: EM | Disposition: A | Discharge status: Home / Self Care | Payer: Self-pay | Source: Home / Self Care

## 2018-09-27 ENCOUNTER — Inpatient Hospital Stay (HOSPITAL_COMMUNITY): Payer: Medicaid Other | Admitting: Anesthesiology

## 2018-09-27 ENCOUNTER — Inpatient Hospital Stay (HOSPITAL_COMMUNITY): Payer: Medicaid Other

## 2018-09-27 ENCOUNTER — Other Ambulatory Visit: Payer: Self-pay

## 2018-09-27 DIAGNOSIS — Z8632 Personal history of gestational diabetes: Secondary | ICD-10-CM | POA: Diagnosis not present

## 2018-09-27 DIAGNOSIS — O9963 Diseases of the digestive system complicating the puerperium: Secondary | ICD-10-CM | POA: Diagnosis not present

## 2018-09-27 DIAGNOSIS — Z9049 Acquired absence of other specified parts of digestive tract: Secondary | ICD-10-CM

## 2018-09-27 DIAGNOSIS — K8062 Calculus of gallbladder and bile duct with acute cholecystitis without obstruction: Secondary | ICD-10-CM | POA: Diagnosis not present

## 2018-09-27 DIAGNOSIS — K819 Cholecystitis, unspecified: Secondary | ICD-10-CM | POA: Diagnosis present

## 2018-09-27 DIAGNOSIS — K838 Other specified diseases of biliary tract: Secondary | ICD-10-CM | POA: Diagnosis not present

## 2018-09-27 DIAGNOSIS — Z03818 Encounter for observation for suspected exposure to other biological agents ruled out: Secondary | ICD-10-CM | POA: Diagnosis not present

## 2018-09-27 DIAGNOSIS — K8020 Calculus of gallbladder without cholecystitis without obstruction: Secondary | ICD-10-CM | POA: Diagnosis not present

## 2018-09-27 DIAGNOSIS — D649 Anemia, unspecified: Secondary | ICD-10-CM | POA: Diagnosis not present

## 2018-09-27 DIAGNOSIS — R748 Abnormal levels of other serum enzymes: Secondary | ICD-10-CM | POA: Diagnosis not present

## 2018-09-27 DIAGNOSIS — K8050 Calculus of bile duct without cholangitis or cholecystitis without obstruction: Secondary | ICD-10-CM | POA: Diagnosis not present

## 2018-09-27 DIAGNOSIS — K8064 Calculus of gallbladder and bile duct with chronic cholecystitis without obstruction: Secondary | ICD-10-CM | POA: Diagnosis not present

## 2018-09-27 DIAGNOSIS — E119 Type 2 diabetes mellitus without complications: Secondary | ICD-10-CM | POA: Diagnosis not present

## 2018-09-27 DIAGNOSIS — R932 Abnormal findings on diagnostic imaging of liver and biliary tract: Secondary | ICD-10-CM | POA: Diagnosis not present

## 2018-09-27 DIAGNOSIS — R109 Unspecified abdominal pain: Secondary | ICD-10-CM | POA: Diagnosis not present

## 2018-09-27 DIAGNOSIS — K8010 Calculus of gallbladder with chronic cholecystitis without obstruction: Secondary | ICD-10-CM | POA: Diagnosis not present

## 2018-09-27 DIAGNOSIS — K8012 Calculus of gallbladder with acute and chronic cholecystitis without obstruction: Secondary | ICD-10-CM | POA: Diagnosis not present

## 2018-09-27 DIAGNOSIS — R740 Nonspecific elevation of levels of transaminase and lactic acid dehydrogenase [LDH]: Secondary | ICD-10-CM | POA: Diagnosis not present

## 2018-09-27 DIAGNOSIS — K810 Acute cholecystitis: Secondary | ICD-10-CM | POA: Diagnosis not present

## 2018-09-27 DIAGNOSIS — R1011 Right upper quadrant pain: Secondary | ICD-10-CM | POA: Diagnosis not present

## 2018-09-27 DIAGNOSIS — Z20828 Contact with and (suspected) exposure to other viral communicable diseases: Secondary | ICD-10-CM | POA: Diagnosis not present

## 2018-09-27 HISTORY — PX: CHOLECYSTECTOMY: SHX55

## 2018-09-27 LAB — SURGICAL PCR SCREEN
MRSA, PCR: NEGATIVE
Staphylococcus aureus: NEGATIVE

## 2018-09-27 LAB — SARS CORONAVIRUS 2 BY RT PCR (HOSPITAL ORDER, PERFORMED IN ~~LOC~~ HOSPITAL LAB): SARS Coronavirus 2: NEGATIVE

## 2018-09-27 SURGERY — LAPAROSCOPIC CHOLECYSTECTOMY WITH INTRAOPERATIVE CHOLANGIOGRAM
Anesthesia: General | Site: Abdomen

## 2018-09-27 MED ORDER — FENTANYL CITRATE (PF) 250 MCG/5ML IJ SOLN
INTRAMUSCULAR | Status: AC
  Filled 2018-09-27: qty 5

## 2018-09-27 MED ORDER — ONDANSETRON HCL 4 MG/2ML IJ SOLN: 4.0000 mg | Freq: Four times a day (QID) | INTRAMUSCULAR | Status: DC | PRN

## 2018-09-27 MED ORDER — LIDOCAINE 2% (20 MG/ML) 5 ML SYRINGE
INTRAMUSCULAR | Status: DC | PRN
  Administered 2018-09-27: 60 mg via INTRAVENOUS

## 2018-09-27 MED ORDER — KCL IN DEXTROSE-NACL 20-5-0.45 MEQ/L-%-% IV SOLN
INTRAVENOUS | Status: DC
  Administered 2018-09-27: 06:00:00 via INTRAVENOUS
  Filled 2018-09-27: qty 1000

## 2018-09-27 MED ORDER — LACTATED RINGERS IV SOLN
INTRAVENOUS | Status: DC | PRN
  Administered 2018-09-27: 11:00:00 via INTRAVENOUS

## 2018-09-27 MED ORDER — FENTANYL CITRATE (PF) 100 MCG/2ML IJ SOLN
INTRAMUSCULAR | Status: DC | PRN
  Administered 2018-09-27 (×4): 50 ug via INTRAVENOUS

## 2018-09-27 MED ORDER — BUPIVACAINE-EPINEPHRINE 0.5% -1:200000 IJ SOLN
INTRAMUSCULAR | Status: DC | PRN
  Administered 2018-09-27: 20 mL

## 2018-09-27 MED ORDER — MIDAZOLAM HCL 5 MG/5ML IJ SOLN
INTRAMUSCULAR | Status: DC | PRN
  Administered 2018-09-27: 2 mg via INTRAVENOUS

## 2018-09-27 MED ORDER — ONDANSETRON HCL 4 MG/2ML IJ SOLN
INTRAMUSCULAR | Status: DC | PRN
  Administered 2018-09-27: 4 mg via INTRAVENOUS

## 2018-09-27 MED ORDER — HYDRALAZINE HCL 20 MG/ML IJ SOLN: 10.0000 mg | INTRAMUSCULAR | Status: DC | PRN

## 2018-09-27 MED ORDER — ROCURONIUM BROMIDE 10 MG/ML (PF) SYRINGE
PREFILLED_SYRINGE | INTRAVENOUS | Status: DC | PRN
  Administered 2018-09-27: 40 mg via INTRAVENOUS
  Administered 2018-09-27: 10 mg via INTRAVENOUS

## 2018-09-27 MED ORDER — HYDROMORPHONE HCL 1 MG/ML IJ SOLN
0.5000 mg | INTRAMUSCULAR | Status: DC | PRN
  Administered 2018-09-27 – 2018-09-28 (×4): 0.5 mg via INTRAVENOUS
  Filled 2018-09-27 (×4): qty 0.5

## 2018-09-27 MED ORDER — HEPARIN SODIUM (PORCINE) 5000 UNIT/ML IJ SOLN
5000.0000 [IU] | Freq: Three times a day (TID) | INTRAMUSCULAR | Status: DC
  Administered 2018-09-27 – 2018-09-29 (×4): 5000 [IU] via SUBCUTANEOUS
  Filled 2018-09-27 (×5): qty 1

## 2018-09-27 MED ORDER — SIMETHICONE 80 MG PO CHEW
40.0000 mg | CHEWABLE_TABLET | Freq: Four times a day (QID) | ORAL | Status: DC | PRN
  Administered 2018-09-27 – 2018-09-29 (×2): 40 mg via ORAL
  Filled 2018-09-27 (×2): qty 1

## 2018-09-27 MED ORDER — 0.9 % SODIUM CHLORIDE (POUR BTL) OPTIME
TOPICAL | Status: DC | PRN
  Administered 2018-09-27: 12:00:00 1000 mL

## 2018-09-27 MED ORDER — ONDANSETRON 4 MG PO TBDP: 4.0000 mg | ORAL_TABLET | Freq: Four times a day (QID) | ORAL | Status: DC | PRN

## 2018-09-27 MED ORDER — HYDROCODONE-ACETAMINOPHEN 5-325 MG PO TABS: 1.0000 | ORAL_TABLET | ORAL | Status: DC | PRN

## 2018-09-27 MED ORDER — IBUPROFEN 400 MG PO TABS
600.0000 mg | ORAL_TABLET | Freq: Four times a day (QID) | ORAL | Status: DC | PRN
  Administered 2018-09-29: 600 mg via ORAL
  Filled 2018-09-27: qty 1

## 2018-09-27 MED ORDER — SUGAMMADEX SODIUM 200 MG/2ML IV SOLN
INTRAVENOUS | Status: DC | PRN
  Administered 2018-09-27: 200 mg via INTRAVENOUS

## 2018-09-27 MED ORDER — SODIUM CHLORIDE 0.9 % IV SOLN
2.0000 g | INTRAVENOUS | Status: DC
  Administered 2018-09-27: 2 g via INTRAVENOUS
  Filled 2018-09-27: qty 2

## 2018-09-27 MED ORDER — ONDANSETRON HCL 4 MG/2ML IJ SOLN
4.0000 mg | Freq: Four times a day (QID) | INTRAMUSCULAR | Status: DC | PRN
  Administered 2018-09-27 – 2018-09-28 (×2): 4 mg via INTRAVENOUS
  Filled 2018-09-27: qty 2

## 2018-09-27 MED ORDER — DIPHENHYDRAMINE HCL 12.5 MG/5ML PO ELIX: 12.5000 mg | ORAL_SOLUTION | Freq: Four times a day (QID) | ORAL | Status: DC | PRN

## 2018-09-27 MED ORDER — IOHEXOL 300 MG/ML  SOLN
INTRAMUSCULAR | Status: DC | PRN
  Administered 2018-09-27: 50 mL

## 2018-09-27 MED ORDER — LACTATED RINGERS IV SOLN
INTRAVENOUS | Status: DC
  Administered 2018-09-27 – 2018-09-28 (×3): via INTRAVENOUS

## 2018-09-27 MED ORDER — PIPERACILLIN-TAZOBACTAM 3.375 G IVPB
3.3750 g | Freq: Three times a day (TID) | INTRAVENOUS | Status: DC
  Administered 2018-09-27 – 2018-09-29 (×6): 3.375 g via INTRAVENOUS
  Filled 2018-09-27 (×7): qty 50

## 2018-09-27 MED ORDER — DIPHENHYDRAMINE HCL 50 MG/ML IJ SOLN: 12.5000 mg | Freq: Four times a day (QID) | INTRAMUSCULAR | Status: DC | PRN

## 2018-09-27 MED ORDER — SUCCINYLCHOLINE CHLORIDE 20 MG/ML IJ SOLN
INTRAMUSCULAR | Status: DC | PRN
  Administered 2018-09-27: 80 mg via INTRAVENOUS

## 2018-09-27 MED ORDER — SODIUM CHLORIDE (PF) 0.9 % IJ SOLN
INTRAMUSCULAR | Status: AC
  Filled 2018-09-27: qty 20

## 2018-09-27 MED ORDER — MUPIROCIN 2 % EX OINT
1.0000 "application " | TOPICAL_OINTMENT | Freq: Two times a day (BID) | CUTANEOUS | Status: DC
  Administered 2018-09-27 – 2018-09-29 (×4): 1 via NASAL
  Filled 2018-09-27: qty 22

## 2018-09-27 MED ORDER — PROPOFOL 10 MG/ML IV BOLUS
INTRAVENOUS | Status: DC | PRN
  Administered 2018-09-27: 140 mg via INTRAVENOUS

## 2018-09-27 MED ORDER — LACTATED RINGERS IV SOLN
INTRAVENOUS | Status: AC | PRN
  Administered 2018-09-27: 1000 mL

## 2018-09-27 MED ORDER — FENTANYL CITRATE (PF) 100 MCG/2ML IJ SOLN
INTRAMUSCULAR | Status: AC
  Administered 2018-09-27: 15:00:00
  Filled 2018-09-27: qty 2

## 2018-09-27 MED ORDER — TRAMADOL HCL 50 MG PO TABS: 50.0000 mg | ORAL_TABLET | Freq: Four times a day (QID) | ORAL | Status: DC | PRN

## 2018-09-27 MED ORDER — PROMETHAZINE HCL 25 MG/ML IJ SOLN
6.2500 mg | INTRAMUSCULAR | Status: DC | PRN
  Administered 2018-09-27: 6.25 mg via INTRAVENOUS

## 2018-09-27 MED ORDER — MORPHINE SULFATE (PF) 2 MG/ML IV SOLN
1.0000 mg | INTRAVENOUS | Status: DC | PRN
  Administered 2018-09-27: 2 mg via INTRAVENOUS
  Filled 2018-09-27: qty 1

## 2018-09-27 MED ORDER — ACETAMINOPHEN 500 MG PO TABS
1000.0000 mg | ORAL_TABLET | Freq: Four times a day (QID) | ORAL | Status: DC
  Administered 2018-09-28 – 2018-09-29 (×5): 1000 mg via ORAL
  Filled 2018-09-27 (×7): qty 2

## 2018-09-27 MED ORDER — FENTANYL CITRATE (PF) 100 MCG/2ML IJ SOLN
25.0000 ug | INTRAMUSCULAR | Status: DC | PRN
  Administered 2018-09-27 (×2): 25 ug via INTRAVENOUS

## 2018-09-27 MED ORDER — BUPIVACAINE-EPINEPHRINE (PF) 0.5% -1:200000 IJ SOLN
INTRAMUSCULAR | Status: AC
  Filled 2018-09-27: qty 30

## 2018-09-27 MED ORDER — DEXAMETHASONE SODIUM PHOSPHATE 10 MG/ML IJ SOLN
INTRAMUSCULAR | Status: DC | PRN
  Administered 2018-09-27: 5 mg via INTRAVENOUS

## 2018-09-27 MED ORDER — ROCURONIUM BROMIDE 10 MG/ML (PF) SYRINGE
PREFILLED_SYRINGE | INTRAVENOUS | Status: AC
  Filled 2018-09-27: qty 30

## 2018-09-27 MED ORDER — PROMETHAZINE HCL 25 MG/ML IJ SOLN
INTRAMUSCULAR | Status: AC
  Administered 2018-09-27: 15:00:00
  Filled 2018-09-27: qty 1

## 2018-09-27 MED ORDER — MIDAZOLAM HCL 2 MG/2ML IJ SOLN
INTRAMUSCULAR | Status: AC
  Filled 2018-09-27: qty 2

## 2018-09-27 MED ORDER — PHENYLEPHRINE 40 MCG/ML (10ML) SYRINGE FOR IV PUSH (FOR BLOOD PRESSURE SUPPORT)
PREFILLED_SYRINGE | INTRAVENOUS | Status: DC | PRN
  Administered 2018-09-27: 120 ug via INTRAVENOUS
  Administered 2018-09-27: 80 ug via INTRAVENOUS
  Administered 2018-09-27: 120 ug via INTRAVENOUS
  Administered 2018-09-27: 80 ug via INTRAVENOUS

## 2018-09-27 MED ORDER — SUCCINYLCHOLINE CHLORIDE 200 MG/10ML IV SOSY
PREFILLED_SYRINGE | INTRAVENOUS | Status: AC
  Filled 2018-09-27: qty 20

## 2018-09-27 MED ORDER — DOCUSATE SODIUM 100 MG PO CAPS
100.0000 mg | ORAL_CAPSULE | Freq: Two times a day (BID) | ORAL | Status: DC
  Administered 2018-09-28 – 2018-09-29 (×2): 100 mg via ORAL
  Filled 2018-09-27 (×2): qty 1

## 2018-09-27 SURGICAL SUPPLY — 46 items
APPLICATOR ARISTA FLEXITIP XL (MISCELLANEOUS) IMPLANT
APPLIER CLIP 5 13 M/L LIGAMAX5 (MISCELLANEOUS) ×2
APPLIER CLIP ROT 10 11.4 M/L (STAPLE) ×2
CABLE HIGH FREQUENCY MONO STRZ (ELECTRODE) ×2 IMPLANT
CHLORAPREP W/TINT 26 (MISCELLANEOUS) ×2 IMPLANT
CHOLANGIOGRAM CATH TAUT (CATHETERS) ×2 IMPLANT
CLIP APPLIE 5 13 M/L LIGAMAX5 (MISCELLANEOUS) ×1 IMPLANT
CLIP APPLIE ROT 10 11.4 M/L (STAPLE) ×1 IMPLANT
COVER MAYO STAND STRL (DRAPES) ×2 IMPLANT
COVER SURGICAL LIGHT HANDLE (MISCELLANEOUS) ×2 IMPLANT
COVER WAND RF STERILE (DRAPES) IMPLANT
DECANTER SPIKE VIAL GLASS SM (MISCELLANEOUS) ×2 IMPLANT
DERMABOND ADVANCED (GAUZE/BANDAGES/DRESSINGS) ×1
DERMABOND ADVANCED .7 DNX12 (GAUZE/BANDAGES/DRESSINGS) ×1 IMPLANT
DISSECTOR BLUNT TIP ENDO 5MM (MISCELLANEOUS) IMPLANT
DRAPE C-ARM 42X120 X-RAY (DRAPES) IMPLANT
ELECT PENCIL ROCKER SW 15FT (MISCELLANEOUS) ×2 IMPLANT
ELECT REM PT RETURN 15FT ADLT (MISCELLANEOUS) ×2 IMPLANT
ENDOLOOP SUT PDS II  0 18 (SUTURE) ×2
ENDOLOOP SUT PDS II 0 18 (SUTURE) ×2 IMPLANT
GLOVE BIO SURGEON STRL SZ7.5 (GLOVE) ×2 IMPLANT
GLOVE INDICATOR 8.0 STRL GRN (GLOVE) ×2 IMPLANT
GOWN STRL REUS W/TWL XL LVL3 (GOWN DISPOSABLE) ×4 IMPLANT
GRASPER SUT TROCAR 14GX15 (MISCELLANEOUS) IMPLANT
HEMOSTAT ARISTA ABSORB 3G PWDR (HEMOSTASIS) IMPLANT
HEMOSTAT SNOW SURGICEL 2X4 (HEMOSTASIS) IMPLANT
IV SET EXTENSION CATH 6 NF (IV SETS) ×2 IMPLANT
KIT BASIN OR (CUSTOM PROCEDURE TRAY) ×2 IMPLANT
KIT TURNOVER KIT A (KITS) IMPLANT
NEEDLE INSUFFLATION 14GA 120MM (NEEDLE) IMPLANT
POUCH SPECIMEN RETRIEVAL 10MM (ENDOMECHANICALS) ×2 IMPLANT
SCISSORS LAP 5X35 DISP (ENDOMECHANICALS) ×2 IMPLANT
SET CHOLANGIOGRAPH MIX (MISCELLANEOUS) IMPLANT
SET IRRIG TUBING LAPAROSCOPIC (IRRIGATION / IRRIGATOR) ×2 IMPLANT
SET TUBE SMOKE EVAC HIGH FLOW (TUBING) ×2 IMPLANT
SLEEVE ADV FIXATION 5X100MM (TROCAR) ×4 IMPLANT
STOPCOCK 4 WAY LG BORE MALE ST (IV SETS) ×2 IMPLANT
SUT MNCRL AB 4-0 PS2 18 (SUTURE) ×2 IMPLANT
SYR 10ML ECCENTRIC (SYRINGE) ×2 IMPLANT
TOWEL OR 17X26 10 PK STRL BLUE (TOWEL DISPOSABLE) ×2 IMPLANT
TOWEL OR NON WOVEN STRL DISP B (DISPOSABLE) IMPLANT
TRAY LAPAROSCOPIC (CUSTOM PROCEDURE TRAY) ×2 IMPLANT
TROCAR ADV FIXATION 12X100MM (TROCAR) IMPLANT
TROCAR ADV FIXATION 5X100MM (TROCAR) ×2 IMPLANT
TROCAR XCEL BLUNT TIP 100MML (ENDOMECHANICALS) ×2 IMPLANT
TROCAR XCEL NON-BLD 11X100MML (ENDOMECHANICALS) IMPLANT

## 2018-09-27 NOTE — Progress Notes (Signed)
Pt stable at time of transport to pacu. No needs at time of transport to pacu.

## 2018-09-27 NOTE — Progress Notes (Signed)
Pharmacy Antibiotic Note  Heather Savage is a 28 y.o. female admitted on 09/26/2018 with Cholecystitis, Cholethiasis.  Pharmacy has been consulted for Zosyn dosing.  Plans for ERCP on 09/28/18  Plan: Zosyn 3.375g IV q8h (4 hour infusion).  Need for further dosage adjustment appears unlikely at present.    Will sign off at this time.  Please reconsult if a change in clinical status warrants re-evaluation of dosage.  Height: 5\' 3"  (160 cm) Weight: 145 lb (65.8 kg) IBW/kg (Calculated) : 52.4  Temp (24hrs), Avg:98.2 F (36.8 C), Min:97.6 F (36.4 C), Max:98.8 F (37.1 C)  Recent Labs  Lab 09/26/18 2246  WBC 5.9  CREATININE 0.60    Estimated Creatinine Clearance: 95.5 mL/min (by C-G formula based on SCr of 0.6 mg/dL).    No Known Allergies  Antimicrobials this admission: 8/29 Ceftriaxone >>  8/29 8/29 Zosyn >>      Thank you for allowing pharmacy to be a part of this patient's care.  Everette Rank, PharmD 09/27/2018 3:11 PM

## 2018-09-27 NOTE — ED Notes (Signed)
Report given to Sylvia, RN

## 2018-09-27 NOTE — ED Notes (Addendum)
ED TO INPATIENT HANDOFF REPORT  ED Nurse Name and Phone #: [place] G, [phone]  S Name/Age/Gender Heather Savage 28 y.o. female Room/Bed: WA02/WA02  Code Status   Code Status: Prior  Home/SNF/Other Home Patient oriented to: self, place, time and situation Is this baseline? Yes   Triage Complete: Triage complete  Chief Complaint Abd. and Back Pain  Triage Note Arrives via EMS from home. C/C upper abd pain x1 hour, dx with gallstones x2 weeks, oxycodone prescription did not touch her pain, denies N/V. Was told she needed her gallbladder removed, just had a baby augsut 11th. EMS gave 100 mcg Fentanyl, 0/10 pain currently. 250 mL NS.   Allergies No Known Allergies  Level of Care/Admitting Diagnosis ED Disposition    ED Disposition Condition Comment   Admit  [facility] [[id number]]  Level of Care: Med-Surg [16]  Covid Evaluation: Asymptomatic Screening Protocol (No Symptoms)  Diagnosis: Cholecystitis [454098][[id number]]  Admitting Physician: CCS, MD [[id number]]  Attending Physician: CCS, MD [[id number]]  Estimated length of stay: past midnight tomorrow  Certification:: I certify this patient will need inpatient services for at least 2 midnights  Bed request comments: 5 west  PT Class (Do Not Modify): Inpatient [101]  PT Acc Code (Do Not Modify): Private [1]       B Medical/Surgery History Past Medical History:  Diagnosis Date  . Anemia    states has low blood count, no current meds.  Marland Kitchen. Hearing loss    right  . History of gastritis    no current med.  Marland Kitchen. History of malaria 2012  . Preterm labor   . Rh negative, maternal   . Tympanic membrane perforation 05/2012   right   Past Surgical History:  Procedure Laterality Date  . EXTERNAL EAR SURGERY    . SCAR REVISION  08/27/2011   Procedure: SCAR REVISION;  Surgeon: Louisa SecondGerald Truesdale, MD;  Location: [facility];  Service: Plastics;  Laterality: N/A;  scar revision of forehead  .  TYMPANOPLASTY  05/14/2011   Procedure: TYMPANOPLASTY;  Surgeon: Serena ColonelJefry Rosen, MD;  Location: [facility];  Service: ENT;  Laterality: Left;  TYMPANOPLASYT AND  POSSIBLE OSSICULARPLASTY   . TYMPANOPLASTY  10/10/2011   Procedure: TYMPANOPLASTY;  Surgeon: Serena ColonelJefry Rosen, MD;  [facility];  Service: ENT;  Laterality: Right;  . TYMPANOPLASTY Right 06/30/2012   Procedure: REVISION RIGHT TYMPANOPLASTY;  Surgeon: Serena ColonelJefry Rosen, MD;  Location: [facility];  Service: ENT;  Laterality: Right;     A IV Location/Drains/Wounds Patient Lines/Drains/Airways Status   Active Line/Drains/Airways    Name:   Placement date:   Placement time:   Site:   Days:   Peripheral IV 09/26/18 Left Antecubital   09/26/18    2159    Antecubital   1   Incision 05/14/11 Ear Left   05/14/11    1056     2693   Incision 05/14/11 Ear Left   05/14/11    1142     2693   Incision 08/27/11 Face Bilateral   08/27/11    1210     2588   Incision 10/10/11 Ear Right   10/10/11    1612     2544   Incision 06/30/12 Ear Right   06/30/12    0926     2280          Intake/Output Last 24 hours No intake or output data in the 24 hours ending 09/27/18 0502  Labs/Imaging  Results for orders placed or performed during the hospital encounter of 09/26/18 (from the past 48 hour(s))  CBC with Differential     Status: Abnormal   Collection Time: 09/26/18 10:46 PM  Result Value Ref Range   WBC 5.9 4.0 - 10.5 K/uL   RBC 3.80 (L) 3.87 - 5.11 MIL/uL   Hemoglobin 11.3 (L) 12.0 - 15.0 g/dL   HCT 28.7 (L) 86.7 - 67.2 %   MCV 92.4 80.0 - 100.0 fL   MCH 29.7 26.0 - 34.0 pg   MCHC 32.2 30.0 - 36.0 g/dL   RDW 09.4 70.9 - 62.8 %   Platelets 277 150 - 400 K/uL   nRBC 0.0 0.0 - 0.2 %   Neutrophils Relative % 63 %   Neutro Abs 3.8 1.7 - 7.7 K/uL   Lymphocytes Relative 27 %   Lymphs Abs 1.6 0.7 - 4.0 K/uL   Monocytes Relative 8 %   Monocytes Absolute 0.5 0.1 - 1.0 K/uL   Eosinophils Relative 1 %   Eosinophils Absolute 0.0 0.0  - 0.5 K/uL   Basophils Relative 1 %   Basophils Absolute 0.0 0.0 - 0.1 K/uL   Immature Granulocytes 0 %   Abs Immature Granulocytes 0.01 0.00 - 0.07 K/uL    Comment: Performed at [facility], [address], [place], [place] [address]  Comprehensive metabolic panel     Status: Abnormal   Collection Time: 09/26/18 10:46 PM  Result Value Ref Range   Sodium 137 135 - 145 mmol/L   Potassium 4.0 3.5 - 5.1 mmol/L   Chloride 106 98 - 111 mmol/L   CO2 22 22 - 32 mmol/L   Glucose, Bld 109 (H) 70 - 99 mg/dL   BUN 20 6 - 20 mg/dL   Creatinine, Ser 4.76 0.44 - 1.00 mg/dL   Calcium 8.7 (L) 8.9 - 10.3 mg/dL   Total Protein 7.1 6.5 - 8.1 g/dL   Albumin 3.6 3.5 - 5.0 g/dL   AST 546 (H) 15 - 41 U/L   ALT 160 (H) 0 - 44 U/L   Alkaline Phosphatase 136 (H) 38 - 126 U/L   Total Bilirubin 0.6 0.3 - 1.2 mg/dL   GFR calc non Af Amer >60 >60 mL/min   GFR calc Af Amer >60 >60 mL/min   Anion gap 9 5 - 15    Comment: Performed at [facility], [address] [place], [place] [address]  Lipase, blood     Status: None   Collection Time: 09/26/18 10:46 PM  Result Value Ref Range   Lipase 35 11 - 51 U/L    Comment: Performed at [facility], [address] [place], [place] [address]  SARS Coronavirus 2 [facility] order, Performed in [facility] hospital lab) Nasopharyngeal Nasopharyngeal Swab     Status: None   Collection Time: 09/27/18  3:28 AM   Specimen: Nasopharyngeal Swab  Result Value Ref Range   SARS Coronavirus 2 NEGATIVE NEGATIVE    Comment: (NOTE) If result is NEGATIVE SARS-CoV-2 target nucleic acids are NOT DETECTED. The SARS-CoV-2 RNA is generally detectable in upper and lower  respiratory specimens during the acute phase of infection. The lowest  concentration of SARS-CoV-2 viral copies this assay can detect is 250  copies / mL. A negative result does not preclude SARS-CoV-2 infection  and should not be used as the sole basis for  treatment or other  patient management decisions.  A negative result may occur with  improper specimen collection / handling, submission of specimen other  than nasopharyngeal swab, presence of viral mutation(s) within the  areas targeted by this assay, and inadequate number of viral copies  (<250 copies / mL). A negative result must be combined with clinical  observations, patient history, and epidemiological information. If result is POSITIVE SARS-CoV-2 target nucleic acids are DETECTED. The SARS-CoV-2 RNA is generally detectable in upper and lower  respiratory specimens dur ing the acute phase of infection.  Positive  results are indicative of active infection with SARS-CoV-2.  Clinical  correlation with patient history and other diagnostic information is  necessary to determine patient infection status.  Positive results do  not rule out bacterial infection or co-infection with other viruses. If result is PRESUMPTIVE POSTIVE SARS-CoV-2 nucleic acids MAY BE PRESENT.   A presumptive positive result was obtained on the submitted specimen  and confirmed on repeat testing.  While 2019 novel coronavirus  (SARS-CoV-2) nucleic acids may be present in the submitted sample  additional confirmatory testing may be necessary for epidemiological  and / or clinical management purposes  to differentiate between  SARS-CoV-2 and other Sarbecovirus currently known to infect humans.  If clinically indicated additional testing with an alternate test  methodology [phone]) is advised. The SARS-CoV-2 RNA is generally  detectable in upper and lower respiratory sp ecimens during the acute  phase of infection. The expected result is Negative. Fact Sheet for Patients:  StrictlyIdeas.no Fact Sheet for Healthcare Providers: BankingDealers.co.za This test is not yet approved or cleared by the [place] FDA and has been authorized for detection and/or  diagnosis of SARS-CoV-2 by FDA under an Emergency Use Authorization (EUA).  This EUA will remain in effect (meaning this test can be used) for the duration of the COVID-19 declaration under Section 564(b)(1) of the Act, 21 U.S.C. section 360bbb-3(b)(1), unless the authorization is terminated or revoked sooner. Performed at [facility], [place] [address] [place], [place] [address]    US Abdomen Limited  Result Date: 09/27/2018 CLINICAL DATA:  Right upper quadrant pain EXAM: ULTRASOUND ABDOMEN LIMITED RIGHT UPPER QUADRANT COMPARISON:  Ultrasound 08/26/2018 FINDINGS: Gallbladder: Multiple shadowing stones measuring up to 9 mm. Normal wall thickness. No sonographic Percell Miller indicated. Common bile duct: Diameter: 4.7 mm Liver: No focal lesion identified. Within normal limits in parenchymal echogenicity. Portal vein is patent on color Doppler imaging with normal direction of blood flow towards the liver. Other: None. IMPRESSION: Cholelithiasis without sonographic evidence for acute cholecystitis or biliary dilatation. Electronically Signed   By: Donavan Foil M.D.   On: 09/27/2018 00:43    Pending Labs Unresulted Labs (From admission, onward)   None      Vitals/Pain Today's Vitals   09/27/18 0130 09/27/18 0146 09/27/18 0200 09/27/18 0500  BP: 109/82  128/76 116/72  Pulse: (!) 50  (!) 53 (!) 55  Resp: 15  14 13   Temp:      TempSrc:      SpO2: 100%  100% 99%  Weight:      Height:      PainSc:  5   3     Isolation Precautions No active isolations  Medications Medications  morphine 4 MG/ML injection 4 mg (4 mg Intravenous Given 09/26/18 2344)    Mobility walks Low fall risk

## 2018-09-27 NOTE — Consult Note (Signed)
Referring Provider:  Dr White/CCS Primary Care Physician:  Patient, No Pcp Per Primary Gastroenterologist: Althia Forts  Reason for Consultation: Abdominal abnormal IOC  HPI: UNDINE NEALIS is a 28 y.o. female who underwent laparoscopic cholecystectomy today for symptomatic cholelithiasis.  Was found to have abnormal IOC with possible multiple small CBD stones.  GI is consulted for further evaluation.  Patient had a spontaneous vaginal delivery on September 09, 2018.  Because of ongoing right upper quadrant pain,she was seen by Dr. Dema Severin  for possible gallbladder issues.  She was scheduled for outpatient surgery after 1 month.  Because of ongoing pain she presented to the ED yesterday.  Initial blood work yesterday showed abnormal LFTs with AST of 243, ALT of 160 and alkaline phosphatase of 136.  Normal total bilirubin.  LFTs were normal earlier in August but they were abnormal in the end of July.  Underwent laparoscopic cholecystectomy today and was found to have minimally inflamed gallbladder, and abnormal IOC concerning for obstructing stone at the ampulla.  GI is consulted for further evaluation and for ERCP.  Patient seen and examined at bedside.  Patient speaks little [place].  Tried to obtain history from interpreter software but was not able to get someone who speaks her language.  Finally, we called her husband.  Hospital course and need for ERCP discussed with the husband.  Patient is feeling somewhat better but lethargic from recent anesthesia. Past Medical History:  Diagnosis Date  . Anemia    states has low blood count, no current meds.  Marland Kitchen Hearing loss    right  . History of gastritis    no current med.  Marland Kitchen History of malaria 2012  . Preterm labor   . Rh negative, maternal   . Tympanic membrane perforation 05/2012   right    Past Surgical History:  Procedure Laterality Date  . EXTERNAL EAR SURGERY    . SCAR REVISION  08/27/2011   Procedure: SCAR REVISION;  Surgeon: Cristine Polio, MD;  Location: [place];  Service: Plastics;  Laterality: N/A;  scar revision of forehead  . TYMPANOPLASTY  05/14/2011   Procedure: TYMPANOPLASTY;  Surgeon: Izora Gala, MD;  Location: [place];  Service: ENT;  Laterality: Left;  TYMPANOPLASYT AND  POSSIBLE OSSICULARPLASTY   . TYMPANOPLASTY  10/10/2011   Procedure: TYMPANOPLASTY;  Surgeon: Izora Gala, MD;  Location: [place];  Service: ENT;  Laterality: Right;  . TYMPANOPLASTY Right 06/30/2012   Procedure: REVISION RIGHT TYMPANOPLASTY;  Surgeon: Izora Gala, MD;  Location: [facility];  Service: ENT;  Laterality: Right;    Prior to Admission medications   Medication Sig Start Date End Date Taking? Authorizing Provider  omeprazole (PRILOSEC) 40 MG capsule Take 1 capsule (40 mg total) by mouth daily. Patient taking differently: Take 40 mg by mouth daily as needed (Heart Burn).  06/11/18  Yes Shelly Bombard, MD  oxyCODONE-acetaminophen (PERCOCET/ROXICET) 5-325 MG tablet Take 1 tablet by mouth every 4 (four) hours as needed for severe pain. 09/11/18  Yes Nicolette Bang, DO  Prenat-FeAsp-Meth-FA-DHA w/o A (PRENATE PIXIE) 10-0.6-0.4-200 MG CAPS Take 1 tablet by mouth daily. 06/11/18  Yes Shelly Bombard, MD  Accu-Chek FastClix Lancets MISC 1 Stick by Percutaneous route 4 (four) times daily. 07/17/18   Sloan Leiter, MD  Blood Glucose Monitoring Suppl (ACCU-CHEK GUIDE) w/Device KIT 1 Device by Percutaneous route 4 (four) times daily. 07/17/18   Sloan Leiter, MD  cyclobenzaprine (FLEXERIL) 10 MG tablet Take  1 tablet (10 mg total) by mouth 2 (two) times daily as needed for muscle spasms. Patient not taking: Reported on 09/26/2018 08/24/18   Wende Mott, CNM  famotidine (PEPCID) 20 MG tablet Take 1 tablet (20 mg total) by mouth 2 (two) times daily. Patient not taking: Reported on 09/26/2018 09/03/18   Woodroe Mode, MD  glucose blood (ACCU-CHEK GUIDE) test strip Use to check blood  sugars four times a day as instructed 07/17/18   Sloan Leiter, MD  ibuprofen (ADVIL) 600 MG tablet Take 1 tablet (600 mg total) by mouth every 6 (six) hours. Patient not taking: Reported on 09/26/2018 09/11/18   Nicolette Bang, DO  senna-docusate (SENOKOT-S) 8.6-50 MG tablet Take 2 tablets by mouth daily. Patient not taking: Reported on 09/26/2018 09/12/18   Nicolette Bang, DO    Scheduled Meds: . fentaNYL      . [MAR Hold] mupirocin ointment  1 application Nasal BID  . promethazine       Continuous Infusions: . cefTRIAXone (ROCEPHIN)  IV 2 g (09/27/18 0649)  . dextrose 5 % and 0.45 % NaCl with KCl 20 mEq/L Stopped (09/27/18 0649)   PRN Meds:.fentaNYL (SUBLIMAZE) injection, HYDROcodone-acetaminophen, morphine injection, ondansetron **OR** ondansetron (ZOFRAN) IV, promethazine, traMADol  Allergies as of 09/26/2018  . (No Known Allergies)    History reviewed. No pertinent family history.  Social History   Socioeconomic History  . Marital status: Single    Spouse name: Not on file  . Number of children: 1  . Years of education: Not on file  . Highest education level: Not on file  Occupational History  . Not on file  Social Needs  . Financial resource strain: Not on file  . Food insecurity    Worry: Not on file    Inability: Not on file  . Transportation needs    Medical: Not on file    Non-medical: Not on file  Tobacco Use  . Smoking status: Never Smoker  . Smokeless tobacco: Never Used  Substance and Sexual Activity  . Alcohol use: Never    Frequency: Never  . Drug use: Never  . Sexual activity: Yes    Birth control/protection: None  Lifestyle  . Physical activity    Days per week: 0 days    Minutes per session: 0 min  . Stress: Not at all  Relationships  . Social Herbalist on phone: Not on file    Gets together: Not on file    Attends religious service: Not on file    Active member of club or organization: Not on file     Attends meetings of clubs or organizations: Not on file    Relationship status: Not on file  . Intimate partner violence    Fear of current or ex partner: Not on file    Emotionally abused: Not on file    Physically abused: Not on file    Forced sexual activity: Not on file  Other Topics Concern  . Not on file  Social History Narrative   Immigrated from [place] in March 15, 2011.  [place] family background.  Planning on becoming permanent resident.  Lives with boyfriend.  Was living in refugee [place]- no other family in [place].  [place]    Review of Systems: All negative except as stated above in HPI.  Physical Exam: Vital signs: Vitals:   09/27/18 1321 09/27/18 1330  BP:  122/70  Pulse: 63 (!) 59  Resp: 13  13  Temp:    SpO2: 100% 100%   Last BM Date: 09/26/18 Physical Exam  Constitutional: She is oriented to person, place, and time. She appears well-developed and well-nourished. No distress.  HENT:  Head: Normocephalic and atraumatic.  Mouth/Throat: Oropharynx is clear and moist. No oropharyngeal exudate.  Eyes: EOM are normal. No scleral icterus.  Neck: Normal range of motion. Neck supple.  Cardiovascular: Normal rate and regular rhythm.  Pulmonary/Chest: Effort normal and breath sounds normal. No respiratory distress.  Abdominal: Soft. Bowel sounds are normal. She exhibits no distension. There is no abdominal tenderness. There is no rebound.  Midline scar noted  Musculoskeletal: Normal range of motion.        General: No edema.  Neurological: She is alert and oriented to person, place, and time.  Skin: Skin is warm. No erythema.  Psychiatric: She has a normal mood and affect. Judgment and thought content normal.  Vitals reviewed.   GI:  Lab Results: Recent Labs    09/26/18 2246  WBC 5.9  HGB 11.3*  HCT 35.1*  PLT 277   BMET Recent Labs    09/26/18 2246  NA 137  K 4.0  CL 106  CO2 22  GLUCOSE 109*  BUN 20  CREATININE 0.60  CALCIUM 8.7*    LFT Recent Labs    09/26/18 2246  PROT 7.1  ALBUMIN 3.6  AST 243*  ALT 160*  ALKPHOS 136*  BILITOT 0.6   PT/INR No results for input(s): LABPROT, INR in the last 72 hours.   Studies/Results: Dg Cholangiogram Operative  Result Date: 09/27/2018 CLINICAL DATA:  Cholecystectomy for cholelithiasis. EXAM: INTRAOPERATIVE CHOLANGIOGRAM TECHNIQUE: Cholangiographic images from the C-arm fluoroscopic device were submitted for interpretation post-operatively. Please see the procedural report for the amount of contrast and the fluoroscopy time utilized. COMPARISON:  Right upper quadrant ultrasound on 09/26/2018 FINDINGS: Intraoperative cholangiogram demonstrates multiple probable small filling defects in the distal common bile duct causing relative obstruction to the flow of contrast through the ampulla with no visualization of the duodenum. The common bile duct appears mildly dilated. There is some contrast extravasation at the injection site at the level of the cystic duct. IMPRESSION: Filling defects in the distal common bile duct with no visualization of contrast entering the duodenum. Findings are consistent with probable choledocholithiasis. The common bile duct appears mildly dilated. Electronically Signed   By: Aletta Edouard M.D.   On: 09/27/2018 13:21   US Abdomen Limited  Result Date: 09/27/2018 CLINICAL DATA:  Right upper quadrant pain EXAM: ULTRASOUND ABDOMEN LIMITED RIGHT UPPER QUADRANT COMPARISON:  Ultrasound 08/26/2018 FINDINGS: Gallbladder: Multiple shadowing stones measuring up to 9 mm. Normal wall thickness. No sonographic Percell Miller indicated. Common bile duct: Diameter: 4.7 mm Liver: No focal lesion identified. Within normal limits in parenchymal echogenicity. Portal vein is patent on color Doppler imaging with normal direction of blood flow towards the liver. Other: None. IMPRESSION: Cholelithiasis without sonographic evidence for acute cholecystitis or biliary dilatation.  Electronically Signed   By: Donavan Foil M.D.   On: 09/27/2018 00:43    Impression/Plan: -Abnormal IOC concerning for filling defect in the common bile duct. -Abnormal LFTs.  Probably from above -Postpartum.  Recommendations ----------------------- - Tried to obtain history from interpreter software but was not able to get someone who speaks her language.  Finally, we called her husband.  Hospital course and need for ERCP discussed with the husband.   -Case discussed with Dr. Dema Severin as well as Dr. Fuller Plan.  Tentative plan for  ERCP tomorrow.  If not able to get ERCP done tomorrow, we will plan it for Monday.  .Risks (post ERCP pancreatitis ,bleeding, infection, bowel perforation that could require surgery, sedation-related changes in cardiopulmonary systems), benefits (identification and possible treatment of source of symptoms, exclusion of certain causes of symptoms), and alternatives (watchful waiting, radiographic imaging studies, empiric medical treatment)  were explained to patient and family in detail and patient wishes to proceed.    LOS: 0 days   Otis Brace  MD, [place] 09/27/2018, 1:41 PM  Contact #  [phone]

## 2018-09-27 NOTE — Anesthesia Procedure Notes (Signed)
Procedure Name: Intubation Performed by: Gean Maidens, CRNA Pre-anesthesia Checklist: Patient identified, Emergency Drugs available, Suction available, Patient being monitored and Timeout performed Patient Re-evaluated:Patient Re-evaluated prior to induction Oxygen Delivery Method: Circle system utilized Preoxygenation: Pre-oxygenation with 100% oxygen Induction Type: IV induction and Rapid sequence Laryngoscope Size: Mac and 3 Grade View: Grade I Tube type: Oral Tube size: 7.0 mm Number of attempts: 1 Airway Equipment and Method: Stylet Placement Confirmation: ETT inserted through vocal cords under direct vision,  positive ETCO2 and breath sounds checked- equal and bilateral Secured at: 21 cm Tube secured with: Tape Dental Injury: Teeth and Oropharynx as per pre-operative assessment

## 2018-09-27 NOTE — H&P (Addendum)
Re:   Heather Savage DOB:   [birth year] MRN:   [id number]  Chief Complaint Abdominal pain  ASSESEMENT AND PLAN: 1.  Cholecystitis, cholelithiasis  I discussed with the patient the indications and risks of gall bladder surgery.  The primary risks of gall bladder surgery include, but are not limited to, bleeding, infection, common bile duct injury, and open surgery.  There is also the risk that the patient may have continued symptoms after surgery.  We discussed the typical post-operative recovery course. I tried to answer the patient's questions.  [Seen by Dr. Dema Severin in our office 09/22/2018]  2.  Hearing loss 3.  Vaginal delivery - 09/09/2018 4.  Gestational diabetes - ?  Better now  Chief Complaint  Patient presents with  . Abdominal Pain   PHYSICIAN REQUESTING CONSULTATION:  Abigail Butts, PA, Karenann Cai  HISTORY OF PRESENT ILLNESS: Heather Savage is a 28 y.o. (DOB: 1990-05-31)  AA female whose primary care physician is Patient, No Pcp Per.  Her husband, Danelle Earthly, acted as Optometrist.  They are from [place], [place].  She speaks no [place].   The patient is an [place] F recent spontaneous vaginal delivery 09/09/2018 who reports a 2-3 month history of intermittent epigastric, right upper quadrant abdominal pain. This began during her pregnancy.  She saw Dr. Dema Severin in our office on 09/22/2018 for gall bladder disease and discussed surgery, trying to wait a month out from her delivery.  But she developed continued pain and came to the [facility].  [place] of abdomen - 09/27/2018 - Cholelithiasis without sonographic evidence for acute cholecystitis or biliary dilatation.  WBC - 09/26/2018 - 5,900 LFT - 09/26/2018 - AST - 243, ALT - 160   Past Medical History:  Diagnosis Date  . Anemia    states has low blood count, no current meds.  Marland Kitchen Hearing loss    right  . History of gastritis    no current med.  Marland Kitchen History of malaria 2012  . Preterm labor   . Rh negative, maternal   . 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 EXTERNAL EAR SURGERY    . SCAR REVISION  08/27/2011   Procedure: SCAR REVISION;  Surgeon: Cristine Polio, MD;  Location: [place];  Service: Plastics;  Laterality: N/A;  scar revision of forehead  . TYMPANOPLASTY  05/14/2011   Procedure: TYMPANOPLASTY;  Surgeon: Izora Gala, MD;  Location: [place];  Service: ENT;  Laterality: Left;  TYMPANOPLASYT AND  POSSIBLE OSSICULARPLASTY   . TYMPANOPLASTY  10/10/2011   Procedure: TYMPANOPLASTY;  Surgeon: Izora Gala, MD;  Location: [place];  Service: ENT;  Laterality: Right;  . TYMPANOPLASTY Right 06/30/2012   Procedure: REVISION RIGHT TYMPANOPLASTY;  Surgeon: Izora Gala, MD;  Location: [place];  Service: ENT;  Laterality: Right;      No current facility-administered medications for this encounter.    Current Outpatient Medications  Medication Sig Dispense Refill  . omeprazole (PRILOSEC) 40 MG capsule Take 1 capsule (40 mg total) by mouth daily. (Patient taking differently: Take 40 mg by mouth daily as needed (Heart Burn). ) 30 capsule 0  . oxyCODONE-acetaminophen (PERCOCET/ROXICET) 5-325 MG tablet Take 1 tablet by mouth every 4 (four) hours as needed for severe pain. 30 tablet 0  . Prenat-FeAsp-Meth-FA-DHA w/o A (PRENATE PIXIE) 10-0.6-0.4-200 MG CAPS Take 1 tablet by mouth daily. 30 capsule 12  . Accu-Chek [employer]  Lancets MISC 1 Stick by Percutaneous route 4 (four) times daily. 100 each 12  . Blood Glucose Monitoring Suppl (ACCU-CHEK GUIDE) w/Device KIT 1 Device by Percutaneous route 4 (four) times daily. 1 kit 0  . cyclobenzaprine (FLEXERIL) 10 MG tablet Take 1 tablet (10 mg total) by mouth 2 (two) times daily as needed for muscle spasms. (Patient not taking: Reported on 09/26/2018) 10 tablet 0  . famotidine (PEPCID) 20 MG tablet Take 1 tablet (20 mg total) by mouth 2 (two) times daily. (Patient not taking:  Reported on 09/26/2018) 60 tablet 2  . glucose blood (ACCU-CHEK GUIDE) test strip Use to check blood sugars four times a day as instructed 50 each 12  . ibuprofen (ADVIL) 600 MG tablet Take 1 tablet (600 mg total) by mouth every 6 (six) hours. (Patient not taking: Reported on 09/26/2018) 30 tablet 1  . senna-docusate (SENOKOT-S) 8.6-50 MG tablet Take 2 tablets by mouth daily. (Patient not taking: Reported on 09/26/2018) 20 tablet 0     No Known Allergies  REVIEW OF SYSTEMS: Skin:  No history of rash.  No history of abnormal moles. Infection:  No history of hepatitis or HIV.  No history of MRSA. Neurologic:  No history of stroke.  No history of seizure.  No history of headaches. Hearing loss. Cardiac:  No history of hypertension. No history of heart disease.  No history of prior cardiac catheterization.  No history of seeing a cardiologist. Pulmonary:  Does not smoke cigarettes.  No asthma or bronchitis.  No OSA/CPAP.  Endocrine:  Gestational diabetes.  No thyroid disease. Gastrointestinal:  See HPI. Urologic:  No history of kidney stones.  No history of bladder infections. Musculoskeletal:  No history of joint or back disease. Hematologic:  No bleeding disorder.  No history of anemia.  Not anticoagulated. Psycho-social:  The patient is oriented.   The patient has no obvious psychologic or social impairment to understanding our conversation and plan.  SOCIAL and FAMILY HISTORY: Married.  Husband, Danelle Earthly, at beside. Has son, 77 yo, and daughter, 54 week old.  She does not work. They are from [place], [place].  She speaks no [place] - her husband acts as Optometrist.   Her husband works as a Education officer, museum - Water engineer ??  PHYSICAL EXAM: BP 128/76   Pulse (!) 53   Temp 97.6 F (36.4 C) (Oral)   Resp 14   Ht 5' 3"  (1.6 m)   Wt 65.8 kg   LMP 12/22/2017 (Exact Date)   SpO2 100%   BMI 25.69 kg/m   General: Smallish AA F who is alert. Skin:  Inspection and palpation - no  mass or rash. Eyes:  Conjunctiva and lids unremarkable.            Pupils are equal  Neck: Supple. No mass, trachea midline.  No thyroid mass. Lymph Nodes:  No supraclavicular, cervical, or inguinal nodes. Lungs: Normal respiratory effort.  Clear to auscultation and symmetric breath sounds. Heart:  Palpation of the heart is normal.            Auscultation: RRR. No murmur or rub.  Abdomen: Soft. No mass.  No hernia. Normal bowel sounds.  No abdominal scars.  Tender RUQ, mild. Rectal: Not done. Musculoskeletal:  Good muscle strength and ROM  in upper and lower extremities.  Neurologic:  Grossly intact to motor and sensory function. Psychiatric: Could not assess because of language barrier.  DATA REVIEWED, COUNSELING AND COORDINATION OF CARE: Epic notes reviewed. Counseling  and coordination of care exceeded more than 50% of the time spent with patient. Total time spent with patient and charting: 45 minutes.  Alphonsa Overall, MD,  [facility], [address]  [place], [place]    [place] Phone:  [phone] FAX:  [phone]

## 2018-09-27 NOTE — H&P (View-Only) (Signed)
Referring Provider:  Dr White/[facility] Primary Care Physician:  Patient, No Pcp Per Primary Gastroenterologist: Althia Forts  Reason for Consultation: Abdominal abnormal IOC  HPI: Heather Savage is a 28 y.o. female who underwent laparoscopic cholecystectomy today for symptomatic cholelithiasis.  Was found to have abnormal IOC with possible multiple small CBD stones.  GI is consulted for further evaluation.  Patient had a spontaneous vaginal delivery on September 09, 2018.  Because of ongoing right upper quadrant pain,she was seen by Dr. Dema Severin  for possible gallbladder issues.  She was scheduled for outpatient surgery after 1 month.  Because of ongoing pain she presented to the ED yesterday.  Initial blood work yesterday showed abnormal LFTs with AST of 243, ALT of 160 and alkaline phosphatase of 136.  Normal total bilirubin.  LFTs were normal earlier in August but they were abnormal in the end of July.  Underwent laparoscopic cholecystectomy today and was found to have minimally inflamed gallbladder, and abnormal IOC concerning for obstructing stone at the ampulla.  GI is consulted for further evaluation and for ERCP.  Patient seen and examined at bedside.  Patient speaks little [place].  Tried to obtain history from interpreter software but was not able to get someone who speaks her language.  Finally, we called her husband.  Hospital course and need for ERCP discussed with the husband.  Patient is feeling somewhat better but lethargic from recent anesthesia. Past Medical History:  Diagnosis Date  . Anemia    states has low blood count, no current meds.  Marland Kitchen Hearing loss    right  . History of gastritis    no current med.  Marland Kitchen History of malaria 2012  . Preterm labor   . Rh negative, maternal   . Tympanic membrane perforation 05/2012   right    Past Surgical History:  Procedure Laterality Date  . EXTERNAL EAR SURGERY    . SCAR REVISION  08/27/2011   Procedure: SCAR REVISION;  Surgeon: Cristine Polio, MD;  Location: [place];  Service: Plastics;  Laterality: N/A;  scar revision of forehead  . TYMPANOPLASTY  05/14/2011   Procedure: TYMPANOPLASTY;  Surgeon: Izora Gala, MD;  Location: [place];  Service: ENT;  Laterality: Left;  TYMPANOPLASYT AND  POSSIBLE OSSICULARPLASTY   . TYMPANOPLASTY  10/10/2011   Procedure: TYMPANOPLASTY;  Surgeon: Izora Gala, MD;  Location: [place];  Service: ENT;  Laterality: Right;  . TYMPANOPLASTY Right 06/30/2012   Procedure: REVISION RIGHT TYMPANOPLASTY;  Surgeon: Izora Gala, MD;  Location: [place];  Service: ENT;  Laterality: Right;    Prior to Admission medications   Medication Sig Start Date End Date Taking? Authorizing Provider  omeprazole (PRILOSEC) 40 MG capsule Take 1 capsule (40 mg total) by mouth daily. Patient taking differently: Take 40 mg by mouth daily as needed (Heart Burn).  06/11/18  Yes Shelly Bombard, MD  oxyCODONE-acetaminophen (PERCOCET/ROXICET) 5-325 MG tablet Take 1 tablet by mouth every 4 (four) hours as needed for severe pain. 09/11/18  Yes Nicolette Bang, DO  Prenat-FeAsp-Meth-FA-DHA w/o A (PRENATE PIXIE) 10-0.6-0.4-200 MG CAPS Take 1 tablet by mouth daily. 06/11/18  Yes Shelly Bombard, MD  Accu-Chek [employer] Lancets MISC 1 Stick by Percutaneous route 4 (four) times daily. 07/17/18   Sloan Leiter, MD  Blood Glucose Monitoring Suppl (ACCU-CHEK GUIDE) w/Device KIT 1 Device by Percutaneous route 4 (four) times daily. 07/17/18   Sloan Leiter, MD  cyclobenzaprine (FLEXERIL) 10 MG tablet Take  1 tablet (10 mg total) by mouth 2 (two) times daily as needed for muscle spasms. Patient not taking: Reported on 09/26/2018 08/24/18   Wende Mott, CNM  famotidine (PEPCID) 20 MG tablet Take 1 tablet (20 mg total) by mouth 2 (two) times daily. Patient not taking: Reported on 09/26/2018 09/03/18   Woodroe Mode, MD  glucose blood (ACCU-CHEK GUIDE) test strip Use to check blood  sugars four times a day as instructed 07/17/18   Sloan Leiter, MD  ibuprofen (ADVIL) 600 MG tablet Take 1 tablet (600 mg total) by mouth every 6 (six) hours. Patient not taking: Reported on 09/26/2018 09/11/18   Nicolette Bang, DO  senna-docusate (SENOKOT-S) 8.6-50 MG tablet Take 2 tablets by mouth daily. Patient not taking: Reported on 09/26/2018 09/12/18   Nicolette Bang, DO    Scheduled Meds: . fentaNYL      . [MAR Hold] mupirocin ointment  1 application Nasal BID  . promethazine       Continuous Infusions: . cefTRIAXone (ROCEPHIN)  IV 2 g (09/27/18 0649)  . dextrose 5 % and 0.45 % NaCl with KCl 20 mEq/L Stopped (09/27/18 0649)   PRN Meds:.fentaNYL (SUBLIMAZE) injection, HYDROcodone-acetaminophen, morphine injection, ondansetron **OR** ondansetron (ZOFRAN) IV, promethazine, traMADol  Allergies as of 09/26/2018  . (No Known Allergies)    History reviewed. No pertinent family history.  Social History   Socioeconomic History  . Marital status: Single    Spouse name: Not on file  . Number of children: 1  . Years of education: Not on file  . Highest education level: Not on file  Occupational History  . Not on file  Social Needs  . Financial resource strain: Not on file  . Food insecurity    Worry: Not on file    Inability: Not on file  . Transportation needs    Medical: Not on file    Non-medical: Not on file  Tobacco Use  . Smoking status: Never Smoker  . Smokeless tobacco: Never Used  Substance and Sexual Activity  . Alcohol use: Never    Frequency: Never  . Drug use: Never  . Sexual activity: Yes    Birth control/protection: None  Lifestyle  . Physical activity    Days per week: 0 days    Minutes per session: 0 min  . Stress: Not at all  Relationships  . Social Herbalist on phone: Not on file    Gets together: Not on file    Attends religious service: Not on file    Active member of club or organization: Not on file     Attends meetings of clubs or organizations: Not on file    Relationship status: Not on file  . Intimate partner violence    Fear of current or ex partner: Not on file    Emotionally abused: Not on file    Physically abused: Not on file    Forced sexual activity: Not on file  Other Topics Concern  . Not on file  Social History Narrative   Immigrated from [place] in March 15, 2011.  [place] family background.  Planning on becoming permanent resident.  Lives with boyfriend.  [place] living [place]- no other family in [place].  [employer]    Review of Systems: All negative except as stated above in HPI.  Physical Exam: Vital signs: Vitals:   09/27/18 1321 09/27/18 1330  BP:  122/70  Pulse: 63 (!) 59  Resp: 13  13  Temp:    SpO2: 100% 100%   Last BM Date: 09/26/18 Physical Exam  Constitutional: She is oriented to person, place, and time. She appears well-developed and well-nourished. No distress.  HENT:  Head: Normocephalic and atraumatic.  Mouth/Throat: Oropharynx is clear and moist. No oropharyngeal exudate.  Eyes: EOM are normal. No scleral icterus.  Neck: Normal range of motion. Neck supple.  Cardiovascular: Normal rate and regular rhythm.  Pulmonary/Chest: Effort normal and breath sounds normal. No respiratory distress.  Abdominal: Soft. Bowel sounds are normal. She exhibits no distension. There is no abdominal tenderness. There is no rebound.  Midline scar noted  Musculoskeletal: Normal range of motion.        General: No edema.  Neurological: She is alert and oriented to person, place, and time.  Skin: Skin is warm. No erythema.  Psychiatric: She has a normal mood and affect. Judgment and thought content normal.  Vitals reviewed.   GI:  Lab Results: Recent Labs    09/26/18 2246  WBC 5.9  HGB 11.3*  HCT 35.1*  PLT 277   BMET Recent Labs    09/26/18 2246  NA 137  K 4.0  CL 106  CO2 22  GLUCOSE 109*  BUN 20  CREATININE 0.60  CALCIUM 8.7*    LFT Recent Labs    09/26/18 2246  PROT 7.1  ALBUMIN 3.6  AST 243*  ALT 160*  ALKPHOS 136*  BILITOT 0.6   PT/INR No results for input(s): LABPROT, INR in the last 72 hours.   Studies/Results: Dg Cholangiogram Operative  Result Date: 09/27/2018 CLINICAL DATA:  Cholecystectomy for cholelithiasis. EXAM: INTRAOPERATIVE CHOLANGIOGRAM TECHNIQUE: Cholangiographic images from the C-arm fluoroscopic device were submitted for interpretation post-operatively. Please see the procedural report for the amount of contrast and the fluoroscopy time utilized. COMPARISON:  Right upper quadrant ultrasound on 09/26/2018 FINDINGS: Intraoperative cholangiogram demonstrates multiple probable small filling defects in the distal common bile duct causing relative obstruction to the flow of contrast through the ampulla with no visualization of the duodenum. The common bile duct appears mildly dilated. There is some contrast extravasation at the injection site at the level of the cystic duct. IMPRESSION: Filling defects in the distal common bile duct with no visualization of contrast entering the duodenum. Findings are consistent with probable choledocholithiasis. The common bile duct appears mildly dilated. Electronically Signed   By: Aletta Edouard M.D.   On: 09/27/2018 13:21   US Abdomen Limited  Result Date: 09/27/2018 CLINICAL DATA:  Right upper quadrant pain EXAM: ULTRASOUND ABDOMEN LIMITED RIGHT UPPER QUADRANT COMPARISON:  Ultrasound 08/26/2018 FINDINGS: Gallbladder: Multiple shadowing stones measuring up to 9 mm. Normal wall thickness. No sonographic Percell Miller indicated. Common bile duct: Diameter: 4.7 mm Liver: No focal lesion identified. Within normal limits in parenchymal echogenicity. Portal vein is patent on color Doppler imaging with normal direction of blood flow towards the liver. Other: None. IMPRESSION: Cholelithiasis without sonographic evidence for acute cholecystitis or biliary dilatation.  Electronically Signed   By: Donavan Foil M.D.   On: 09/27/2018 00:43    Impression/Plan: -Abnormal IOC concerning for filling defect in the common bile duct. -Abnormal LFTs.  Probably from above -Postpartum.  Recommendations ----------------------- - Tried to obtain history from interpreter software but was not able to get someone who speaks her language.  Finally, we called her husband.  Hospital course and need for ERCP discussed with the husband.   -Case discussed with Dr. Dema Severin as well as Dr. Fuller Plan.  Tentative plan for  ERCP tomorrow.  If not able to get ERCP done tomorrow, we will plan it for Monday.  .Risks (post ERCP pancreatitis ,bleeding, infection, bowel perforation that could require surgery, sedation-related changes in cardiopulmonary systems), benefits (identification and possible treatment of source of symptoms, exclusion of certain causes of symptoms), and alternatives (watchful waiting, radiographic imaging studies, empiric medical treatment)  were explained to patient and family in detail and patient wishes to proceed.    LOS: 0 days   Otis Brace  MD, [place] 09/27/2018, 1:41 PM  Contact #  [phone]

## 2018-09-27 NOTE — ED Notes (Addendum)
Patient given breast milk pump and supplies, MD instructed her to dump breast milk, patient verbalized understanding. Patient is to be NPO, sx around 9 am this morning.

## 2018-09-27 NOTE — Plan of Care (Signed)
  Problem: Clinical Measurements: Goal: Will remain free from infection Outcome: Progressing   Problem: Clinical Measurements: Goal: Respiratory complications will improve Outcome: Progressing   Problem: Clinical Measurements: Goal: Cardiovascular complication will be avoided Outcome: Progressing   

## 2018-09-27 NOTE — Op Note (Signed)
09/26/2018 - 09/27/2018 12:35 PM  PATIENT: Heather Savage  28 y.o. female  Patient Care Team: Patient, No Pcp Per as PCP - General ([facility]) Kinnie Feil, MD (Family Medicine)  PRE-OPERATIVE DIAGNOSIS: Symptomatic cholelithiasis, possible choledocholithiasis  POST-OPERATIVE DIAGNOSIS: Symptomatic cholelithiasis; choledocholithiasis  PROCEDURE: Laparoscopic cholecystectomy with intraoperative cholangiogram  SURGEON: Sharon Mt. Dema Severin, MD  ASSISTANT: Alphonsa Overall, MD  ANESTHESIA: General endotracheal  EBL: Total I/O In: 1000 [I.V.:1000] Out: 10 [Blood:10]  DRAINS: None  SPECIMEN: Gallbladder  COUNTS: Sponge, needle and instrument counts were reported correct x2 at the conclusion of the operation  DISPOSITION: PACU in satisfactory condition  COMPLICATIONS: None  FINDINGS: Minimally inflamed appearing gallbladder. Dilated cystic duct. Critical view of safety obtained prior to clipping or dividing any structures. Cystic ductotomy made for cholangiogram and small stones were noted to be spontaneously effluxing from cystic duct despite this being clamped, suggesting stones effluxing from common bile duct. Cholangiogram demonstrated appropriate anatomy with opacification of cystic duct; common bile duct appeared to be obstructed at ampulla. Despite flushing maneuvers, this did not clear. Her cystic duct was quite fragile with ductotomy extending with just placement of cholangiocatheter. Therefore a trans-cystic common duct exploration was not attempted and plans made for GI consultation postoperatively.  INDICATION: Heather Savage is a very pleasant 28yoF with hx of gestational diabetes whom had a SVD 09/09/2018. She developed intermittent RUQ pain throughout her 3rd trimester. She was evaluated and found to have gallstones and additionally transient elevation in her LFTs. She delivered her child and followed up in our office. After evaluating her, we had discussed  proceeding with laparoscopic cholecystectomy. In the interim, she presented to the ED overnight with a recurrent "attack" of sharp persistent RUQ pain. Her liver enzymes were elevated. Her bilirubin was normal. Her lipase was normal. RUQ US demonstrated gallstones without sonographic evidence of cholecystitis. CBD measured 4.7 mm. Options were discussed and she opted to pursue surgery. Please refer to notes elsewhere for details regarding this discussion.  DESCRIPTION:  The patient was identified & brought into the operating room. She was then positioned supine on the OR table. SCDs were in place and active during the entire case. She then underwent general endotracheal anesthesia. Pressure points were padded. The abdomen was prepped and draped in the standard sterile fashion. Antibiotics were administered. A surgical timeout was performed and confirmed our plan.   A periumbilical incision was made. The umbilical stalk was grasped and retracted outwardly. The infraumbilical fascia was identified and incised. The peritoneal cavity was gently entered bluntly. A purse-string 0 Vicryl suture was placed. The Hasson cannula was inserted into the peritoneal cavity and insufflation with CO2 commenced to 68mmHg. A laparoscope was inserted into the peritoneal cavity and inspection confirmed no evidence of trocar site complications. The patient was then positioned in reverse Trendelenburg with slight left side down. 3 additional 54mm trocars were placed along the right subcostal line - one 26mm port in mid subcostal region, another 52mm port in the right flank near the anterior axillary line, and a third 50mm port in the left subxiphoid region obliquely near the falciform ligament.  The liver and gallbladder were inspected. The gallbladder was full and minimally inflamed in appearance. The gallbladder fundus was grasped and elevated cephalad. An additional grasper was then placed on the infundibulum of the gallbladder and  the infundibulum was retracted laterally. Staying high on the gallbladder, the peritoneum on both sides of the gallbladder was opened with hook cautery. Gentle blunt dissection was  then employed with a [place] gallery [place] dissector working down into Comcast[employer]ot's triangle. The cystic duct was identified and carefully circumferentially dissected. It appeared enlarged. The cystic artery was also identified and carefully circumferentially dissected. A small anterior and posterior cystic artery were present. The space between the cystic artery and hepatocystic plate was developed such that a good view of the liver could be seen through a window medial to the cystic artery. The triangle of Calot had been cleared of all fibrofatty tissue. At this point, a critical view of safety was achieved and the only structures visualized was the skeletonized cystic duct laterally, the skeletonized cystic artery and the liver through the window medial to the anterior/posterior cystic artery.  At this point attention was turned to performing a cholangiogram. A partial cystic duct-otomy was created. There were very small stones that were noted to freely efflux from the cystic ductotomy despite this being clamped. These appeared to be coming from a more proximal location. A Taut cholangiocatheter was then crimped and introduced through a puncture site at the right subcostal ridge of the abdominal wall and directed it into the cystic duct. With just gentle manipulation the dilated, thin walled cystic duct was noted to dilate at the ductotomy. This catheter was then secured with a clip. A cholangiogram was then obtained using a dilute radio-opaque contrast and continuous fluoroscopy. Contrast flowed from a side branch consistent with cystic duct cannulization. Contrast flowed proximally up the common hepatic duct into the right and left intrahepatic chains out to secondary radicals. Contrast flowed distally into the common bile duct and terminated at the  ampulla, the duodenum did not fill.  The duct was flushed with saline and despite this maneuver the duct did not clear.  This was consistent with a normal cholangiogram. The cholangiocatheter was then removed. Given the fragility of her cystic duct likely from dilation, a trans-cystic common duct exploration was not attempted.  The cystic ductotomy was completed. This was then grasped and 2 endoloops placed on the cystic duct stump. These were checked and noted to be secure. The cystic artery was then clipped with 2 clips on the patient side and 1 clip on the specimen side. The cystic artery were then divided. The gallbladder was then freed from its remaining attachments to the liver using electrocautery and placed into an endocatch bag. The RUQ was gently irrigated with sterile saline. Hemostasis was then verified. The clips were in good position as was the endoloops; the gallbladder fossa was dry. The rest of the abdomen was inspected no injury nor bleeding elsewhere was identified.  The endocatch bag containing the gallbladder was then removed from the umbilical port site and passed off as specimen. The RUQ ports were removed under direct visualization and noted to be hemostatic. The umbilical fascia was then closed using the 0 Vicryl purse-string suture. The fascia was palpated and noted to be completely closed. The skin of all incision sites was approximated with 4-0 monocryl subcuticular suture and dermabond applied. She was then awakened from anesthesia, extubated, and transferred to a stretcher for transport to PACU in satisfactory condition.  Dr. Levora AngelBrahmbhatt with gastroenterology has been notified for evaluation for potential ERCP. I have updated her husband over the phone as well.

## 2018-09-27 NOTE — Progress Notes (Signed)
Subjective No acute events. RUQ pain is better this morning than last night. Denies n/v.  Objective: Vital signs in last 24 hours: Temp:  [97.6 F (36.4 C)-98.8 F (37.1 C)] 98.8 F (37.1 C) (08/29 0631) Pulse Rate:  [50-70] 65 (08/29 0631) Resp:  [13-20] 20 (08/29 0631) BP: (102-131)/(56-84) 102/68 (08/29 0631) SpO2:  [98 %-100 %] 98 % (08/29 0631) Weight:  [65.8 kg] 65.8 kg (08/28 2209) Last BM Date: 09/26/18  Intake/Output from previous day: 08/28 0701 - 08/29 0700 In: 59.2 [I.V.:59.2] Out: -  Intake/Output this shift: No intake/output data recorded.  Gen: NAD, comfortable CV: RRR Pulm: Normal work of breathing Abd: Soft, not significantly tender at this time; nondistended; no rebound; no guarding Ext: SCDs in place  Lab Results: CBC  Recent Labs    09/26/18 2246  WBC 5.9  HGB 11.3*  HCT 35.1*  PLT 277   BMET Recent Labs    09/26/18 2246  NA 137  K 4.0  CL 106  CO2 22  GLUCOSE 109*  BUN 20  CREATININE 0.60  CALCIUM 8.7*   PT/INR No results for input(s): LABPROT, INR in the last 72 hours. ABG No results for input(s): PHART, HCO3 in the last 72 hours.  Invalid input(s): PCO2, PO2  Studies/Results:  Anti-infectives: Anti-infectives (From admission, onward)   Start     Dose/Rate Route Frequency Ordered Stop   09/27/18 0600  cefTRIAXone (ROCEPHIN) 2 g in sodium chloride 0.9 % 100 mL IVPB     2 g 200 mL/hr over 30 Minutes Intravenous Every 24 hours 09/27/18 0557         Assessment/Plan: Patient Active Problem List   Diagnosis Date Noted  . Cholecystitis 09/27/2018  . Indication for care in labor or delivery 09/08/2018  . Cholelithiasis affecting pregnancy in third trimester, antepartum 08/29/2018  . Threatened premature labor in third trimester 08/29/2018  . Preterm labor 08/26/2018  . Gestational diabetes 07/16/2018  . E. coli UTI (urinary tract infection) 03/26/2018  . Supervision of other normal pregnancy, antepartum 03/19/2018  . Rh  negative status during pregnancy 02/08/2016  . Language barrier 01/02/2016  . Hearing loss 05/01/2011   [name] with hx of symptomatic cholelithiasis whom has recurring severe attacks of her pain - was seen in the office this past week by myself for this and was undergoing planning for cholecystectomy in 2 weeks.  She denies any prior abdominal surgeries or procedures  -The anatomy and physiology of the hepatobiliary system was discussed at length with the patient and her husband with associated pictures - her husband aided some in translation - she had requested he serve as her translator as well and not a phone interpreter. The pathophysiology of gallbladder disease was discussed at length with associated pictures. -The options for treatment were discussed including ongoing observation which may result in subsequent gallbladder complications (infection, pancreatitis, choledocholithiasis, etc). -We discussed laparoscopic cholecystectomy with possible intraoperative cholangiogram. We discussed possible open techniques as well. -The planned procedure, material risks (including, but not limited to, pain, bleeding, infection, scarring, need for blood transfusion, damage to surrounding structures- blood vessels/nerves/viscus/organs, damage to bile duct, bile leak, need for additional procedures, hernia, worsening of pre-existing medical conditions, pancreatitis, pneumonia, heart attack, stroke, death) benefits and alternatives to surgery were discussed at length. I noted a good probability that the procedure would help improve her symptoms. The patient's and her husband's questions were answered to their satisfaction, they voiced understanding and they elected to  proceed with surgery. Additionally, we discussed typical postoperative expectations and the recovery process.   LOS: 0 days   Sharon Mt. Dema Severin, M.D. [facility]

## 2018-09-27 NOTE — Anesthesia Preprocedure Evaluation (Addendum)
Anesthesia Evaluation  Patient identified by MRN, date of birth, ID band Patient awake    Reviewed: Allergy & Precautions, NPO status , Patient's Chart, lab work & pertinent test results  Airway Mallampati: II  TM Distance: >3 FB Neck ROM: Full    Dental  (+) Dental Advisory Given   Pulmonary neg pulmonary ROS,    breath sounds clear to auscultation       Cardiovascular negative cardio ROS   Rhythm:Regular Rate:Normal     Neuro/Psych negative neurological ROS     GI/Hepatic negative GI ROS, Acute cholecystitis and choledocolithiasis   Endo/Other  negative endocrine ROS  Renal/GU negative Renal ROS     Musculoskeletal   Abdominal   Peds  Hematology  (+) anemia ,   Anesthesia Other Findings   Reproductive/Obstetrics 18 days postpartum.                            Anesthesia Physical Anesthesia Plan  ASA: II and emergent  Anesthesia Plan: General   Post-op Pain Management:    Induction: Intravenous and Rapid sequence  PONV Risk Score and Plan: 3 and Dexamethasone, Ondansetron and Treatment may vary due to age or medical condition  Airway Management Planned: Oral ETT  Additional Equipment:   Intra-op Plan:   Post-operative Plan: Extubation in OR  Informed Consent: I have reviewed the patients History and Physical, chart, labs and discussed the procedure including the risks, benefits and alternatives for the proposed anesthesia with the patient or authorized representative who has indicated his/her understanding and acceptance.     Dental advisory given  Plan Discussed with: CRNA  Anesthesia Plan Comments:        Anesthesia Quick Evaluation

## 2018-09-27 NOTE — Anesthesia Postprocedure Evaluation (Signed)
Anesthesia Post Note  Patient: Heather Savage  Procedure(s) Performed: LAPAROSCOPIC CHOLECYSTECTOMY WITH INTRAOPERATIVE CHOLANGIOGRAM (N/A Abdomen)     Patient location during evaluation: PACU Anesthesia Type: General Level of consciousness: awake and alert Pain management: pain level controlled Vital Signs Assessment: post-procedure vital signs reviewed and stable Respiratory status: spontaneous breathing, nonlabored ventilation, respiratory function stable and patient connected to nasal cannula oxygen Cardiovascular status: blood pressure returned to baseline and stable Postop Assessment: no apparent nausea or vomiting Anesthetic complications: no    Last Vitals:  Vitals:   09/27/18 1345 09/27/18 1358  BP: 113/71 109/68  Pulse: 62 66  Resp: 10 12  Temp:  36.9 C  SpO2: 99% 99%    Last Pain:  Vitals:   09/27/18 1358  TempSrc:   PainSc: 0-No pain                 Tiajuana Amass

## 2018-09-27 NOTE — Transfer of Care (Signed)
Immediate Anesthesia Transfer of Care Note  Patient: Heather Savage  Procedure(s) Performed: LAPAROSCOPIC CHOLECYSTECTOMY WITH INTRAOPERATIVE CHOLANGIOGRAM (N/A Abdomen)  Patient Location: PACU  Anesthesia Type:General  Level of Consciousness: awake, alert  and oriented  Airway & Oxygen Therapy: Patient Spontanous Breathing and Patient connected to face mask oxygen  Post-op Assessment: Report given to RN and Post -op Vital signs reviewed and stable  Post vital signs: Reviewed and stable  Last Vitals:  Vitals Value Taken Time  BP    Temp    Pulse 81 09/27/18 1253  Resp 11 09/27/18 1253  SpO2 98 % 09/27/18 1253  Vitals shown include unvalidated device data.  Last Pain:  Vitals:   09/27/18 0801  TempSrc:   PainSc: 2       Patients Stated Pain Goal: 2 (78/58/85 0277)  Complications: No apparent anesthesia complications

## 2018-09-27 NOTE — Progress Notes (Signed)
Pt back from pacu in stable condition. No needs at time of arrival to floor . Pt was resting without complication and denied pain.

## 2018-09-28 ENCOUNTER — Inpatient Hospital Stay (HOSPITAL_COMMUNITY): Payer: Medicaid Other

## 2018-09-28 ENCOUNTER — Encounter (HOSPITAL_COMMUNITY): Payer: Self-pay | Admitting: Surgery

## 2018-09-28 ENCOUNTER — Inpatient Hospital Stay (HOSPITAL_COMMUNITY): Payer: Medicaid Other | Admitting: Certified Registered"

## 2018-09-28 ENCOUNTER — Encounter (HOSPITAL_COMMUNITY): Admission: EM | Disposition: A | Discharge status: Home / Self Care | Payer: Self-pay | Source: Home / Self Care

## 2018-09-28 DIAGNOSIS — R7401 Elevation of levels of liver transaminase levels: Secondary | ICD-10-CM

## 2018-09-28 DIAGNOSIS — R748 Abnormal levels of other serum enzymes: Secondary | ICD-10-CM

## 2018-09-28 DIAGNOSIS — R932 Abnormal findings on diagnostic imaging of liver and biliary tract: Secondary | ICD-10-CM

## 2018-09-28 DIAGNOSIS — R1011 Right upper quadrant pain: Secondary | ICD-10-CM

## 2018-09-28 DIAGNOSIS — K8050 Calculus of bile duct without cholangitis or cholecystitis without obstruction: Secondary | ICD-10-CM

## 2018-09-28 HISTORY — PX: SPHINCTEROTOMY: SHX5544

## 2018-09-28 HISTORY — PX: REMOVAL OF STONES: SHX5545

## 2018-09-28 HISTORY — PX: ERCP: SHX5425

## 2018-09-28 LAB — COMPREHENSIVE METABOLIC PANEL
ALT: 226 U/L — ABNORMAL HIGH (ref 0–44)
AST: 160 U/L — ABNORMAL HIGH (ref 15–41)
Albumin: 3.5 g/dL (ref 3.5–5.0)
Alkaline Phosphatase: 160 U/L — ABNORMAL HIGH (ref 38–126)
Anion gap: 11 (ref 5–15)
BUN: 14 mg/dL (ref 6–20)
CO2: 23 mmol/L (ref 22–32)
Calcium: 8.5 mg/dL — ABNORMAL LOW (ref 8.9–10.3)
Chloride: 103 mmol/L (ref 98–111)
Creatinine, Ser: 0.62 mg/dL (ref 0.44–1.00)
GFR calc Af Amer: >60 mL/min (ref >60)
GFR calc non Af Amer: >60 mL/min (ref >60)
Glucose, Bld: 115 mg/dL — ABNORMAL HIGH (ref 70–99)
Potassium: 4 mmol/L (ref 3.5–5.1)
Sodium: 137 mmol/L (ref 135–145)
Total Bilirubin: 1 mg/dL (ref 0.3–1.2)
Total Protein: 6.9 g/dL (ref 6.5–8.1)

## 2018-09-28 LAB — CBC
HCT: 38.2 % (ref 36.0–46.0)
Hemoglobin: 12.4 g/dL (ref 12.0–15.0)
MCH: 30.1 pg (ref 26.0–34.0)
MCHC: 32.5 g/dL (ref 30.0–36.0)
MCV: 92.7 fL (ref 80.0–100.0)
Platelets: 333 10*3/uL (ref 150–400)
RBC: 4.12 MIL/uL (ref 3.87–5.11)
RDW: 12.8 % (ref 11.5–15.5)
WBC: 6 10*3/uL (ref 4.0–10.5)
nRBC: 0 % (ref 0.0–0.2)

## 2018-09-28 SURGERY — ERCP, WITH INTERVENTION IF INDICATED
Anesthesia: General

## 2018-09-28 MED ORDER — SODIUM CHLORIDE 0.9 % IV SOLN
INTRAVENOUS | Status: DC | PRN
  Administered 2018-09-28: 23 mL

## 2018-09-28 MED ORDER — SUGAMMADEX SODIUM 200 MG/2ML IV SOLN
INTRAVENOUS | Status: DC | PRN
  Administered 2018-09-28: 200 mg via INTRAVENOUS

## 2018-09-28 MED ORDER — SCOPOLAMINE 1 MG/3DAYS TD PT72
MEDICATED_PATCH | TRANSDERMAL | Status: DC | PRN
  Administered 2018-09-28: 1 via TRANSDERMAL

## 2018-09-28 MED ORDER — SCOPOLAMINE 1 MG/3DAYS TD PT72
MEDICATED_PATCH | TRANSDERMAL | Status: AC
  Filled 2018-09-28: qty 1

## 2018-09-28 MED ORDER — INDOMETHACIN 50 MG RE SUPP
RECTAL | Status: AC
  Filled 2018-09-28: qty 2

## 2018-09-28 MED ORDER — PROPOFOL 10 MG/ML IV BOLUS
INTRAVENOUS | Status: DC | PRN
  Administered 2018-09-28: 130 mg via INTRAVENOUS

## 2018-09-28 MED ORDER — INDOMETHACIN 50 MG RE SUPP
RECTAL | Status: DC | PRN
  Administered 2018-09-28: 100 mg via RECTAL

## 2018-09-28 MED ORDER — PROPOFOL 10 MG/ML IV BOLUS
INTRAVENOUS | Status: AC
  Filled 2018-09-28: qty 20

## 2018-09-28 MED ORDER — LIDOCAINE 2% (20 MG/ML) 5 ML SYRINGE
INTRAMUSCULAR | Status: DC | PRN
  Administered 2018-09-28: 60 mg via INTRAVENOUS

## 2018-09-28 MED ORDER — FENTANYL CITRATE (PF) 250 MCG/5ML IJ SOLN
INTRAMUSCULAR | Status: DC | PRN
  Administered 2018-09-28: 100 ug via INTRAVENOUS

## 2018-09-28 MED ORDER — FENTANYL CITRATE (PF) 100 MCG/2ML IJ SOLN
INTRAMUSCULAR | Status: AC
  Filled 2018-09-28: qty 2

## 2018-09-28 MED ORDER — GLUCAGON HCL RDNA (DIAGNOSTIC) 1 MG IJ SOLR
INTRAMUSCULAR | Status: DC | PRN
  Administered 2018-09-28: 0.25 mg via INTRAVENOUS

## 2018-09-28 MED ORDER — GLUCAGON HCL RDNA (DIAGNOSTIC) 1 MG IJ SOLR
INTRAMUSCULAR | Status: AC
  Filled 2018-09-28: qty 1

## 2018-09-28 MED ORDER — DEXAMETHASONE SODIUM PHOSPHATE 10 MG/ML IJ SOLN
INTRAMUSCULAR | Status: DC | PRN
  Administered 2018-09-28: 8 mg via INTRAVENOUS

## 2018-09-28 MED ORDER — SODIUM CHLORIDE 0.9 % IV BOLUS: 1000.0000 mL | Freq: Once | INTRAVENOUS | Status: DC

## 2018-09-28 MED ORDER — ROCURONIUM BROMIDE 10 MG/ML (PF) SYRINGE
PREFILLED_SYRINGE | INTRAVENOUS | Status: DC | PRN
  Administered 2018-09-28: 50 mg via INTRAVENOUS

## 2018-09-28 MED ORDER — MIDAZOLAM HCL 2 MG/2ML IJ SOLN
INTRAMUSCULAR | Status: AC
  Filled 2018-09-28: qty 2

## 2018-09-28 MED ORDER — SODIUM CHLORIDE 0.9 % IV SOLN: INTRAVENOUS | Status: DC

## 2018-09-28 MED ORDER — MIDAZOLAM HCL 2 MG/2ML IJ SOLN
INTRAMUSCULAR | Status: DC | PRN
  Administered 2018-09-28: 2 mg via INTRAVENOUS

## 2018-09-28 NOTE — Anesthesia Postprocedure Evaluation (Signed)
Anesthesia Post Note  Patient: Heather Savage  Procedure(s) Performed: ENDOSCOPIC RETROGRADE CHOLANGIOPANCREATOGRAPHY (ERCP) (N/A ) SPHINCTEROTOMY     Patient location during evaluation: PACU Anesthesia Type: General Level of consciousness: awake and alert Pain management: pain level controlled Vital Signs Assessment: post-procedure vital signs reviewed and stable Respiratory status: spontaneous breathing, nonlabored ventilation, respiratory function stable and patient connected to nasal cannula oxygen Cardiovascular status: blood pressure returned to baseline and stable Postop Assessment: no apparent nausea or vomiting Anesthetic complications: no    Last Vitals:  Vitals:   09/28/18 1310 09/28/18 1320  BP: 132/78 131/78  Pulse: 60 (!) 56  Resp: 15 (!) 22  Temp:    SpO2: 97% 99%    Last Pain:  Vitals:   09/28/18 1255  TempSrc: Oral  PainSc:                  [name]

## 2018-09-28 NOTE — Progress Notes (Signed)
[facility] Office:  [phone] General Surgery Progress Note   LOS: 1 day  POD -  1 Day Post-Op  Chief Complaint: Abdominal pain  Assessment and Plan: 1.  LAPAROSCOPIC CHOLECYSTECTOMY WITH INTRAOPERATIVE CHOLANGIOGRAM - 09/28/2018 - [place]  2.  CBD stones  LFT's mildly elevated  For ERCP today - [place] 3.  Hearing loss 4.  Vaginal delivery - 09/09/2018 5.  DVT prophylaxis - on hold for procedure   Active Problems:   Cholecystitis   S/P laparoscopic cholecystectomy  Subjective:  Doing okay.  Minimal pain  Objective:   Vitals:   09/28/18 0139 09/28/18 0528  BP: 115/68 111/78  Pulse: (!) 58 (!) 54  Resp: 14 14  Temp: 98.6 F (37 C) 98.2 F (36.8 C)  SpO2: 100% 100%     Intake/Output from previous day:  08/29 0701 - 08/30 0700 In: 1669.5 [P.O.:240; I.V.:1420.1; IV Piggyback:9.4] Out: 10 [Blood:10]  Intake/Output this shift:  No intake/output data recorded.   Physical Exam:   General: WN AA F who is alert and oriented.    HEENT: Normal. Pupils equal. .   Lungs: Clear   Abdomen: Soft.  Has BS.   Wound: Look good   Lab Results:    Recent Labs    09/26/18 2246 09/28/18 0321  WBC 5.9 6.0  HGB 11.3* 12.4  HCT 35.1* 38.2  PLT 277 333    BMET   Recent Labs    09/26/18 2246 09/28/18 0321  NA 137 137  K 4.0 4.0  CL 106 103  CO2 22 23  GLUCOSE 109* 115*  BUN 20 14  CREATININE 0.60 0.62  CALCIUM 8.7* 8.5*    PT/INR  No results for input(s): LABPROT, INR in the last 72 hours.  ABG  No results for input(s): PHART, HCO3 in the last 72 hours.  Invalid input(s): PCO2, PO2   Studies/Results:  Dg Cholangiogram Operative  Result Date: 09/27/2018 CLINICAL DATA:  Cholecystectomy for cholelithiasis. EXAM: INTRAOPERATIVE CHOLANGIOGRAM TECHNIQUE: Cholangiographic images from the C-arm fluoroscopic device were submitted for interpretation post-operatively. Please see the procedural report for the amount of contrast and the fluoroscopy time  utilized. COMPARISON:  Right upper quadrant ultrasound on 09/26/2018 FINDINGS: Intraoperative cholangiogram demonstrates multiple probable small filling defects in the distal common bile duct causing relative obstruction to the flow of contrast through the ampulla with no visualization of the duodenum. The common bile duct appears mildly dilated. There is some contrast extravasation at the injection site at the level of the cystic duct. IMPRESSION: Filling defects in the distal common bile duct with no visualization of contrast entering the duodenum. Findings are consistent with probable choledocholithiasis. The common bile duct appears mildly dilated. Electronically Signed   By: Aletta Edouard M.D.   On: 09/27/2018 13:21   US Abdomen Limited  Result Date: 09/27/2018 CLINICAL DATA:  Right upper quadrant pain EXAM: ULTRASOUND ABDOMEN LIMITED RIGHT UPPER QUADRANT COMPARISON:  Ultrasound 08/26/2018 FINDINGS: Gallbladder: Multiple shadowing stones measuring up to 9 mm. Normal wall thickness. No sonographic Percell Miller indicated. Common bile duct: Diameter: 4.7 mm Liver: No focal lesion identified. Within normal limits in parenchymal echogenicity. Portal vein is patent on color Doppler imaging with normal direction of blood flow towards the liver. Other: None. IMPRESSION: Cholelithiasis without sonographic evidence for acute cholecystitis or biliary dilatation. Electronically Signed   By: Donavan Foil M.D.   On: 09/27/2018 00:43     Anti-infectives:   Anti-infectives (From admission, onward)   Start     Dose/Rate  Route Frequency Ordered Stop   09/27/18 1600  piperacillin-tazobactam (ZOSYN) IVPB 3.375 g     3.375 g 12.5 mL/hr over 240 Minutes Intravenous Every 8 hours 09/27/18 1515     09/27/18 0600  cefTRIAXone (ROCEPHIN) 2 g in sodium chloride 0.9 % 100 mL IVPB  Status:  Discontinued     2 g 200 mL/hr over 30 Minutes Intravenous Every 24 hours 09/27/18 0557 09/27/18 1428      Ovidio Kinavid Ayshia Gramlich, MD,  FACS Pager: [phone] [facility] Office: [phone] 09/28/2018

## 2018-09-28 NOTE — Op Note (Addendum)
[facility] Patient Name: Heather Savage Procedure Date: 09/28/2018 MRN: [id number] Attending MD: Ladene Artist , MD Date of Birth: 25-Jun-1990 CSN: [id number] Age: 28 Admit Type: Inpatient Procedure:                ERCP Indications:              Abdominal pain of suspected biliary origin, Filling                            defects and no flow into duodenum on intraoperative                            cholangiogram, Elevated liver enzymes Providers:                Pricilla Riffle. Fuller Plan, MD, Burtis Junes, RN, Ladona Ridgel, Technician Referring MD:             Sharon Mt. White, MD Medicines:                General Anesthesia Complications:            No immediate complications. Estimated Blood Loss:     Estimated blood loss: none. Procedure:                Pre-Anesthesia Assessment:                           - Prior to the procedure, a History and Physical                            was performed, and patient medications and                            allergies were reviewed. The patient's tolerance of                            previous anesthesia was also reviewed. The risks                            and benefits of the procedure and the sedation                            options and risks were discussed with the patient.                            All questions were answered, and informed consent                            was obtained. Prior Anticoagulants: The patient has                            taken no previous anticoagulant or antiplatelet  agents. ASA Grade Assessment: II - A patient with                            mild systemic disease. After reviewing the risks                            and benefits, the patient was deemed in                            satisfactory condition to undergo the procedure.                           After obtaining informed consent, the scope was                             passed under direct vision. Throughout the                            procedure, the patient's blood pressure, pulse, and                            oxygen saturations were monitored continuously. The                            TJF-Q180V ([id number]) Olympus duodenoscope was                            introduced through the mouth, and used to inject                            contrast into and used to inject contrast into the                            bile duct. The ERCP was accomplished without                            difficulty. The patient tolerated the procedure                            well. Scope In: Scope Out: Findings:      A scout film of the abdomen was obtained. Surgical clips, consistent       with a previous cholecystectomy, were seen in the area of the right       upper quadrant of the abdomen. The esophagus was successfully intubated       under direct vision. The scope was advanced to a normal major papilla in       the descending duodenum without detailed examination of the pharynx,       larynx and associated structures, and upper GI tract. The upper GI tract       was grossly normal. A straight Roadrunner wire was passed into the       biliary tree. The short-nosed traction sphincterotome was passed over       the guidewire and the bile duct was then deeply cannulated. Contrast was  injected. I personally interpreted the bile duct images. There was       appropriate flow of contrast through the ducts. A cholecystectomy had       been performed. The common bile duct was mildly dilated and contained       filling defects thought to be small stones and sludge. A 7 mm biliary       sphincterotomy was made with a traction (standard) sphincterotome using       ERBE electrocautery. There was no post-sphincterotomy bleeding. The       biliary tree was swept with a 9 mm balloon 3 times starting at the       bifurcation. Sludge was swept from the duct. No stones or sludge        remained. Excellent bliary drainage noted. The PD was not cannulated or       injected by intention. Impression:               - Filling defects consistent with small stones and                            sludge noted on the cholangiogram.                           - Prior cholecystectomy.                           - Mildly dilated CBD.                           - Sludge, stones and complete removal was                            accomplished by biliary sphincterotomy and balloon                            extraction.                           - Otherwise normal appearing post cholecystectomy                            cholangiogram. Moderate Sedation:      Not Applicable - Patient had care per Anesthesia. Recommendation:           - Avoid aspirin and nonsteroidal anti-inflammatory                            medicines for 1 week following Indocin post ERCP.                           - Observe patient's clinical course following                            today's ERCP with therapeutic intervention.                           - Trend LFTs, anticipate return to normal over a  few weeks.                           - Clear liquids today.                           - GI follow up per Dr. Levora AngelBrahmbhatt Procedure Code(s):        --- Professional ---                           [phone], Endoscopic retrograde                            cholangiopancreatography (ERCP); with removal of                            calculi/debris from biliary/pancreatic duct(s)                           [phone], Endoscopic retrograde                            cholangiopancreatography (ERCP); with                            sphincterotomy/papillotomy Diagnosis Code(s):        --- Professional ---                           R93.2, Abnormal findings on diagnostic imaging of                            liver and biliary tract                           Z90.49, Acquired absence of other specified parts                             of digestive tract                           K80.50, Calculus of bile duct without cholangitis                            or cholecystitis without obstruction                           R10.9, Unspecified abdominal pain                           R74.8, Abnormal levels of other serum enzymes CPT copyright 2019 American Medical Association. All rights reserved. The codes documented in this report are preliminary and upon coder review may  be revised to meet current compliance requirements. Meryl DareMalcolm T , MD 09/28/2018 1:16:02 PM This report has been signed electronically. Number of Addenda: 0

## 2018-09-28 NOTE — Plan of Care (Signed)

## 2018-09-28 NOTE — Transfer of Care (Signed)
Immediate Anesthesia Transfer of Care Note  Patient: Heather Savage  Procedure(s) Performed: ENDOSCOPIC RETROGRADE CHOLANGIOPANCREATOGRAPHY (ERCP) (N/A )  Patient Location: PACU and Endoscopy Unit  Anesthesia Type:General  Level of Consciousness: awake, alert  and oriented  Airway & Oxygen Therapy: Patient Spontanous Breathing and Patient connected to face mask oxygen  Post-op Assessment: Report given to RN and Post -op Vital signs reviewed and stable  Post vital signs: Reviewed and stable  Last Vitals:  Vitals Value Taken Time  BP    Temp    Pulse 74 09/28/18 1255  Resp 17 09/28/18 1255  SpO2 100 % 09/28/18 1255  Vitals shown include unvalidated device data.  Last Pain:  Vitals:   09/28/18 1138  TempSrc: Temporal  PainSc: 8       Patients Stated Pain Goal: 3 (68/08/81 1031)  Complications: No apparent anesthesia complications

## 2018-09-28 NOTE — Anesthesia Preprocedure Evaluation (Addendum)
Anesthesia Evaluation  Patient identified by MRN, date of birth, ID band Patient awake    Reviewed: Allergy & Precautions, NPO status , Patient's Chart, lab work & pertinent test results  Airway Mallampati: II  TM Distance: >3 FB Neck ROM: Full    Dental  (+) Teeth Intact, Dental Advisory Given   Pulmonary neg pulmonary ROS,    breath sounds clear to auscultation       Cardiovascular negative cardio ROS   Rhythm:Regular Rate:Normal     Neuro/Psych negative neurological ROS  negative psych ROS   GI/Hepatic Neg liver ROS, GERD  Medicated,  Endo/Other  diabetes  Renal/GU negative Renal ROS     Musculoskeletal negative musculoskeletal ROS (+)   Abdominal Normal abdominal exam  (+)   Peds  Hematology   Anesthesia Other Findings   Reproductive/Obstetrics                            Anesthesia Physical Anesthesia Plan  ASA: II  Anesthesia Plan: General   Post-op Pain Management:    Induction: Intravenous  PONV Risk Score and Plan: 4 or greater and Ondansetron, Dexamethasone, Midazolam and Scopolamine patch - Pre-op  Airway Management Planned: Oral ETT  Additional Equipment: None  Intra-op Plan:   Post-operative Plan: Extubation in OR  Informed Consent: I have reviewed the patients History and Physical, chart, labs and discussed the procedure including the risks, benefits and alternatives for the proposed anesthesia with the patient or authorized representative who has indicated his/her understanding and acceptance.     Dental advisory given  Plan Discussed with: CRNA  Anesthesia Plan Comments:        Anesthesia Quick Evaluation

## 2018-09-28 NOTE — Interval H&P Note (Signed)
History and Physical Interval Note:  09/28/2018 11:54 AM  Heather Savage  has presented today for surgery, with the diagnosis of abnormal IOC.  The various methods of treatment have been discussed with the patient and family. After consideration of risks, benefits and other options for treatment, the patient has consented to  Procedure(s): ENDOSCOPIC RETROGRADE CHOLANGIOPANCREATOGRAPHY (ERCP) (N/A) as a surgical intervention.  The patient's history has been reviewed, patient examined, no change in status, stable for surgery.  I have reviewed the patient's chart and labs.  Questions were answered to the patient's satisfaction.     Pricilla Riffle. Fuller Plan

## 2018-09-28 NOTE — Anesthesia Procedure Notes (Signed)
Procedure Name: Intubation Date/Time: 09/28/2018 12:17 PM Performed by: Eben Burow, CRNA Pre-anesthesia Checklist: Patient identified, Emergency Drugs available, Suction available and Patient being monitored Patient Re-evaluated:Patient Re-evaluated prior to induction Oxygen Delivery Method: Circle system utilized Preoxygenation: Pre-oxygenation with 100% oxygen Induction Type: IV induction Ventilation: Mask ventilation without difficulty Laryngoscope Size: Mac and 3 Grade View: Grade I Tube type: Oral Tube size: 7.0 mm Number of attempts: 1 Airway Equipment and Method: Stylet and Oral airway Placement Confirmation: ETT inserted through vocal cords under direct vision,  positive ETCO2 and breath sounds checked- equal and bilateral Secured at: 21 cm Tube secured with: Tape Dental Injury: Teeth and Oropharynx as per pre-operative assessment

## 2018-09-29 LAB — COMPREHENSIVE METABOLIC PANEL
ALT: 165 U/L — ABNORMAL HIGH (ref 0–44)
AST: 76 U/L — ABNORMAL HIGH (ref 15–41)
Albumin: 3.6 g/dL (ref 3.5–5.0)
Alkaline Phosphatase: 128 U/L — ABNORMAL HIGH (ref 38–126)
Anion gap: 10 (ref 5–15)
BUN: 15 mg/dL (ref 6–20)
CO2: 23 mmol/L (ref 22–32)
Calcium: 8.6 mg/dL — ABNORMAL LOW (ref 8.9–10.3)
Chloride: 105 mmol/L (ref 98–111)
Creatinine, Ser: 0.55 mg/dL (ref 0.44–1.00)
GFR calc Af Amer: >60 mL/min (ref >60)
GFR calc non Af Amer: >60 mL/min (ref >60)
Glucose, Bld: 121 mg/dL — ABNORMAL HIGH (ref 70–99)
Potassium: 3.8 mmol/L (ref 3.5–5.1)
Sodium: 138 mmol/L (ref 135–145)
Total Bilirubin: 0.6 mg/dL (ref 0.3–1.2)
Total Protein: 6.5 g/dL (ref 6.5–8.1)

## 2018-09-29 MED ORDER — ACETAMINOPHEN 500 MG PO TABS: 1000.0000 mg | ORAL_TABLET | Freq: Four times a day (QID) | ORAL | 0 refills | Status: DC

## 2018-09-29 MED ORDER — TRAMADOL HCL 50 MG PO TABS: 50.0000 mg | ORAL_TABLET | Freq: Four times a day (QID) | ORAL | 0 refills | Status: DC | PRN

## 2018-09-29 NOTE — Discharge Instructions (Signed)
I spoke with the pharmacist here in the hospital. Please pump and dump x 2 before returning to breast feeding.   [facility]  Please arrive at least 30 min before your appointment to complete your check in paperwork.  If you are unable to arrive 30 min prior to your appointment time we may have to cancel or reschedule you. LAPAROSCOPIC SURGERY: POST OP INSTRUCTIONS Always review your discharge instruction sheet given to you by the facility where your surgery was performed. IF YOU HAVE DISABILITY OR FAMILY LEAVE FORMS, YOU MUST BRING THEM TO THE OFFICE FOR PROCESSING.   DO NOT GIVE THEM TO YOUR DOCTOR.  PAIN CONTROL  1. First take acetaminophen (Tylenol) to control your pain after surgery.  Follow directions on package.  Taking acetaminophen (Tylenol) regularly after surgery will help to control your pain and lower the amount of prescription pain medication you may need.  You should not take more than 4,000 mg (4 grams) of acetaminophen (Tylenol) in 24 hours.  You should not take ibuprofen (Advil), aleve, motrin, naprosyn or other NSAIDS for the next week per GI.   2. A prescription for pain medication may be given to you upon discharge.  Take your pain medication as prescribed, if you still have uncontrolled pain after taking acetaminophen (Tylenol) 3. Use ice packs to help control pain. 4. If you need a refill on your pain medication, please contact your pharmacy.  They will contact our office to request authorization. Prescriptions will not be filled after 5pm or on week-ends.  HOME MEDICATIONS 5. Take your usually prescribed medications unless otherwise directed.  DIET 6. You should follow a light diet the first few days after arrival home.  Be sure to include lots of fluids daily. Avoid fatty, fried foods. See handout below.   CONSTIPATION 7. It is common to experience some constipation after surgery and if you are taking pain medication.  Increasing fluid intake  and taking a stool softener (such as Colace) will usually help or prevent this problem from occurring.  A mild laxative (Milk of Magnesia or Miralax) should be taken according to package instructions if there are no bowel movements after 48 hours.  WOUND/INCISION CARE 8. Most patients will experience some swelling and bruising in the area of the incisions.  Ice packs will help.  Swelling and bruising can take several days to resolve.  9. Unless discharge instructions indicate otherwise, follow guidelines below  a. STERI-STRIPS - you may remove your outer bandages 48 hours after surgery, and you may shower at that time.  You have steri-strips (small skin tapes) in place directly over the incision.  These strips should be left on the skin for 7-10 days.   b. DERMABOND/SKIN GLUE - you may shower in 24 hours.  The glue will flake off over the next 2-3 weeks. 10. Any sutures or staples will be removed at the office during your follow-up visit.  ACTIVITIES 11. You may resume regular (light) daily activities beginning the next day--such as daily self-care, walking, climbing stairs--gradually increasing activities as tolerated.  You may have sexual intercourse when it is comfortable.  Refrain from any heavy lifting or straining until approved by your doctor. a. You may drive when you are no longer taking prescription pain medication, you can comfortably wear a seatbelt, and you can safely maneuver your car and apply brakes.  FOLLOW-UP 12. You should see your doctor in the office for a follow-up appointment approximately 2-3 weeks after your  surgery.  You should have been given your post-op/follow-up appointment when your surgery was scheduled.  If you did not receive a post-op/follow-up appointment, make sure that you call for this appointment within a day or two after you arrive home to insure a convenient appointment time.   WHEN TO CALL YOUR DOCTOR: 1. Fever over 101.0 2. Inability to  urinate 3. Continued bleeding from incision. 4. Increased pain, redness, or drainage from the incision. 5. Increasing abdominal pain  The clinic staff is available to answer your questions during regular business hours.  Please dont hesitate to call and ask to speak to one of the nurses for clinical concerns.  If you have a medical emergency, go to the nearest emergency room or call 911.  A surgeon [facility] is always on call at the hospital. [address], [address], [place], [place]  [id number] ? [address] [address], [place], [place]   [id number] [phone] ? [phone] ? FAX [phone]   Gallbladder Eating Plan If you have a gallbladder condition, you may have trouble digesting fats. Eating a low-fat diet can help reduce your symptoms, and may be helpful before and after having surgery to remove your gallbladder (cholecystectomy). Your health care provider may recommend that you work with a diet and nutrition specialist (dietitian) to help you reduce the amount of fat in your diet. What are tips for following this plan? General guidelines  Limit your fat intake to less than 30% of your total daily calories. If you eat around 1,800 calories each day, this is less than 60 grams (g) of fat per day.  Fat is an important part of a healthy diet. Eating a low-fat diet can make it hard to maintain a healthy body weight. Ask your dietitian how much fat, calories, and other nutrients you need each day.  Eat small, frequent meals throughout the day instead of three large meals.  Drink at least 8-10 cups of fluid a day. Drink enough fluid to keep your urine clear or pale yellow.  Limit alcohol intake to no more than 1 drink a day for nonpregnant women and 2 drinks a day for men. One drink equals 12 oz of beer, 5 oz of wine, or 1 oz of hard liquor. Reading food labels  Check Nutrition Facts on food labels for the amount of fat per serving. Choose foods with less than 3 grams  of fat per serving. Shopping  Choose nonfat and low-fat healthy foods. Look for the words nonfat, low fat, or fat free.  Avoid buying processed or prepackaged foods. Cooking  Cook using low-fat methods, such as baking, broiling, grilling, or boiling.  Cook with small amounts of healthy fats, such as olive oil, grapeseed oil, canola oil, or sunflower oil. What foods are recommended?   All fresh, frozen, or canned fruits and vegetables.  Whole grains.  Low-fat or non-fat (skim) milk and yogurt.  Lean meat, skinless poultry, fish, eggs, and beans.  Low-fat protein supplement powders or drinks.  Spices and herbs. What foods are not recommended?  High-fat foods. These include baked goods, fast food, fatty cuts of meat, ice cream, french toast, sweet rolls, pizza, cheese bread, foods covered with butter, creamy sauces, or cheese.  Fried foods. These include french fries, tempura, battered fish, breaded chicken, fried breads, and sweets.  Foods with strong odors.  Foods that cause bloating and gas. Summary  A low-fat diet can be helpful if you have a gallbladder condition, or before  and after gallbladder surgery.  Limit your fat intake to less than 30% of your total daily calories. This is about 60 g of fat if you eat 1,800 calories each day.  Eat small, frequent meals throughout the day instead of three large meals. This information is not intended to replace advice given to you by your health care provider. Make sure you discuss any questions you have with your health care provider. Document Released: 01/20/2013 Document Revised: 05/08/2018 Document Reviewed: 02/23/2016 Elsevier Patient Education  2020 [employer].

## 2018-09-29 NOTE — Progress Notes (Signed)
RN used the Language line to assess pain level, needs and  give the patient medication (See MAR).

## 2018-09-29 NOTE — Progress Notes (Signed)
RN used the Language line to assess pain level, needs and  give the patient medication (See MAR). 

## 2018-09-29 NOTE — Discharge Summary (Signed)
Heather Savage ID: Heather Savage [id number] 1990/05/11 28 y.o.  Admit date: 09/26/2018 Discharge date: 09/29/2018  Admitting Diagnosis: Cholecystitis, cholelithiasis Recent Vaginal delivery - 09/09/2018 Gestational diabetes - ? Better now  Discharge Diagnosis Heather Savage Active Problem List   Diagnosis Date Noted  . Elevated transaminase level   . Right upper quadrant abdominal pain   . Abnormal cholangiogram   . Cholecystitis 09/27/2018  . S/P laparoscopic cholecystectomy 09/27/2018  . Indication for care in labor or delivery 09/08/2018  . Cholelithiasis affecting pregnancy in third trimester, antepartum 08/29/2018  . Threatened premature labor in third trimester 08/29/2018  . Preterm labor 08/26/2018  . Gestational diabetes 07/16/2018  . E. coli UTI (urinary tract infection) 03/26/2018  . Supervision of other normal pregnancy, antepartum 03/19/2018  . Rh negative status during pregnancy 02/08/2016  . Language barrier 01/02/2016  . Hearing loss 05/01/2011    Consultants Gastroenterology - Dr. Alessandra Savage  H&P:  Heather Savage is a 28 y.o. (DOB: 08-19-1990)  AA female whose primary care physician is Heather Savage, Heather Savage.  Her husband, Heather Savage, acted as Optometrist.  They are from [place], [place].  She speaks Heather [place].  The Heather Savage is an Heather Savage recent spontaneous vaginal delivery 09/09/2018 who reports a 2-3 month history of intermittent epigastric, right upper quadrant abdominal pain. This began during her pregnancy.  She saw Dr. Dema Savage in our office on 09/22/2018 for gall bladder disease and discussed surgery, trying to wait a month out from her delivery. But she developed continued pain and came to the [facility].  [place] of abdomen - 09/27/2018 - Cholelithiasis without sonographic evidence for acute cholecystitis or biliary dilatation. WBC - 09/26/2018 - 5,900. LFT - 09/26/2018 - AST - 243, ALT - 160  Procedures Laparoscopic Cholecystectomy with IOC - Dr. Dema Savage - 09/27/2018  ERCP - Dr. Fuller Savage - 09/28/2018  Hospital Course:  The Heather Savage was admitted and underwent a laparoscopic cholecystectomy with IOC. The Heather Savage tolerated the procedure well. Her IOC was noted to be positive for which GI was consulted. She underwent ERCP by GI on POD 1 with stone removal and biliary sphincterotomy.  On POD 2, the Heather Savage was tolerating a diet, voiding well, mobilizing, and pain was controlled with oral pain medications.  The Heather Savage was stable for DC home at this time with appropriate follow up made. I discussed with the pharmacist, Heather Savage about the Heather Savage resuming breastfeeding. She recommended pumping and dumping x 2. We discussed that she is being prescribed Ultram which she states will be okay for the Heather Savage to use.   Physical Exam: Gen:  Alert, NAD, pleasant Pulm:  Normal rate and effort  Abd: Soft, ND, appropriately tender, +BS, Incisions with glue intact appears well and are without drainage, bleeding, or signs of infection Psych: A&Ox3  Skin: Heather rashes noted, warm and dry  Allergies as of 09/29/2018   Heather Known Allergies     Medication List    STOP taking these medications   cyclobenzaprine 10 MG tablet Commonly known as: FLEXERIL   famotidine 20 MG tablet Commonly known as: PEPCID   ibuprofen 600 MG tablet Commonly known as: ADVIL   oxyCODONE-acetaminophen 5-325 MG tablet Commonly known as: PERCOCET/ROXICET   senna-docusate 8.6-50 MG tablet Commonly known as: Senokot-S     TAKE these medications   Accu-Chek FastClix Lancets Misc 1 Stick by Percutaneous route 4 (four) times daily.   Accu-Chek Guide test strip Generic drug: glucose blood Use to check blood sugars four times a day  as instructed   Accu-Chek Guide w/Device Kit 1 Device by Percutaneous route 4 (four) times daily.   acetaminophen 500 MG tablet Commonly known as: TYLENOL Take 2 tablets (1,000 mg total) by mouth every 6 (six) hours.   omeprazole 40 MG capsule Commonly known as:  PRILOSEC Take 1 capsule (40 mg total) by mouth daily. What changed:   when to take this  reasons to take this   Prenate Pixie 10-0.6-0.4-200 MG Caps Take 1 tablet by mouth daily.   traMADol 50 MG tablet Commonly known as: ULTRAM Take 1 tablet (50 mg total) by mouth every 6 (six) hours as needed for severe pain.        Follow-up Information    [facility], [place]. Call in 1 day(s).   Specialty: General Surgery Why: Please call to confirm your appointment time. We are working hard to make you an appointment. Please arrive 30 minutes prior to your appointment for paperwork. Please bring a copy of your photo ID and insurance card to the appointment.  Contact information: [address] [place] [address] [phone]        Heather Brace, MD. Call in 1 day(s).   Specialty: Gastroenterology Why: To schedule a follow up appointment  Contact information: [address] [place] [address] [phone]           Signed: Alferd Savage, [facility] Surgery 09/29/2018, 8:09 AM Pager: [phone]

## 2018-09-29 NOTE — Progress Notes (Signed)
Off-going RN and Scientist, product/process development used the Language line to perform handoff with the Patient at the bedside.

## 2018-09-29 NOTE — Plan of Care (Signed)

## 2018-09-29 NOTE — Progress Notes (Signed)
RN used the Language line to perform Head to Toe assessment, assess pain, needs and give the patient medication (See MAR 2353 and 0005).

## 2018-09-29 NOTE — Progress Notes (Signed)
Off-going RN and Scientist, product/process development used the language line to perform [employer] and assess patient's pain level and needs.

## 2018-09-30 ENCOUNTER — Encounter (HOSPITAL_COMMUNITY): Payer: Self-pay | Admitting: Gastroenterology

## 2018-10-08 ENCOUNTER — Encounter: Payer: Self-pay | Admitting: Obstetrics and Gynecology

## 2018-10-08 ENCOUNTER — Ambulatory Visit (INDEPENDENT_AMBULATORY_CARE_PROVIDER_SITE_OTHER): Payer: Medicaid Other | Admitting: Obstetrics and Gynecology

## 2018-10-08 VITALS — BP 109/73 | HR 76 | Ht 63.0 in | Wt 146.0 lb

## 2018-10-08 DIAGNOSIS — Z23 Encounter for immunization: Secondary | ICD-10-CM | POA: Diagnosis not present

## 2018-10-08 DIAGNOSIS — R399 Unspecified symptoms and signs involving the genitourinary system: Secondary | ICD-10-CM | POA: Diagnosis not present

## 2018-10-08 LAB — POCT URINALYSIS DIPSTICK
Glucose, UA: NEGATIVE
Ketones, UA: NEGATIVE
Nitrite, UA: NEGATIVE
Protein, UA: NEGATIVE
Spec Grav, UA: 1.015 (ref 1.010–1.025)
pH, UA: 5 (ref 5.0–8.0)

## 2018-10-08 NOTE — Progress Notes (Signed)
Subjective:     Heather Savage is a 28 y.o. female who presents for a postpartum visit. She is 4 weeks postpartum following a spontaneous vaginal delivery. I have fully reviewed the prenatal and intrapartum course. The delivery was at 39 gestational weeks due to cholestasis of pregnancy. Outcome: spontaneous vaginal delivery. Anesthesia: IV sedation. Postpartum course has been complicated by lsc cholecystectomy on 09/27/18. Baby's course has been uncomplicated. Baby is feeding by both breast and bottle - Gerber. Bleeding no bleeding. Bowel function is abnormal . Bladder function is abnormal: dysuria. Patient is not sexually active. Contraception method is abstinence. Postpartum depression screening: negative.     Review of Systems Pertinent items are noted in HPI.   Objective:    BP 109/73   Pulse 76   Ht 5\' 3"  (1.6 m)   Wt 146 lb (66.2 kg)   LMP 12/22/2017 (Exact Date)   BMI 25.86 kg/m   General:  alert, cooperative and no distress   Breasts:  inspection negative, no nipple discharge or bleeding, no masses or nodularity palpable  Lungs: clear to auscultation bilaterally  Heart:  regular rate and rhythm  Abdomen: soft, non-tender; bowel sounds normal; no masses,  no organomegaly and incision sites healing well without erythema, induration or drainage   Vulva:  normal  Vagina: normal vagina, no discharge, exudate, lesion, or erythema  Cervix:  multiparous appearance  Corpus: normal size, contour, position, consistency, mobility, non-tender  Adnexa:  normal adnexa and no mass, fullness, tenderness  Rectal Exam: Not performed.        Assessment:     Normal postpartum exam. Pap smear not done at today's visit.   Plan:    1. Contraception: none 2. Patient is medically cleared to resume all activities of daily living. Urine culture collected 3. Follow up in: 4 weeks for glucola given GDM, in February for annual exam or as needed.

## 2018-10-09 ENCOUNTER — Other Ambulatory Visit (HOSPITAL_COMMUNITY)
Admission: RE | Admit: 2018-10-09 | Discharge: 2018-10-09 | Disposition: A | Discharge status: Home / Self Care | Payer: Medicaid Other | Source: Ambulatory Visit | Attending: Surgery | Admitting: Surgery

## 2018-10-09 DIAGNOSIS — Z20828 Contact with and (suspected) exposure to other viral communicable diseases: Secondary | ICD-10-CM | POA: Diagnosis not present

## 2018-10-09 DIAGNOSIS — Z01812 Encounter for preprocedural laboratory examination: Secondary | ICD-10-CM | POA: Insufficient documentation

## 2018-10-09 IMAGING — US US OB COMP LESS 14 WK
1 series · 14 of 28 positions shown · non-contrast
Comparison: None.

CLINICAL DATA: Lower abdominal pain for 2 weeks. Patient is 8 weeks
and 6 days pregnant based on her last menstrual period. Quantitative
beta HCG level is [DATE].

EXAM:
OBSTETRIC <14 WK US AND TRANSVAGINAL OB US
TECHNIQUE: Both transabdominal and transvaginal ultrasound examinations were
performed for complete evaluation of the gestation as well as the
maternal uterus, adnexal regions, and pelvic cul-de-sac.
Transvaginal technique was performed to assess early pregnancy.

[Series 1: us ob comp less 14 wk · 0.22mm/px · 42 acquisitions, 14 frames shown]
[im 2/42]
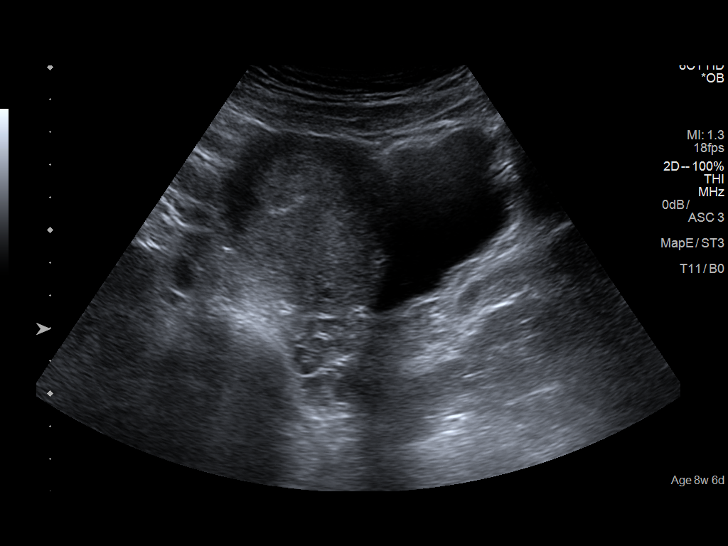
[im 5/42]
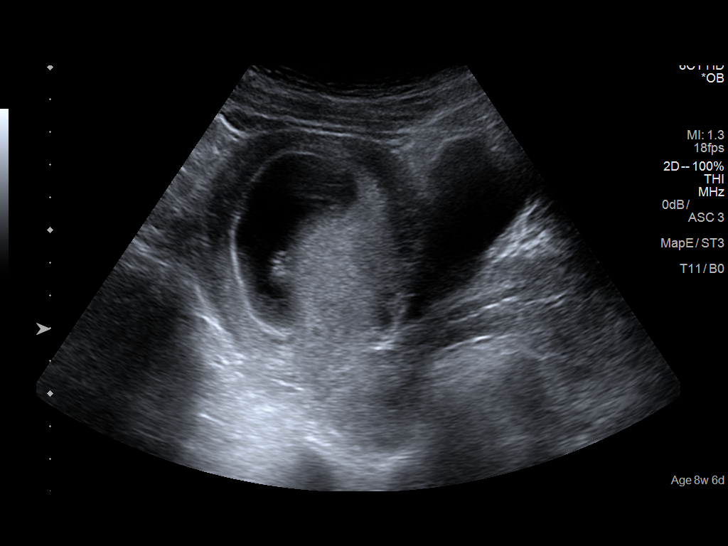
[im 8/42]
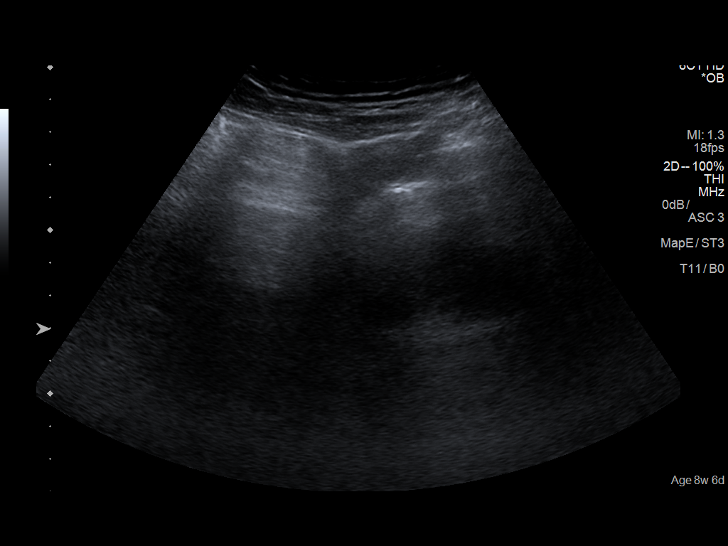
[im 11/42]
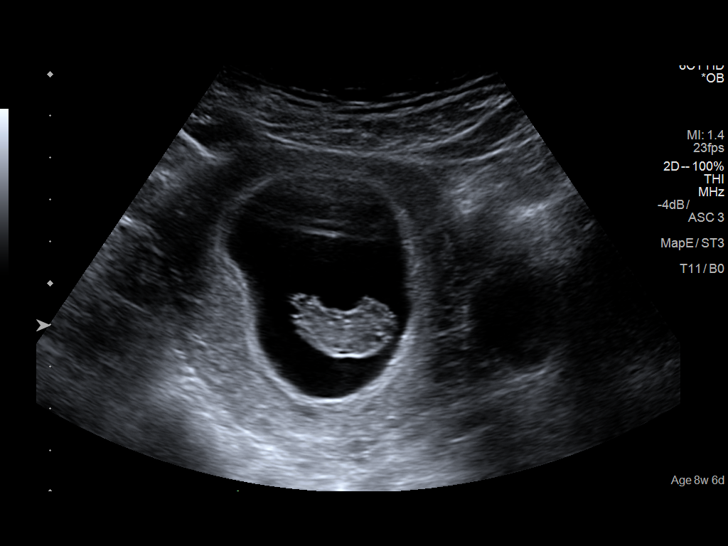
[im 14/42]
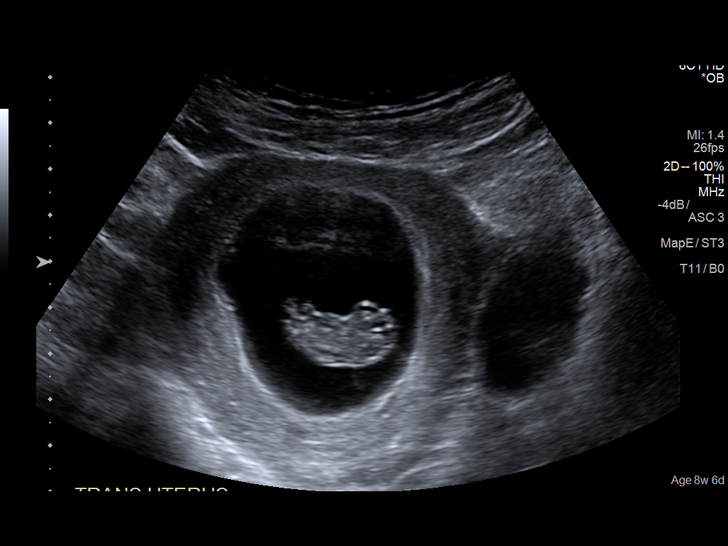
[im 17/42]
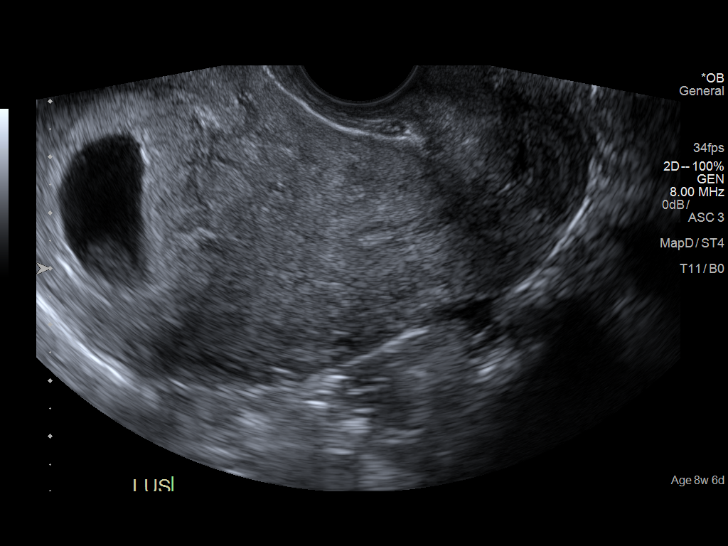
[im 20/42]
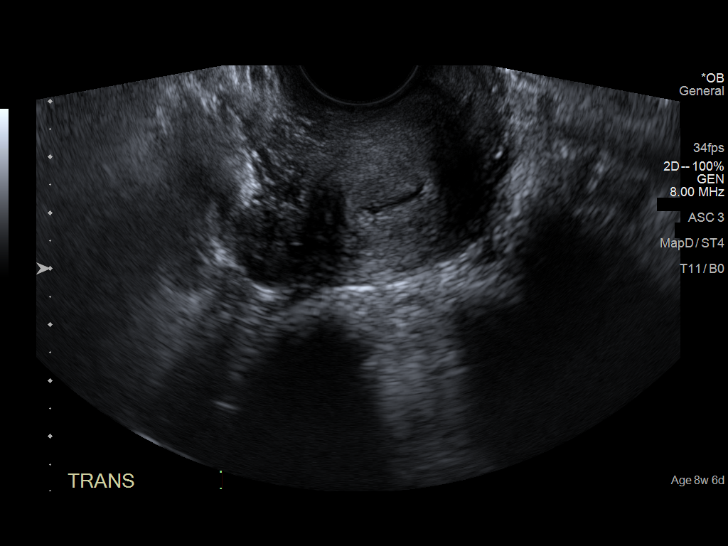
[im 23/42]
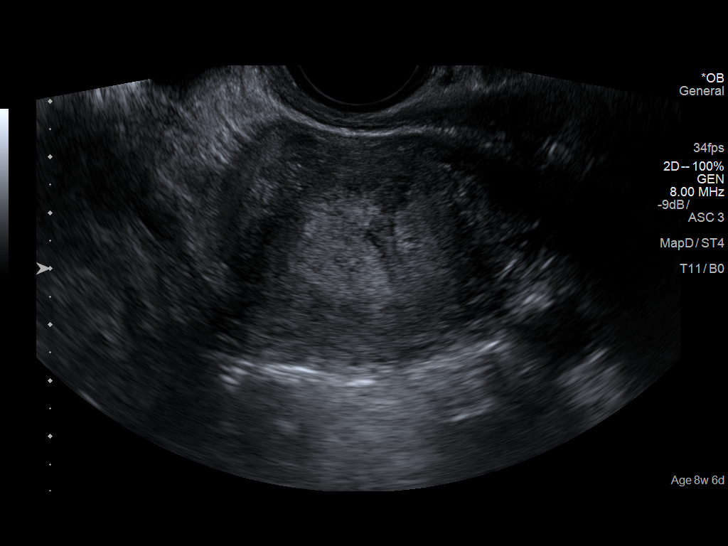
[im 26/42]
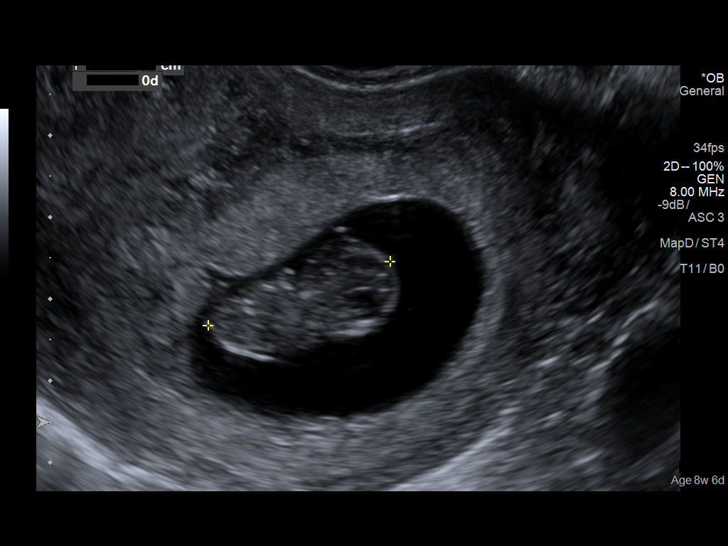
[im 29/42]
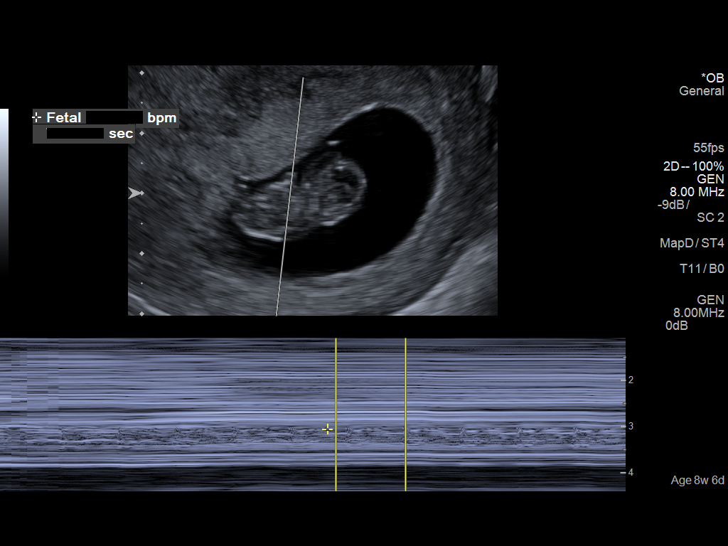
[im 32/42]
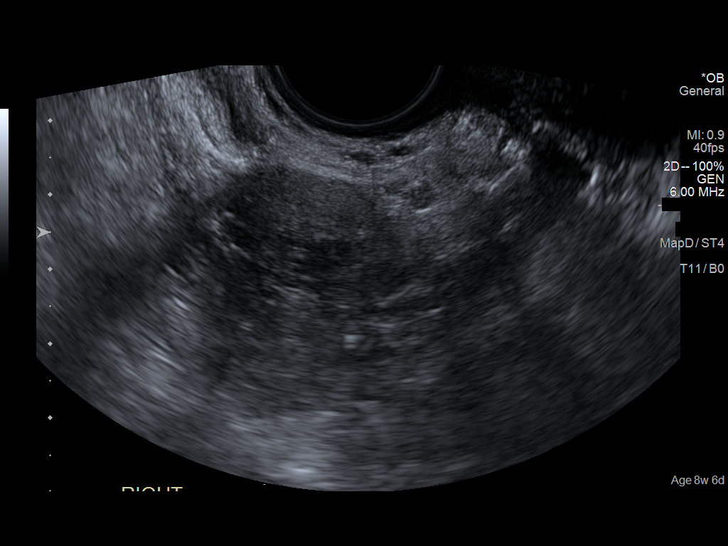
[im 35/42]
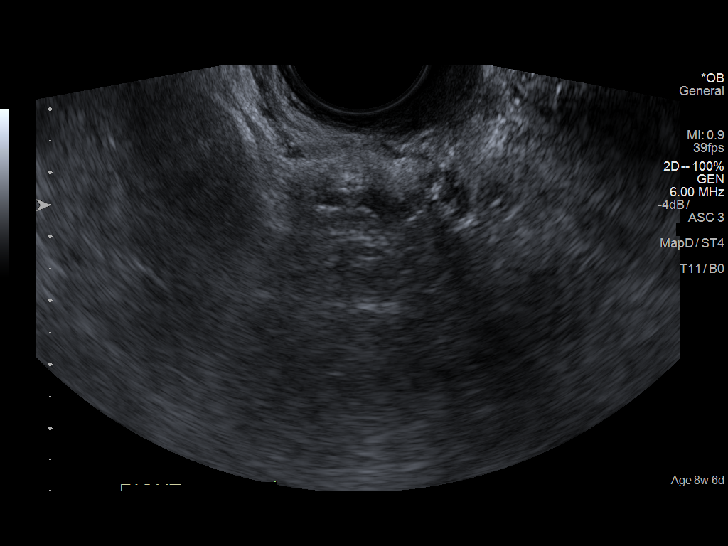
[im 38/42]
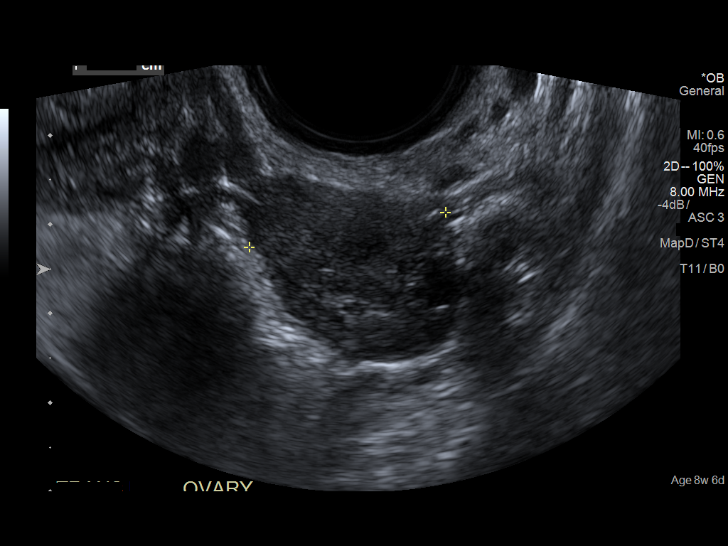
[im 42/42]
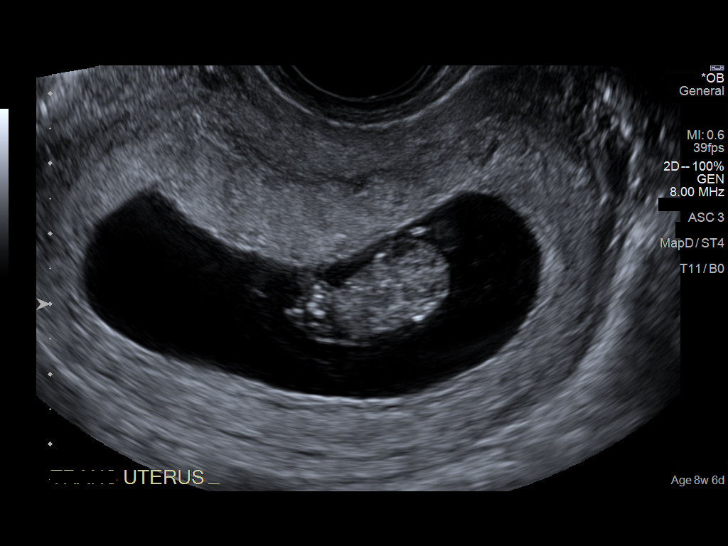

[14 of 28 positions shown; findings below may reference images not displayed]

FINDINGS: Intrauterine gestational sac: Single

Yolk sac:  Visualized.

Embryo:  Visualized.

Cardiac Activity: Visualized.

Heart Rate: 147  bpm

CRL:  23.7  mm   9 w   0 d                  US EDC: 07/13/2016

Subchorionic hemorrhage:  None visualized.

Maternal uterus/adnexae: No uterine masses. Cervix is closed. No
adnexal masses. Ovaries are unremarkable. No pelvic free fluid.
IMPRESSION: 1. Single live intrauterine pregnancy with a measured gestational
age of 9 weeks, concordant with the estimated gestational stage
based on the last menstrual period.
2. No pregnancy complication.  No maternal abnormalities.

## 2018-10-10 ENCOUNTER — Encounter: Payer: Self-pay | Admitting: Surgery

## 2018-10-10 LAB — URINE CULTURE

## 2018-10-10 LAB — NOVEL CORONAVIRUS, NAA (HOSP ORDER, SEND-OUT TO REF LAB; TAT 18-24 HRS): SARS-CoV-2, NAA: NOT DETECTED

## 2018-10-10 MED ORDER — ACETAMINOPHEN 10 MG/ML IV SOLN: 1000.0000 mg | Freq: Once | INTRAVENOUS | Status: AC | PRN

## 2018-10-10 MED ORDER — ACETAMINOPHEN 160 MG/5ML PO SOLN: 325.0000 mg | Freq: Once | ORAL | Status: DC | PRN

## 2018-10-10 MED ORDER — ACETAMINOPHEN 325 MG PO TABS: 325.0000 mg | ORAL_TABLET | Freq: Once | ORAL | Status: DC | PRN

## 2018-10-10 MED ORDER — LACTATED RINGERS IV SOLN: INTRAVENOUS | Status: DC

## 2018-10-10 MED ORDER — PROMETHAZINE HCL 25 MG/ML IJ SOLN: 6.2500 mg | INTRAMUSCULAR | Status: DC | PRN

## 2018-10-10 MED ORDER — FENTANYL CITRATE (PF) 100 MCG/2ML IJ SOLN: 25.0000 ug | INTRAMUSCULAR | Status: DC | PRN

## 2018-10-10 MED ORDER — MEPERIDINE HCL 25 MG/ML IJ SOLN: 6.2500 mg | INTRAMUSCULAR | Status: DC | PRN

## 2018-10-10 NOTE — Progress Notes (Signed)
I called [facility] and spoke with Abigail Butts, I asked that the office calls OR scheduler and cancel OR. I called in house interpreter and asked to have Interpreter cancelled for Monday.

## 2018-10-13 ENCOUNTER — Ambulatory Visit (HOSPITAL_COMMUNITY): Admission: RE | Admit: 2018-10-13 | Payer: Medicaid Other | Source: Home / Self Care | Admitting: Surgery

## 2018-10-13 SURGERY — LAPAROSCOPIC CHOLECYSTECTOMY
Anesthesia: General

## 2018-10-14 ENCOUNTER — Other Ambulatory Visit: Payer: Medicaid Other

## 2018-10-22 ENCOUNTER — Other Ambulatory Visit: Payer: Medicaid Other

## 2018-10-22 ENCOUNTER — Other Ambulatory Visit: Payer: Self-pay

## 2018-10-22 DIAGNOSIS — Z8632 Personal history of gestational diabetes: Secondary | ICD-10-CM

## 2018-10-23 LAB — GLUCOSE TOLERANCE, 2 HOURS
Glucose, 2 hour: 128 mg/dL (ref 65–139)
Glucose, GTT - Fasting: 92 mg/dL (ref 65–99)

## 2018-10-25 ENCOUNTER — Encounter: Payer: Self-pay | Admitting: Obstetrics & Gynecology

## 2019-02-26 DIAGNOSIS — M2630 Unspecified anomaly of tooth position of fully erupted tooth or teeth: Secondary | ICD-10-CM | POA: Diagnosis not present

## 2019-09-08 ENCOUNTER — Other Ambulatory Visit: Payer: Self-pay

## 2019-09-08 ENCOUNTER — Encounter: Payer: Self-pay | Admitting: Advanced Practice Midwife

## 2019-09-08 ENCOUNTER — Ambulatory Visit: Payer: Medicaid Other | Admitting: Advanced Practice Midwife

## 2019-09-08 VITALS — BP 130/83 | HR 81 | Wt 171.4 lb

## 2019-09-08 DIAGNOSIS — R822 Biliuria: Secondary | ICD-10-CM

## 2019-09-08 DIAGNOSIS — N939 Abnormal uterine and vaginal bleeding, unspecified: Secondary | ICD-10-CM | POA: Diagnosis not present

## 2019-09-08 DIAGNOSIS — R609 Edema, unspecified: Secondary | ICD-10-CM | POA: Diagnosis not present

## 2019-09-08 LAB — POCT URINALYSIS DIPSTICK
Bilirubin, UA: POSITIVE
Glucose, UA: NEGATIVE
Ketones, UA: NEGATIVE
Leukocytes, UA: NEGATIVE
Nitrite, UA: NEGATIVE
Protein, UA: NEGATIVE
Spec Grav, UA: 1.015 (ref 1.010–1.025)
Urobilinogen, UA: 0.2 E.U./dL
pH, UA: 6.5 (ref 5.0–8.0)

## 2019-09-08 LAB — POCT URINE PREGNANCY: Preg Test, Ur: NEGATIVE

## 2019-09-08 MED ORDER — MEDROXYPROGESTERONE ACETATE 10 MG PO TABS: 10.0000 mg | ORAL_TABLET | Freq: Every day | ORAL | 1 refills | Status: DC

## 2019-09-08 NOTE — Progress Notes (Signed)
Pt is here today for AUB. Pt reports her period has lasted for a month now. Pt reports the bleeding has been spotting and not heavy. Pt also reports swelling in her feet.

## 2019-09-08 NOTE — Patient Instructions (Signed)
Abnormal Uterine Bleeding °Abnormal uterine bleeding means bleeding more than usual from your uterus. It can include: °· Bleeding between periods. °· Bleeding after sex. °· Bleeding that is heavier than normal. °· Periods that last longer than usual. °· Bleeding after you have stopped having your period (menopause). °There are many problems that may cause this. You should see a doctor for any kind of bleeding that is not normal. Treatment depends on the cause of the bleeding. °Follow these instructions at home: °· Watch your condition for any changes. °· Do not use tampons, douche, or have sex, if your doctor tells you not to. °· Change your pads often. °· Get regular well-woman exams. Make sure they include a pelvic exam and cervical cancer screening. °· Keep all follow-up visits as told by your doctor. This is important. °Contact a doctor if: °· The bleeding lasts more than one week. °· You feel dizzy at times. °· You feel like you are going to throw up (nauseous). °· You throw up. °Get help right away if: °· You pass out. °· You have to change pads every hour. °· You have belly (abdominal) pain. °· You have a fever. °· You get sweaty. °· You get weak. °· You passing large blood clots from your vagina. °Summary °· Abnormal uterine bleeding means bleeding more than usual from your uterus. °· There are many problems that may cause this. You should see a doctor for any kind of bleeding that is not normal. °· Treatment depends on the cause of the bleeding. °This information is not intended to replace advice given to you by your health care provider. Make sure you discuss any questions you have with your health care provider. °Document Revised: 01/10/2016 Document Reviewed: 01/10/2016 °Elsevier Patient Education © 2020 [employer] Inc[employer] ° °

## 2019-09-08 NOTE — Progress Notes (Signed)
  GYNECOLOGY PROGRESS NOTE  History:  29 y.o. [id number] presents to [place] office today for problem gyn visit. She reports irregular menses since childbirth 1 year ago but menses started 1 month ago and has not stopped.  It is associated with cramping pain, heavy bleeding with clots at times, spotting only at other times.  She also has swelling in her feet/ankles that started with the bleeding.  There are pigmentation changes in both legs that started with her pregnancy over 1 year ago but have worsened in the last month.  She denies any cardiac or pulmonary health history. She is breastfeeding her 29 year old.  She denies h/a, dizziness, shortness of breath, n/v, or fever/chills.    The following portions of the patient's history were reviewed and updated as appropriate: allergies, current medications, past family history, past medical history, past social history, past surgical history and problem list. Last pap smear on 03/19/18 was normal.  Review of Systems:  Pertinent items are noted in HPI.   Objective:  Physical Exam Blood pressure 130/83, pulse 81, weight 171 lb 6.4 oz (77.7 kg), last menstrual period 08/08/2019, not currently breastfeeding. VS reviewed, nursing note reviewed,  Constitutional: well developed, well nourished, no distress HEENT: normocephalic CV: normal rate Pulm/chest wall: normal effort Breast Exam: deferred Abdomen: soft Neuro: alert and oriented x 3 Skin: warm, dry Psych: affect normal Pelvic exam:  Bimanual exam: Cervix 0/long/high, firm, anterior, neg CMT, uterus mildly tender, nonenlarged, adnexa without tenderness, enlargement, or mass  Assessment & Plan:   1. Vaginal bleeding --UPT negative - POCT urine pregnancy - POCT Urinalysis Dipstick  2. Dependent edema --+1 nonpitting BLE, pt reports worsening with sitting/standing --Elevate, increase PO fluids, decrease sodium --See tx for AUB, which may improve swelling that is associated with  hormones --Pt to follow up with PCP if swelling does not improve  - CBC - Comp Met (CMET)  3. Bilirubin in urine  - CBC - Comp Met (CMET)  4. Abnormal uterine bleeding (AUB) --Likely anovulatory cycles, may be related to breastfeeding --Discussed options with pt including OCPs or progesterone only options.  --Pt prefers not to decrease breastmilk supply so opts for Provera x 10 days --Pt to start medication today and expect bleeding to slow/stop during the treatment.  Then, after 10 days she should have vaginal bleeding. If bleeding is abnormal again (too heavy or too long) she can take another 10 days of Provera and a refill is written on the Rx. --F/U with MD in 3 months --F/U with PCP for edema/skin pigmentation changes if not improved with improvement of AUB - medroxyPROGESTERone (PROVERA) 10 MG tablet; Take 1 tablet (10 mg total) by mouth daily.  Dispense: 10 tablet; Refill: 1  Fatima Blank, CNM 4:09 PM

## 2019-09-09 ENCOUNTER — Other Ambulatory Visit (HOSPITAL_COMMUNITY)
Admission: RE | Admit: 2019-09-09 | Discharge: 2019-09-09 | Disposition: A | Discharge status: Home / Self Care | Payer: Medicaid Other | Source: Ambulatory Visit | Attending: Advanced Practice Midwife | Admitting: Advanced Practice Midwife

## 2019-09-09 DIAGNOSIS — N939 Abnormal uterine and vaginal bleeding, unspecified: Secondary | ICD-10-CM | POA: Diagnosis not present

## 2019-09-09 LAB — COMPREHENSIVE METABOLIC PANEL
ALT: 62 IU/L — ABNORMAL HIGH (ref 0–32)
AST: 41 IU/L — ABNORMAL HIGH (ref 0–40)
Albumin/Globulin Ratio: 1.5 (ref 1.2–2.2)
Albumin: 4.4 g/dL (ref 3.9–5.0)
Alkaline Phosphatase: 107 IU/L (ref 48–121)
BUN/Creatinine Ratio: 21 (ref 9–23)
BUN: 12 mg/dL (ref 6–20)
Bilirubin Total: 0.2 mg/dL (ref 0.0–1.2)
CO2: 21 mmol/L (ref 20–29)
Calcium: 9.4 mg/dL (ref 8.7–10.2)
Chloride: 100 mmol/L (ref 96–106)
Creatinine, Ser: 0.57 mg/dL (ref 0.57–1.00)
GFR calc Af Amer: 145 mL/min/{1.73_m2} (ref >59)
GFR calc non Af Amer: 126 mL/min/{1.73_m2} (ref >59)
Globulin, Total: 2.9 g/dL (ref 1.5–4.5)
Glucose: 161 mg/dL — ABNORMAL HIGH (ref 65–99)
Potassium: 3.7 mmol/L (ref 3.5–5.2)
Sodium: 137 mmol/L (ref 134–144)
Total Protein: 7.3 g/dL (ref 6.0–8.5)

## 2019-09-09 LAB — CBC
Hematocrit: 37.4 % (ref 34.0–46.6)
Hemoglobin: 12.8 g/dL (ref 11.1–15.9)
MCH: 30.8 pg (ref 26.6–33.0)
MCHC: 34.2 g/dL (ref 31.5–35.7)
MCV: 90 fL (ref 79–97)
Platelets: 288 10*3/uL (ref 150–450)
RBC: 4.16 x10E6/uL (ref 3.77–5.28)
RDW: 13 % (ref 11.7–15.4)
WBC: 5.6 10*3/uL (ref 3.4–10.8)

## 2019-09-09 NOTE — Addendum Note (Signed)
Addended by: Eldred Manges on: 09/09/2019 08:54 AM   Modules accepted: Orders

## 2019-09-10 LAB — CERVICOVAGINAL ANCILLARY ONLY
Bacterial Vaginitis (gardnerella): NEGATIVE
Candida Glabrata: NEGATIVE
Candida Vaginitis: NEGATIVE
Chlamydia: NEGATIVE
Comment: NEGATIVE
Comment: NEGATIVE
Comment: NEGATIVE
Comment: NEGATIVE
Comment: NEGATIVE
Comment: NORMAL
Neisseria Gonorrhea: NEGATIVE
Trichomonas: NEGATIVE

## 2019-09-11 ENCOUNTER — Telehealth: Payer: Self-pay

## 2019-09-11 NOTE — Telephone Encounter (Signed)
Called pt via interpreter to advise of results and provider recommendations, no answer, left vm

## 2019-09-17 ENCOUNTER — Telehealth: Payer: Self-pay

## 2019-09-17 NOTE — Telephone Encounter (Signed)
S/w pt and pt's BF about results and referral to PCP, they stated that they have already found a PCP and will follow up there.

## 2019-10-04 ENCOUNTER — Other Ambulatory Visit: Payer: Self-pay | Admitting: Advanced Practice Midwife

## 2019-10-04 DIAGNOSIS — N939 Abnormal uterine and vaginal bleeding, unspecified: Secondary | ICD-10-CM

## 2019-10-04 MED ORDER — MEDROXYPROGESTERONE ACETATE 10 MG PO TABS: 10.0000 mg | ORAL_TABLET | Freq: Every day | ORAL | 5 refills | Status: DC

## 2020-02-23 ENCOUNTER — Other Ambulatory Visit: Payer: Medicaid Other

## 2020-02-23 ENCOUNTER — Other Ambulatory Visit: Payer: Self-pay

## 2020-02-23 DIAGNOSIS — Z20822 Contact with and (suspected) exposure to covid-19: Secondary | ICD-10-CM

## 2020-02-25 LAB — SARS-COV-2, NAA 2 DAY TAT

## 2020-02-25 LAB — NOVEL CORONAVIRUS, NAA: SARS-CoV-2, NAA: NOT DETECTED

## 2020-05-09 ENCOUNTER — Other Ambulatory Visit: Payer: Self-pay

## 2020-05-09 ENCOUNTER — Ambulatory Visit (HOSPITAL_COMMUNITY)
Admission: EM | Admit: 2020-05-09 | Discharge: 2020-05-09 | Disposition: A | Discharge status: Home / Self Care | Payer: Medicaid Other | Attending: Student | Admitting: Student

## 2020-05-09 ENCOUNTER — Encounter (HOSPITAL_COMMUNITY): Payer: Self-pay

## 2020-05-09 DIAGNOSIS — Z789 Other specified health status: Secondary | ICD-10-CM | POA: Diagnosis not present

## 2020-05-09 DIAGNOSIS — Z9049 Acquired absence of other specified parts of digestive tract: Secondary | ICD-10-CM | POA: Diagnosis not present

## 2020-05-09 DIAGNOSIS — K219 Gastro-esophageal reflux disease without esophagitis: Secondary | ICD-10-CM | POA: Diagnosis not present

## 2020-05-09 DIAGNOSIS — L309 Dermatitis, unspecified: Secondary | ICD-10-CM

## 2020-05-09 LAB — POC URINE PREG, ED: Preg Test, Ur: NEGATIVE

## 2020-05-09 LAB — POCT URINALYSIS DIPSTICK, ED / UC
Bilirubin Urine: NEGATIVE
Glucose, UA: NEGATIVE mg/dL
Ketones, ur: NEGATIVE mg/dL
Leukocytes,Ua: NEGATIVE
Nitrite: NEGATIVE
Protein, ur: NEGATIVE mg/dL
Specific Gravity, Urine: 1.02 (ref 1.005–1.030)
Urobilinogen, UA: 0.2 mg/dL (ref 0.0–1.0)
pH: 7 (ref 5.0–8.0)

## 2020-05-09 MED ORDER — TRIAMCINOLONE ACETONIDE 0.5 % EX OINT: 1.0000 "application " | TOPICAL_OINTMENT | Freq: Two times a day (BID) | CUTANEOUS | 0 refills | Status: AC

## 2020-05-09 MED ORDER — ALUM & MAG HYDROXIDE-SIMETH 200-200-20 MG/5ML PO SUSP
30.0000 mL | Freq: Once | ORAL | Status: AC
  Administered 2020-05-09: 30 mL via ORAL

## 2020-05-09 MED ORDER — OMEPRAZOLE 20 MG PO CPDR: 20.0000 mg | DELAYED_RELEASE_CAPSULE | Freq: Every day | ORAL | 2 refills | Status: DC

## 2020-05-09 MED ORDER — LIDOCAINE VISCOUS HCL 2 % MT SOLN
OROMUCOSAL | Status: AC
  Filled 2020-05-09: qty 15

## 2020-05-09 MED ORDER — ALUM & MAG HYDROXIDE-SIMETH 200-200-20 MG/5ML PO SUSP
ORAL | Status: AC
  Filled 2020-05-09: qty 30

## 2020-05-09 MED ORDER — LIDOCAINE VISCOUS HCL 2 % MT SOLN
15.0000 mL | Freq: Once | OROMUCOSAL | Status: AC
  Administered 2020-05-09: 15 mL via ORAL

## 2020-05-09 NOTE — ED Provider Notes (Signed)
[facility]    CSN: [id number] Arrival date & time: 05/09/20  1732      History   Chief Complaint Chief Complaint  Patient presents with  . Heartburn  . Back Pain    HPI Heather Savage is a 30 y.o. female presenting with reflux.  Medical history gastritis, gestational diabetes, malaria, anemia.   She is status post cholecystectomy.  She has been treated for GERD in the past with Prilosec, but states she has been off of this for years.  Endorses 3 months of epigastric pain, worse with eating.  Endorses few episodes of this a week with 1 episode of vomiting per week.  States this vomit is bilious but nonbloody.  Pain sometimes will radiate to her central back.  Has not tried any medications for this.  Denies changes in bowel or bladder function, last bowel movement was 1 day ago and was normal, passing gas regularly.  Denies urinary symptoms.  She is not pregnant or breast-feeding. Denies chest pain, dizziness, shortness of breath, weakness. Also with itchy dark rash in her armpits for 1 week.  Has not tried anything for the symptoms.  Denies fever/chills.  HPI  Past Medical History:  Diagnosis Date  . Anemia    states has low blood count, no current meds.  Marland Kitchen Hearing loss    right  . History of gastritis    no current med.  Marland Kitchen History of gestational diabetes 07/16/2018   Normal postpartum GTT  . History of malaria 2012  . Preterm labor   . Rh negative, maternal   . Tympanic membrane perforation 05/2012   right    Patient Active Problem List   Diagnosis Date Noted  . Abnormal uterine bleeding (AUB) 09/08/2019  . Elevated transaminase level   . Right upper quadrant abdominal pain   . Abnormal cholangiogram   . S/P laparoscopic cholecystectomy 09/27/2018  . Language barrier 01/02/2016  . Hearing loss 05/01/2011    Past Surgical History:  Procedure Laterality Date  . CHOLECYSTECTOMY N/A 09/27/2018   Procedure: LAPAROSCOPIC CHOLECYSTECTOMY WITH  INTRAOPERATIVE CHOLANGIOGRAM;  Surgeon: Ileana Roup, MD;  Location: [facility];  Service: General;  Laterality: N/A;  . ERCP N/A 09/28/2018   Procedure: ENDOSCOPIC RETROGRADE CHOLANGIOPANCREATOGRAPHY (ERCP);  Surgeon: Ladene Artist, MD;  Location: [facility];  Service: Endoscopy;  Laterality: N/A;  . EXTERNAL EAR SURGERY    . REMOVAL OF STONES  09/28/2018   Procedure: REMOVAL OF STONES;  Surgeon: Ladene Artist, MD;  Location: [facility];  Service: Endoscopy;;  . SCAR REVISION  08/27/2011   Procedure: SCAR REVISION;  Surgeon: Cristine Polio, MD;  Location: [place];  Service: Plastics;  Laterality: N/A;  scar revision of forehead  . SPHINCTEROTOMY  09/28/2018   Procedure: SPHINCTEROTOMY;  Surgeon: Ladene Artist, MD;  Location: [facility];  Service: Endoscopy;;  . TYMPANOPLASTY  05/14/2011   Procedure: TYMPANOPLASTY;  Surgeon: Izora Gala, MD;  Location: [facility];  Service: ENT;  Laterality: Left;  TYMPANOPLASYT AND  POSSIBLE OSSICULARPLASTY   . TYMPANOPLASTY  10/10/2011   Procedure: TYMPANOPLASTY;  Surgeon: Izora Gala, MD;  Location: [facility];  Service: ENT;  Laterality: Right;  . TYMPANOPLASTY Right 06/30/2012   Procedure: REVISION RIGHT TYMPANOPLASTY;  Surgeon: Izora Gala, MD;  Location: [place];  Service: ENT;  Laterality: Right;    OB History    Gravida  3   Para  2   Term  2  Preterm  0   AB  1   Living  2     SAB  1   IAB  0   Ectopic  0   Multiple  0   Live Births  2            Home Medications    Prior to Admission medications   Medication Sig Start Date End Date Taking? Authorizing Provider  omeprazole (PRILOSEC) 20 MG capsule Take 1 capsule (20 mg total) by mouth daily. 05/09/20  Yes Hazel Sams, PA-C  triamcinolone ointment (KENALOG) 0.5 % Apply 1 application topically 2 (two) times daily for 7 days. 05/09/20 05/16/20 Yes Hazel Sams, PA-C  Accu-Chek FastClix Lancets MISC 1 Stick by  Percutaneous route 4 (four) times daily. Patient not taking: Reported on 10/08/2018 07/17/18   Sloan Leiter, MD  acetaminophen (TYLENOL) 500 MG tablet Take 2 tablets (1,000 mg total) by mouth every 6 (six) hours. Patient not taking: Reported on 09/08/2019 09/29/18   Maczis, Barth Kirks, PA-C  Blood Glucose Monitoring Suppl (ACCU-CHEK GUIDE) w/Device KIT 1 Device by Percutaneous route 4 (four) times daily. Patient not taking: Reported on 10/08/2018 07/17/18   Sloan Leiter, MD  glucose blood (ACCU-CHEK GUIDE) test strip Use to check blood sugars four times a day as instructed Patient not taking: Reported on 10/08/2018 07/17/18   Sloan Leiter, MD  medroxyPROGESTERone (PROVERA) 10 MG tablet Take 1 tablet (10 mg total) by mouth daily. 10/04/19   Leftwich-Kirby, Kathie Dike, CNM  Prenat-FeAsp-Meth-FA-DHA w/o A (PRENATE PIXIE) 10-0.6-0.4-200 MG CAPS Take 1 tablet by mouth daily. Patient not taking: Reported on 09/08/2019 06/11/18   Shelly Bombard, MD  traMADol (ULTRAM) 50 MG tablet Take 1 tablet (50 mg total) by mouth every 6 (six) hours as needed for severe pain. Patient not taking: Reported on 10/08/2018 09/29/18   Maczis, Barth Kirks, PA-C    Family History History reviewed. No pertinent family history.  Social History Social History   Tobacco Use  . Smoking status: Never Smoker  . Smokeless tobacco: Never Used  Vaping Use  . Vaping Use: Never used  Substance Use Topics  . Alcohol use: Never  . Drug use: Never     Allergies   Other and Skin adhesives [cyanoacrylate]   Review of Systems Review of Systems  Constitutional: Negative for appetite change, chills, diaphoresis, fever and unexpected weight change.  HENT: Negative for congestion, ear pain, sinus pressure, sinus pain, sneezing, sore throat and trouble swallowing.   Respiratory: Negative for cough, chest tightness and shortness of breath.   Cardiovascular: Negative for chest pain.  Gastrointestinal: Positive for abdominal pain and vomiting.  Negative for abdominal distention, anal bleeding, blood in stool, constipation, diarrhea, nausea and rectal pain.  Genitourinary: Negative for dysuria, flank pain, frequency and urgency.  Musculoskeletal: Negative for back pain and myalgias.  Skin: Positive for rash.  Neurological: Negative for dizziness, light-headedness and headaches.  All other systems reviewed and are negative.    Physical Exam Triage Vital Signs ED Triage Vitals  Enc Vitals Group     BP      Pulse      Resp      Temp      Temp src      SpO2      Weight      Height      Head Circumference      Peak Flow      Pain Score  Pain Loc      Pain Edu?      Excl. in [place]?    No data found.  Updated Vital Signs BP 119/78   Pulse 66   Temp 98.5 F (36.9 C) (Oral)   Resp 18   LMP 04/30/2020 (Exact Date)   SpO2 98%   Breastfeeding No   Visual Acuity Right Eye Distance:   Left Eye Distance:   Bilateral Distance:    Right Eye Near:   Left Eye Near:    Bilateral Near:     Physical Exam Vitals reviewed.  Constitutional:      General: She is not in acute distress.    Appearance: Normal appearance. She is not ill-appearing.  HENT:     Head: Normocephalic and atraumatic.     Mouth/Throat:     Mouth: Mucous membranes are moist.     Comments: Moist mucous membranes Eyes:     Extraocular Movements: Extraocular movements intact.     Pupils: Pupils are equal, round, and reactive to light.  Cardiovascular:     Rate and Rhythm: Normal rate and regular rhythm.     Heart sounds: Normal heart sounds.  Pulmonary:     Effort: Pulmonary effort is normal.     Breath sounds: Normal breath sounds. No wheezing, rhonchi or rales.  Abdominal:     General: Bowel sounds are normal. There is no distension.     Palpations: Abdomen is soft. There is no mass.     Tenderness: There is abdominal tenderness in the epigastric area. There is no right CVA tenderness, left CVA tenderness, guarding or rebound. Negative signs  include Murphy's sign, Rovsing's sign and McBurney's sign.  Skin:    General: Skin is warm.     Capillary Refill: Capillary refill takes less than 2 seconds.     Comments: Good skin turgor Armpits with hyperpigmented urticarial rash bilaterally   Neurological:     General: No focal deficit present.     Mental Status: She is alert and oriented to person, place, and time.  Psychiatric:        Mood and Affect: Mood normal.        Behavior: Behavior normal.      UC Treatments / Results  Labs (all labs ordered are listed, but only abnormal results are displayed) Labs Reviewed  POCT URINALYSIS DIPSTICK, ED / UC - Abnormal; Notable for the following components:      Result Value   Hgb urine dipstick TRACE (*)    All other components within normal limits  POC URINE PREG, ED    EKG   Radiology No results found.  Procedures Procedures (including critical care time)  Medications Ordered in UC Medications  alum & mag hydroxide-simeth (MAALOX/MYLANTA) 200-200-20 MG/5ML suspension 30 mL (30 mLs Oral Given 05/09/20 1847)    And  lidocaine (XYLOCAINE) 2 % viscous mouth solution 15 mL (15 mLs Oral Given 05/09/20 1847)    Initial Impression / Assessment and Plan / UC Course  I have reviewed the triage vital signs and the nursing notes.  Pertinent labs & imaging results that were available during my care of the patient were reviewed by me and considered in my medical decision making (see chart for details).     This patient is a 30 year old female presenting with GERD and armpit inflammation. Today this pt is afebrile nontachycardic nontachypneic, oxygenating well on room air, no wheezes rhonchi or rales.  Epigastric pain but otherwise benign exam.  UA today  with trace blood but otherwise within normal limits, urine pregnancy negative.  GI cocktail administered with improvement in GI symptoms. History cholecystectomy.   Prilosec as below. Triamcinolone cream for armpit dermatitis. Rec  changing products/deodorant.  ED return precautions discussed.  Final Clinical Impressions(s) / UC Diagnoses   Final diagnoses:  Gastroesophageal reflux disease without esophagitis  Dermatitis  Language barrier  History of cholecystectomy     Discharge Instructions     -Start the omeprazole (Prilosec), 1 pill daily.  I sent 3 months worth. -Also use the triamcinolone ointment in your armpits twice daily for 7 days.  You might want to consider switching your deodorant as well. -Seek additional medical attention if symptoms worsen or persist.    ED Prescriptions    Medication Sig Dispense Auth. Provider   omeprazole (PRILOSEC) 20 MG capsule Take 1 capsule (20 mg total) by mouth daily. 30 capsule Marin Roberts E, PA-C   triamcinolone ointment (KENALOG) 0.5 % Apply 1 application topically 2 (two) times daily for 7 days. 14 g Hazel Sams, PA-C     PDMP not reviewed this encounter.   Hazel Sams, PA-C 05/09/20 1913

## 2020-05-09 NOTE — ED Triage Notes (Signed)
Pt in with c/o heartburn, abdominal bloating and back pain that has been going on for 2 months but worsening over the past week  Pt has not had medication for sxs

## 2020-05-09 NOTE — Discharge Instructions (Addendum)
-  Start the omeprazole (Prilosec), 1 pill daily.  I sent 3 months worth. -Also use the triamcinolone ointment in your armpits twice daily for 7 days.  You might want to consider switching your deodorant as well. -Seek additional medical attention if symptoms worsen or persist.

## 2020-05-18 ENCOUNTER — Ambulatory Visit: Payer: Medicaid Other | Admitting: Family Medicine

## 2020-12-14 ENCOUNTER — Ambulatory Visit (HOSPITAL_COMMUNITY)
Admission: EM | Admit: 2020-12-14 | Discharge: 2020-12-14 | Disposition: A | Discharge status: Home / Self Care | Payer: Medicaid Other | Attending: Urgent Care | Admitting: Urgent Care

## 2020-12-14 ENCOUNTER — Other Ambulatory Visit: Payer: Self-pay

## 2020-12-14 DIAGNOSIS — H65192 Other acute nonsuppurative otitis media, left ear: Secondary | ICD-10-CM

## 2020-12-14 DIAGNOSIS — R519 Headache, unspecified: Secondary | ICD-10-CM

## 2020-12-14 MED ORDER — CETIRIZINE HCL 10 MG PO TABS: 10.0000 mg | ORAL_TABLET | Freq: Every day | ORAL | 0 refills | Status: DC

## 2020-12-14 MED ORDER — PSEUDOEPHEDRINE HCL 60 MG PO TABS: 60.0000 mg | ORAL_TABLET | Freq: Three times a day (TID) | ORAL | 0 refills | Status: DC | PRN

## 2020-12-14 MED ORDER — CEFDINIR 300 MG PO CAPS: 300.0000 mg | ORAL_CAPSULE | Freq: Two times a day (BID) | ORAL | 0 refills | Status: DC

## 2020-12-14 NOTE — ED Triage Notes (Signed)
Pt presents with headache xs 2 weeks that comes and goes. States ear pain started yesterday.

## 2020-12-14 NOTE — ED Provider Notes (Signed)
[place]   MRN: [id number] DOB: Aug 04, 1990  Subjective:   Heather Savage is a 30 y.o. female presenting for 2-week history of persistent sinus headaches that have improved but now has had worsening left ear pain with drainage and bleeding.  Patient has had a history of difficulty with her ears on both sides.  Has had tympanoplasty several times.  Has not followed up with an ear nose throat specialist in 4 years.  Denies fever, runny or stuffy nose, confusion, weakness, numbness or tingling, neck pain or neck stiffness, sore throat, cough.  No current facility-administered medications for this encounter.  Current Outpatient Medications:    Accu-Chek FastClix Lancets MISC, 1 Stick by Percutaneous route 4 (four) times daily. (Patient not taking: Reported on 10/08/2018), Disp: 100 each, Rfl: 12   acetaminophen (TYLENOL) 500 MG tablet, Take 2 tablets (1,000 mg total) by mouth every 6 (six) hours. (Patient not taking: Reported on 09/08/2019), Disp: 30 tablet, Rfl: 0   Blood Glucose Monitoring Suppl (ACCU-CHEK GUIDE) w/Device KIT, 1 Device by Percutaneous route 4 (four) times daily. (Patient not taking: Reported on 10/08/2018), Disp: 1 kit, Rfl: 0   glucose blood (ACCU-CHEK GUIDE) test strip, Use to check blood sugars four times a day as instructed (Patient not taking: Reported on 10/08/2018), Disp: 50 each, Rfl: 12   medroxyPROGESTERone (PROVERA) 10 MG tablet, Take 1 tablet (10 mg total) by mouth daily., Disp: 10 tablet, Rfl: 5   omeprazole (PRILOSEC) 20 MG capsule, Take 1 capsule (20 mg total) by mouth daily., Disp: 30 capsule, Rfl: 2   Prenat-FeAsp-Meth-FA-DHA w/o A (PRENATE PIXIE) 10-0.6-0.4-200 MG CAPS, Take 1 tablet by mouth daily. (Patient not taking: Reported on 09/08/2019), Disp: 30 capsule, Rfl: 12   traMADol (ULTRAM) 50 MG tablet, Take 1 tablet (50 mg total) by mouth every 6 (six) hours as needed for severe pain. (Patient not taking: Reported on 10/08/2018), Disp: 15 tablet,  Rfl: 0   Allergies  Allergen Reactions   Other     Dermabond - causes local skin reaction/rash and blister   Skin Adhesives [Cyanoacrylate] Dermatitis    Past Medical History:  Diagnosis Date   Anemia    states has low blood count, no current meds.   Hearing loss    right   History of gastritis    no current med.   History of gestational diabetes 07/16/2018   Normal postpartum GTT   History of malaria 2012   Preterm labor    Rh negative, maternal    Tympanic membrane perforation 05/2012   right     Past Surgical History:  Procedure Laterality Date   CHOLECYSTECTOMY N/A 09/27/2018   Procedure: LAPAROSCOPIC CHOLECYSTECTOMY WITH INTRAOPERATIVE CHOLANGIOGRAM;  Surgeon: Ileana Roup, MD;  Location: [facility];  Service: General;  Laterality: N/A;   ERCP N/A 09/28/2018   Procedure: ENDOSCOPIC RETROGRADE CHOLANGIOPANCREATOGRAPHY (ERCP);  Surgeon: Ladene Artist, MD;  Location: [facility];  Service: Endoscopy;  Laterality: N/A;   EXTERNAL EAR SURGERY     REMOVAL OF STONES  09/28/2018   Procedure: REMOVAL OF STONES;  Surgeon: Ladene Artist, MD;  Location: [facility];  Service: Endoscopy;;   SCAR REVISION  08/27/2011   Procedure: SCAR REVISION;  Surgeon: Cristine Polio, MD;  Location: [place];  Service: Plastics;  Laterality: N/A;  scar revision of forehead   SPHINCTEROTOMY  09/28/2018   Procedure: SPHINCTEROTOMY;  Surgeon: Ladene Artist, MD;  Location: [facility];  Service: Endoscopy;;  TYMPANOPLASTY  05/14/2011   Procedure: TYMPANOPLASTY;  Surgeon: Izora Gala, MD;  Location: [place];  Service: ENT;  Laterality: Left;  TYMPANOPLASYT AND  POSSIBLE OSSICULARPLASTY    TYMPANOPLASTY  10/10/2011   Procedure: TYMPANOPLASTY;  Surgeon: Izora Gala, MD;  Location: [place];  Service: ENT;  Laterality: Right;   TYMPANOPLASTY Right 06/30/2012   Procedure: REVISION RIGHT TYMPANOPLASTY;  Surgeon: Izora Gala, MD;  Location: [place];   Service: ENT;  Laterality: Right;    No family history on file.  Social History   Tobacco Use   Smoking status: Never   Smokeless tobacco: Never  Vaping Use   Vaping Use: Never used  Substance Use Topics   Alcohol use: Never   Drug use: Never    ROS   Objective:   Vitals: BP 108/72 (BP Location: Left Arm)   Pulse 69   Temp 98.5 F (36.9 C) (Oral)   Resp 16   LMP  (LMP Unknown)   SpO2 96%   Physical Exam Constitutional:      General: She is not in acute distress.    Appearance: Normal appearance. She is well-developed. She is not ill-appearing, toxic-appearing or diaphoretic.  HENT:     Head: Normocephalic and atraumatic.     Right Ear: Ear canal and external ear normal. No drainage or tenderness. No middle ear effusion. Tympanic membrane is not erythematous.     Left Ear: Ear canal and external ear normal. No drainage or tenderness.  No middle ear effusion. Tympanic membrane is not erythematous.     Ears:     Comments: Sclerosis of both ears, both are also opacified.  Left TM erythematous and bulging.  No frank bleeding.    Nose: Nose normal. No congestion or rhinorrhea.     Mouth/Throat:     Mouth: Mucous membranes are moist. No oral lesions.     Pharynx: Oropharynx is clear. No pharyngeal swelling, oropharyngeal exudate, posterior oropharyngeal erythema or uvula swelling.     Tonsils: No tonsillar exudate or tonsillar abscesses.  Eyes:     General: No scleral icterus.    Extraocular Movements: Extraocular movements intact.     Right eye: Normal extraocular motion.     Left eye: Normal extraocular motion.     Conjunctiva/sclera: Conjunctivae normal.     Pupils: Pupils are equal, round, and reactive to light.  Neck:     Meningeal: Brudzinski's sign and Kernig's sign absent.  Cardiovascular:     Rate and Rhythm: Normal rate.  Pulmonary:     Effort: Pulmonary effort is normal.  Musculoskeletal:     Cervical back: Normal range of motion and neck supple. No  rigidity.  Lymphadenopathy:     Cervical: No cervical adenopathy.  Skin:    General: Skin is warm and dry.  Neurological:     General: No focal deficit present.     Mental Status: She is alert and oriented to person, place, and time.     Cranial Nerves: No cranial nerve deficit.     Motor: No weakness.     Coordination: Coordination normal.     Gait: Gait normal.  Psychiatric:        Mood and Affect: Mood normal.        Behavior: Behavior normal.     Assessment and Plan :   PDMP not reviewed this encounter.  1. Other non-recurrent acute nonsuppurative otitis media of left ear   2. Sinus headache    As  there is a shortage of amoxicillin, Augmentin will be using cefdinir for recurrent otitis media.  Recommended Zyrtec and pseudoephedrine for supportive care including for her sinus headaches.  Recommended follow-up with an ENT specialist.  No signs of an acute encephalopathy. Counseled patient on potential for adverse effects with medications prescribed/recommended today, ER and return-to-clinic precautions discussed, patient verbalized understanding.    Jaynee Eagles, PA-C 12/14/20 1135

## 2021-01-29 NOTE — L&D Delivery Note (Signed)
Delivery Note At 8:13 PM a viable and healthy female was delivered via Vaginal, Spontaneous (Presentation:   Occiput Anterior).  APGAR: 9, 9; weight  pending.   Placenta status: Spontaneous, Intact.  Cord: 3 vessels with the following complications: None.    Anesthesia: None Episiotomy: None Lacerations: Periurethral, superficial, hemostatic Suture Repair:  n/a Est. Blood Loss (mL): 212  Mom to postpartum.  Baby to Couplet care / Skin to Skin.  Heather Savage is a 31 y.o. female [phone] with IUP at [redacted]w[redacted]d admitted for IOL for A2GDM .  She progressed with augmentation to 7 cm and was feeling mild intermittent pressure. CNM called to room with pt reporting she felt the baby coming and CNM arrived as RN delivered baby in the bed.  Infant crying immediately without stimulation. Cord clamping delayed by several minutes then clamped by CNM and cut by CNM.  Placenta intact and spontaneous, bleeding minimal.  Tiny periurethral laceration hemostatic and not repaired.  Mom and baby stable prior to transfer to postpartum. She plans on breastfeeding. She is undecided for birth control.   Sharen Counter 09/23/2021, 8:57 PM

## 2021-02-13 ENCOUNTER — Other Ambulatory Visit: Payer: Self-pay

## 2021-02-13 ENCOUNTER — Ambulatory Visit (HOSPITAL_COMMUNITY)
Admission: EM | Admit: 2021-02-13 | Discharge: 2021-02-13 | Disposition: A | Discharge status: Home / Self Care | Payer: Medicaid Other | Attending: Internal Medicine | Admitting: Internal Medicine

## 2021-02-13 ENCOUNTER — Encounter (HOSPITAL_COMMUNITY): Payer: Self-pay

## 2021-02-13 DIAGNOSIS — Z3201 Encounter for pregnancy test, result positive: Secondary | ICD-10-CM | POA: Insufficient documentation

## 2021-02-13 LAB — BASIC METABOLIC PANEL
Anion gap: 8 (ref 5–15)
BUN: 11 mg/dL (ref 6–20)
CO2: 21 mmol/L — ABNORMAL LOW (ref 22–32)
Calcium: 9 mg/dL (ref 8.9–10.3)
Chloride: 105 mmol/L (ref 98–111)
Creatinine, Ser: 0.58 mg/dL (ref 0.44–1.00)
GFR, Estimated: >60 mL/min (ref >60)
Glucose, Bld: 154 mg/dL — ABNORMAL HIGH (ref 70–99)
Potassium: 3.6 mmol/L (ref 3.5–5.1)
Sodium: 134 mmol/L — ABNORMAL LOW (ref 135–145)

## 2021-02-13 LAB — CBC WITH DIFFERENTIAL/PLATELET
Abs Immature Granulocytes: 0.02 10*3/uL (ref 0.00–0.07)
Basophils Absolute: 0 10*3/uL (ref 0.0–0.1)
Basophils Relative: 0 %
Eosinophils Absolute: 0.1 10*3/uL (ref 0.0–0.5)
Eosinophils Relative: 1 %
HCT: 35.5 % — ABNORMAL LOW (ref 36.0–46.0)
Hemoglobin: 12.9 g/dL (ref 12.0–15.0)
Immature Granulocytes: 0 %
Lymphocytes Relative: 29 %
Lymphs Abs: 2.4 10*3/uL (ref 0.7–4.0)
MCH: 32.3 pg (ref 26.0–34.0)
MCHC: 36.3 g/dL — ABNORMAL HIGH (ref 30.0–36.0)
MCV: 88.8 fL (ref 80.0–100.0)
Monocytes Absolute: 0.3 10*3/uL (ref 0.1–1.0)
Monocytes Relative: 4 %
Neutro Abs: 5.2 10*3/uL (ref 1.7–7.7)
Neutrophils Relative %: 66 %
Platelets: 276 10*3/uL (ref 150–400)
RBC: 4 MIL/uL (ref 3.87–5.11)
RDW: 12 % (ref 11.5–15.5)
WBC: 8 10*3/uL (ref 4.0–10.5)
nRBC: 0 % (ref 0.0–0.2)

## 2021-02-13 LAB — POC URINE PREG, ED: Preg Test, Ur: POSITIVE — AB

## 2021-02-13 MED ORDER — PRENATAL PLUS VITAMIN/MINERAL 27-1 MG PO TABS: 1.0000 | ORAL_TABLET | Freq: Every day | ORAL | Status: DC

## 2021-02-13 MED ORDER — DOXYLAMINE-PYRIDOXINE 10-10 MG PO TBEC: 1.0000 | DELAYED_RELEASE_TABLET | Freq: Every evening | ORAL | 0 refills | Status: DC | PRN

## 2021-02-13 NOTE — ED Triage Notes (Signed)
Pt presents with dizziness and nausea X 4 days .

## 2021-02-13 NOTE — ED Provider Notes (Signed)
[facility]    CSN: [id number] Arrival date & time: 02/13/21  1441      History   Chief Complaint Chief Complaint  Patient presents with   Dizziness   Nausea    HPI Heather Savage is a 31 y.o. female comes to the urgent care with 4-day history of dizziness, persistent nausea and palpitations.  Patient denies abdominal pain, distention or vaginal bleeding.  Her last menstrual period was 2 months ago.  No fever or chills.  No syncope or near syncopal episode.Marland Kitchen   HPI  Past Medical History:  Diagnosis Date   Anemia    states has low blood count, no current meds.   Hearing loss    right   History of gastritis    no current med.   History of gestational diabetes 07/16/2018   Normal postpartum GTT   History of malaria 2012   Preterm labor    Rh negative, maternal    Tympanic membrane perforation 05/2012   right    Patient Active Problem List   Diagnosis Date Noted   Elevated transaminase level    Right upper quadrant abdominal pain    Abnormal cholangiogram    S/P laparoscopic cholecystectomy 09/27/2018   Language barrier 01/02/2016   Hearing loss 05/01/2011    Past Surgical History:  Procedure Laterality Date   CHOLECYSTECTOMY N/A 09/27/2018   Procedure: LAPAROSCOPIC CHOLECYSTECTOMY WITH INTRAOPERATIVE CHOLANGIOGRAM;  Surgeon: Andria Meuse, MD;  [facility];  Service: General;  Laterality: N/A;   ERCP N/A 09/28/2018   Procedure: ENDOSCOPIC RETROGRADE CHOLANGIOPANCREATOGRAPHY (ERCP);  Surgeon: Meryl Dare, MD;  Location: [facility];  Service: Endoscopy;  Laterality: N/A;   EXTERNAL EAR SURGERY     REMOVAL OF STONES  09/28/2018   Procedure: REMOVAL OF STONES;  Surgeon: Meryl Dare, MD;  [facility];  Service: Endoscopy;;   SCAR REVISION  08/27/2011   Procedure: SCAR REVISION;  Surgeon: Louisa Second, MD;  Location: [facility];  Service: Plastics;  Laterality: N/A;  scar revision of forehead    SPHINCTEROTOMY  09/28/2018   Procedure: SPHINCTEROTOMY;  Surgeon: Meryl Dare, MD;  [facility];  Service: Endoscopy;;   TYMPANOPLASTY  05/14/2011   Procedure: TYMPANOPLASTY;  Surgeon: Serena Colonel, MD;  Location: [facility];  Service: ENT;  Laterality: Left;  TYMPANOPLASYT AND  POSSIBLE OSSICULARPLASTY    TYMPANOPLASTY  10/10/2011   Procedure: TYMPANOPLASTY;  Surgeon: Serena Colonel, MD;  Location: [facility];  Service: ENT;  Laterality: Right;   TYMPANOPLASTY Right 06/30/2012   Procedure: REVISION RIGHT TYMPANOPLASTY;  Surgeon: Serena Colonel, MD;  Location: [facility];  Service: ENT;  Laterality: Right;    OB History     Gravida  3   Para  2   Term  2   Preterm  0   AB  1   Living  2      SAB  1   IAB  0   Ectopic  0   Multiple  0   Live Births  2            Home Medications    Prior to Admission medications   Medication Sig Start Date End Date Taking? Authorizing Provider  Doxylamine-Pyridoxine 10-10 MG TBEC Take 1 tablet by mouth at bedtime as needed (nausea). 02/13/21  Yes Ciana Simmon, Britta Mccreedy, MD  Prenatal Vit-Fe Fumarate-FA (PRENATAL PLUS VITAMIN/MINERAL) 27-1 MG TABS Take 1 tablet by mouth daily. 02/13/21  Yes  Merrilee Jansky, MD  cetirizine (ZYRTEC ALLERGY) 10 MG tablet Take 1 tablet (10 mg total) by mouth daily. 12/14/20   Wallis Bamberg, PA-C  medroxyPROGESTERone (PROVERA) 10 MG tablet Take 1 tablet (10 mg total) by mouth daily. 10/04/19   Leftwich-Kirby, Wilmer Floor, CNM  omeprazole (PRILOSEC) 20 MG capsule Take 1 capsule (20 mg total) by mouth daily. 05/09/20   Rhys Martini, PA-C  pseudoephedrine (SUDAFED) 60 MG tablet Take 1 tablet (60 mg total) by mouth every 8 (eight) hours as needed for congestion. 12/14/20   Wallis Bamberg, PA-C    Family History History reviewed. No pertinent family history.  Social History Social History   Tobacco Use   Smoking status: Never   Smokeless tobacco: Never  Vaping Use   Vaping Use: Never  used  Substance Use Topics   Alcohol use: Never   Drug use: Never     Allergies   Other and Skin adhesives [cyanoacrylate]   Review of Systems Review of Systems  Constitutional:  Positive for fatigue.  HENT: Negative.    Respiratory: Negative.    Cardiovascular: Negative.   Gastrointestinal:  Positive for nausea. Negative for vomiting.  Genitourinary:  Negative for vaginal bleeding.  Musculoskeletal: Negative.     Physical Exam Triage Vital Signs ED Triage Vitals  Enc Vitals Group     BP 02/13/21 1531 104/62     Pulse Rate 02/13/21 1531 74     Resp --      Temp 02/13/21 1531 98.6 F (37 C)     Temp Source 02/13/21 1531 Oral     SpO2 02/13/21 1531 100 %     Weight --      Height --      Head Circumference --      Peak Flow --      Pain Score 02/13/21 1535 0     Pain Loc --      Pain Edu? --      Excl. in GC? --    No data found.  Updated Vital Signs BP 104/62 (BP Location: Left Arm)    Pulse 74    Temp 98.6 F (37 C) (Oral)    SpO2 100%   Visual Acuity Right Eye Distance:   Left Eye Distance:   Bilateral Distance:    Right Eye Near:   Left Eye Near:    Bilateral Near:     Physical Exam Vitals and nursing note reviewed.  Constitutional:      General: She is not in acute distress.    Appearance: She is not ill-appearing.  Cardiovascular:     Rate and Rhythm: Normal rate and regular rhythm.     Pulses: Normal pulses.     Heart sounds: Normal heart sounds.  Pulmonary:     Effort: Pulmonary effort is normal.     Breath sounds: Normal breath sounds.  Abdominal:     General: Bowel sounds are normal.     Palpations: Abdomen is soft.  Neurological:     Mental Status: She is alert.     UC Treatments / Results  Labs (all labs ordered are listed, but only abnormal results are displayed) Labs Reviewed  POC URINE PREG, ED - Abnormal; Notable for the following components:      Result Value   Preg Test, Ur POSITIVE (*)    All other components within  normal limits  CBC WITH DIFFERENTIAL/PLATELET  BASIC METABOLIC PANEL    EKG   Radiology No results found.  Procedures Procedures (including critical care time)  Medications Ordered in UC Medications - No data to display  Initial Impression / Assessment and Plan / UC Course  I have reviewed the triage vital signs and the nursing notes.  Pertinent labs & imaging results that were available during my care of the patient were reviewed by me and considered in my medical decision making (see chart for details).     1.  Positive pregnancy test: CBC Urine pregnancy test was positive Patient is advised to follow-up with obstetrician for care Prenatal vitamins, doxylamine-pyridoxine as needed for nausea. Final Clinical Impressions(s) / UC Diagnoses   Final diagnoses:  Positive pregnancy test     Discharge Instructions      Please make an appointment to see your obstetrician for pregnancy care We will call you with recommendations if labs are abnormal.   ED Prescriptions     Medication Sig Dispense Auth. Provider   Prenatal Vit-Fe Fumarate-FA (PRENATAL PLUS VITAMIN/MINERAL) 27-1 MG TABS Take 1 tablet by mouth daily. -- Merrilee JanskyLamptey, Farid Grigorian O, MD   Doxylamine-Pyridoxine 10-10 MG TBEC Take 1 tablet by mouth at bedtime as needed (nausea). 60 tablet Zavion Sleight, Britta MccreedyPhilip O, MD      PDMP not reviewed this encounter.   Merrilee JanskyLamptey, Starr Engel O, MD 02/13/21 [phone]

## 2021-02-13 NOTE — Discharge Instructions (Addendum)
Please make an appointment to see your obstetrician for pregnancy care We will call you with recommendations if labs are abnormal.

## 2021-02-19 IMAGING — US ULTRASOUND ABDOMEN LIMITED
1 series · 14 of 25 positions shown · non-contrast
Comparison: Ultrasound 08/26/2018

CLINICAL DATA: Right upper quadrant pain

EXAM:
ULTRASOUND ABDOMEN LIMITED RIGHT UPPER QUADRANT

[Series 1: ultrasound abdomen limited · 14 of 71 slices shown]
[im 1/71]
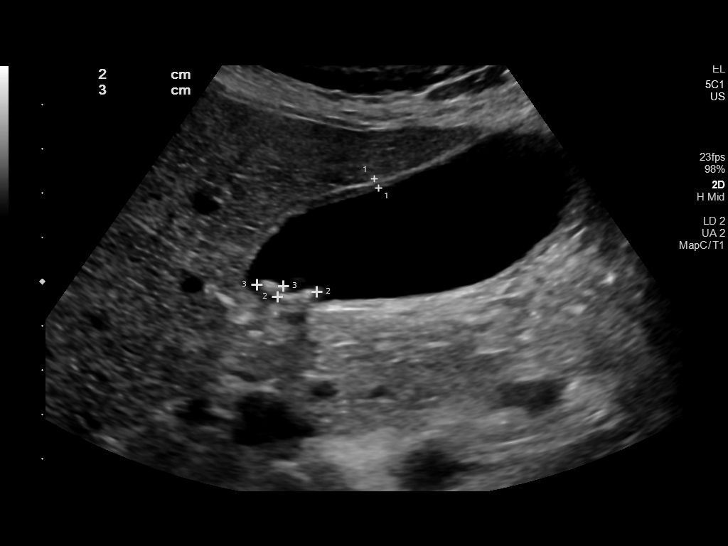
[im 6/71]
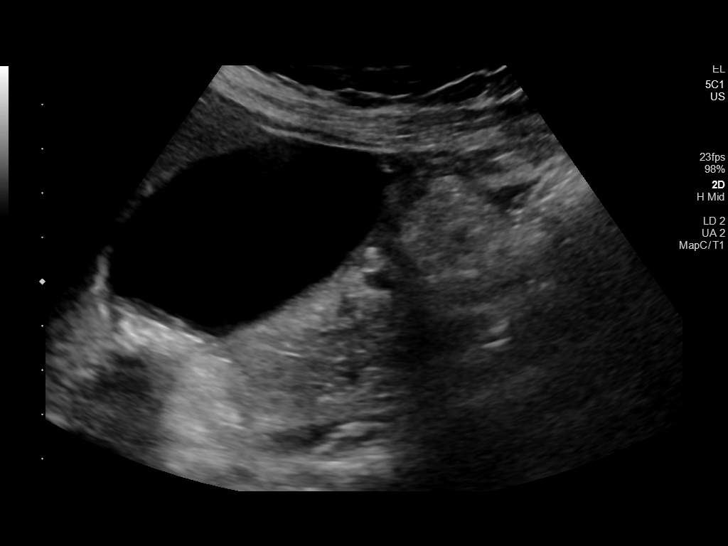
[im 12/71]
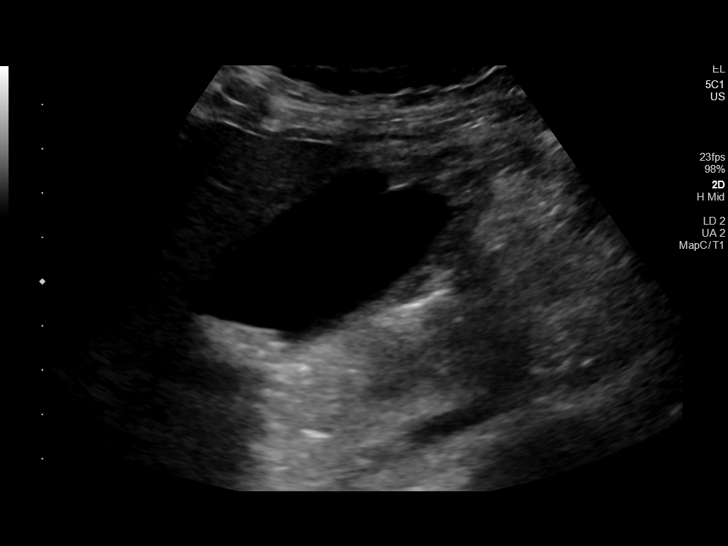
[im 18/71]
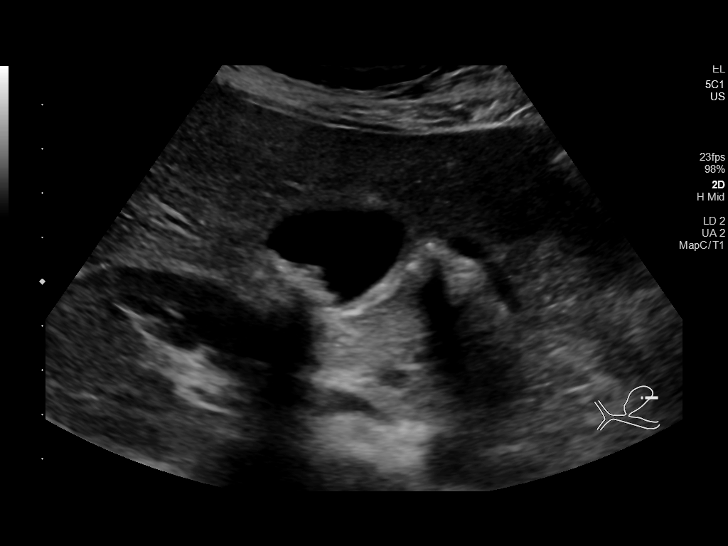
[im 24/71]
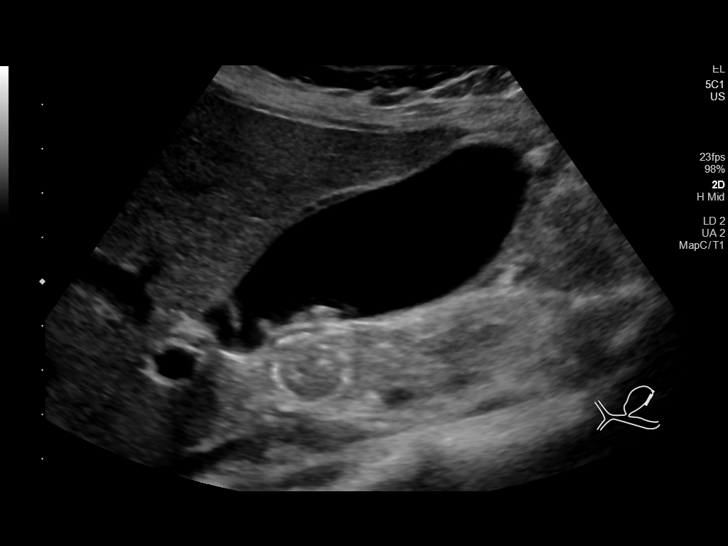
[im 27/71]
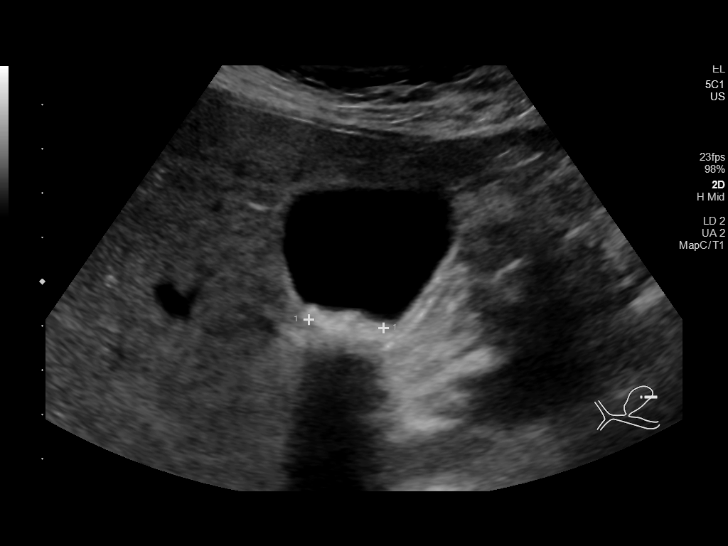
[im 33/71]
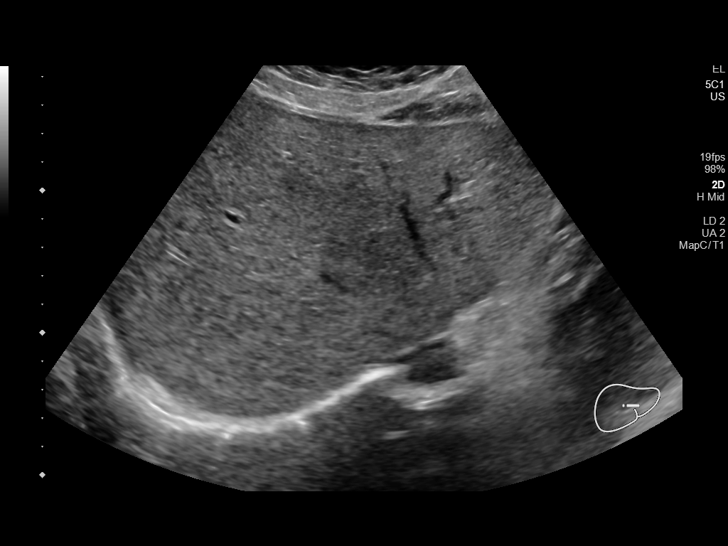
[im 38/71]
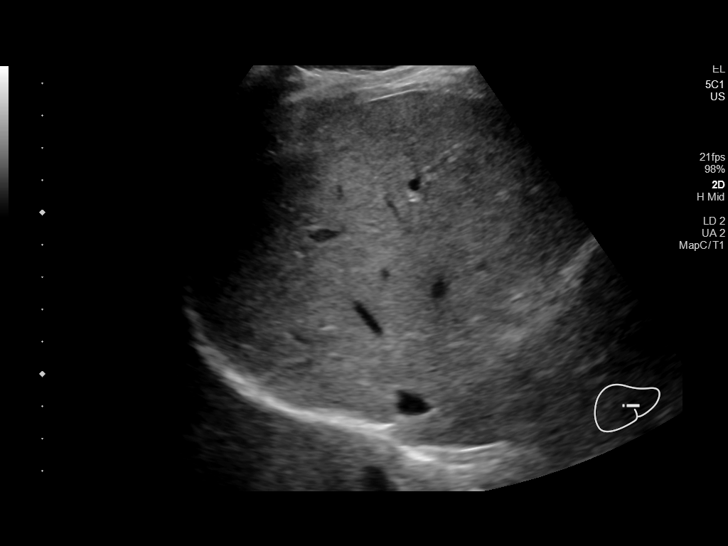
[im 44/71]
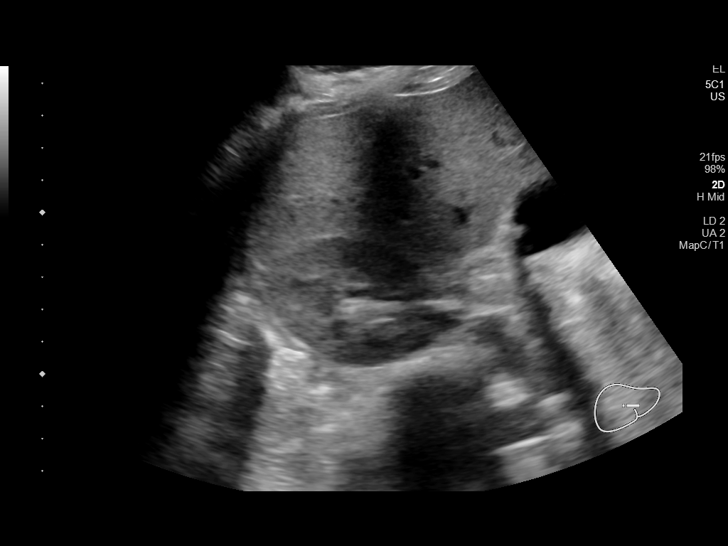
[im 47/71]
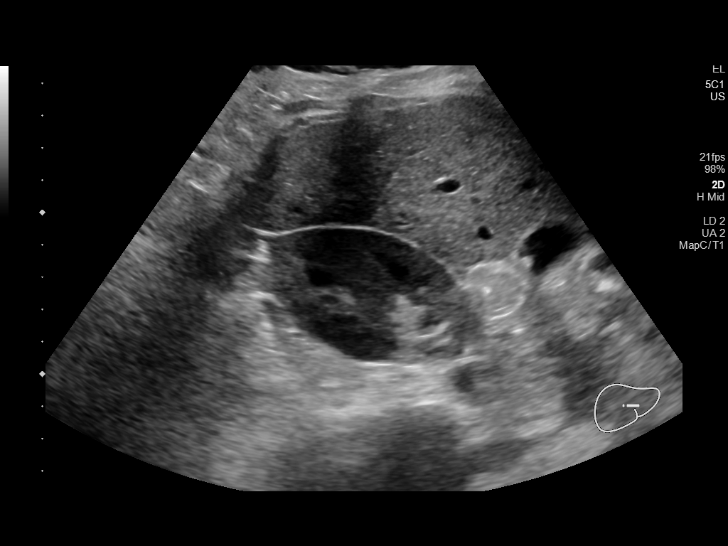
[im 53/71]
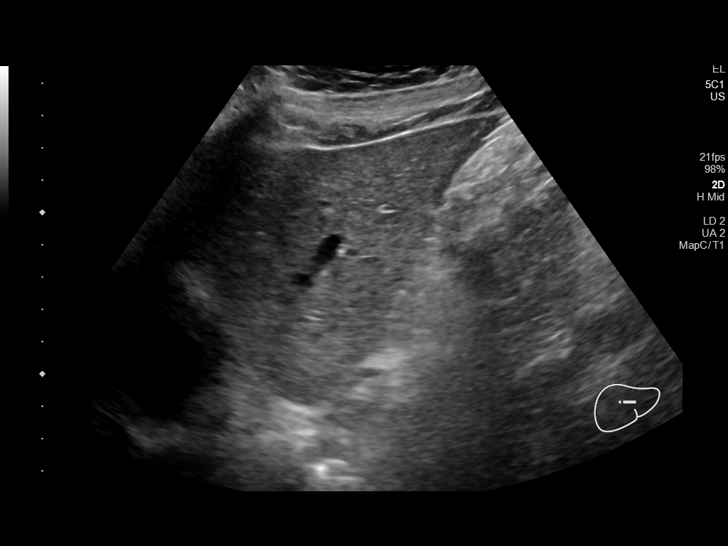
[im 59/71]
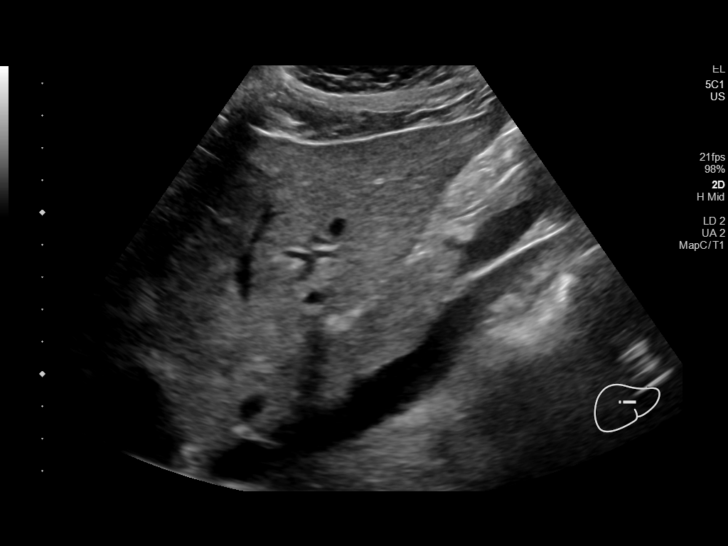
[im 65/71]
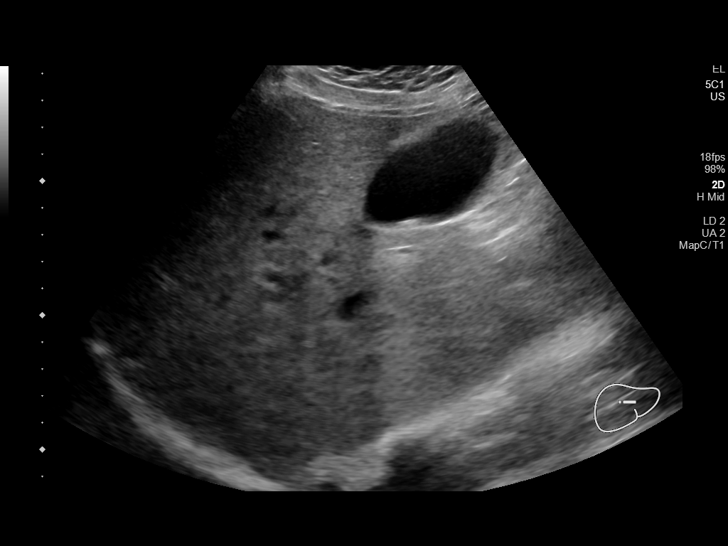
[im 71/71]
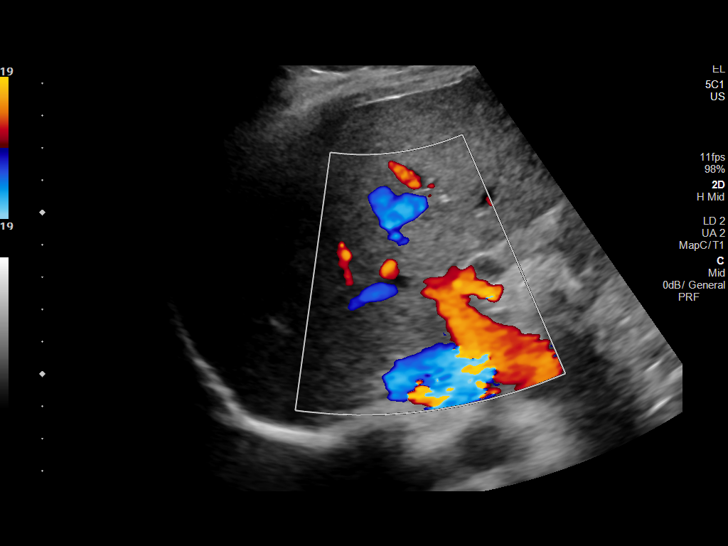

[14 of 25 positions shown; findings below may reference images not displayed]

FINDINGS: Gallbladder:

Multiple shadowing stones measuring up to 9 mm. Normal wall
thickness. No sonographic Murphy indicated.

Common bile duct:

Diameter: 4.7 mm

Liver:

No focal lesion identified. Within normal limits in parenchymal
echogenicity. Portal vein is patent on color Doppler imaging with
normal direction of blood flow towards the liver.

Other: None.
IMPRESSION: Cholelithiasis without sonographic evidence for acute cholecystitis
or biliary dilatation.

## 2021-03-10 ENCOUNTER — Ambulatory Visit (INDEPENDENT_AMBULATORY_CARE_PROVIDER_SITE_OTHER): Payer: Medicaid Other

## 2021-03-10 ENCOUNTER — Other Ambulatory Visit: Payer: Self-pay

## 2021-03-10 VITALS — BP 112/69 | HR 71 | Ht 63.0 in | Wt 153.3 lb

## 2021-03-10 DIAGNOSIS — O219 Vomiting of pregnancy, unspecified: Secondary | ICD-10-CM

## 2021-03-10 DIAGNOSIS — O3680X0 Pregnancy with inconclusive fetal viability, not applicable or unspecified: Secondary | ICD-10-CM

## 2021-03-10 DIAGNOSIS — Z3A11 11 weeks gestation of pregnancy: Secondary | ICD-10-CM

## 2021-03-10 DIAGNOSIS — Z3A10 10 weeks gestation of pregnancy: Secondary | ICD-10-CM | POA: Diagnosis not present

## 2021-03-10 DIAGNOSIS — Z3481 Encounter for supervision of other normal pregnancy, first trimester: Secondary | ICD-10-CM

## 2021-03-10 DIAGNOSIS — Z3490 Encounter for supervision of normal pregnancy, unspecified, unspecified trimester: Secondary | ICD-10-CM | POA: Insufficient documentation

## 2021-03-10 MED ORDER — CITRANATAL BLOOM 90-1 MG PO TABS: 1.0000 | ORAL_TABLET | Freq: Every day | ORAL | 11 refills | Status: DC

## 2021-03-10 MED ORDER — DICLEGIS 10-10 MG PO TBEC: 2.0000 | DELAYED_RELEASE_TABLET | Freq: Every day | ORAL | 5 refills | Status: DC

## 2021-03-10 MED ORDER — BLOOD PRESSURE KIT DEVI: 1.0000 | 0 refills | Status: DC

## 2021-03-10 MED ORDER — GOJJI WEIGHT SCALE MISC: 1.0000 | 0 refills | Status: DC

## 2021-03-10 NOTE — Progress Notes (Cosign Needed)
New OB Intake  I connected with  Heather Savage on 03/10/21 at  9:15 AM EST by in person Video Visit and verified that I am speaking with the correct person using two identifiers. Nurse is located at [facility] and pt is located at [place].  I discussed the limitations, risks, security and privacy concerns of performing an evaluation and management service by telephone and the availability of in person appointments. I also discussed with the patient that there may be a patient responsible charge related to this service. The patient expressed understanding and agreed to proceed.  I explained I am completing New OB Intake today. We discussed her EDD of 9/2//23 that is based on LMP of 12/24/20. Pt is G4/P2012. I reviewed her allergies, medications, Medical/Surgical/OB history, and appropriate screenings. I informed her of [facility] services. Based on history, this is a/an  pregnancy uncomplicated .   Patient Active Problem List   Diagnosis Date Noted   Supervision of normal pregnancy 03/10/2021   Elevated transaminase level    Right upper quadrant abdominal pain    Abnormal cholangiogram    S/P laparoscopic cholecystectomy 09/27/2018   Language barrier 01/02/2016   Hearing loss 05/01/2011    Concerns addressed today  Delivery Plans:  Plans to deliver at [facility].   MyChart/Babyscripts MyChart access verified. I explained pt will have some visits in office and some virtually. Babyscripts instructions given and order placed. Patient verifies receipt of registration text/e-mail. Account successfully created and app downloaded.  Blood Pressure Cuff  Blood pressure cuff ordered for patient to pick-up from [employer]. Explained after first prenatal appt pt will check weekly and document in 81.  Weight scale: Patient does not  have weight scale. Weight scale ordered for patient to pick up from [employer].   Anatomy US Explained first scheduled [place] will be around 19 weeks.  Dating and viability scan performed today.   Labs Discussed Johnsie Cancel genetic screening with patient. Would like both Panorama and Horizon drawn at new OB visit.Also if interested in genetic testing, tell patient she will need AFP 15-21 weeks to complete genetic testing .Routine prenatal labs needed.  Covid Vaccine Patient has not covid vaccine.    Informed patient of [facility] website  and placed link in her AVS.   Social Determinants of Health Food Insecurity: Patient denies food insecurity. WIC Referral: Patient is interested in referral to [facility]  Transportation: Patient denies transportation needs. Childcare: Discussed no children allowed at ultrasound appointments. Offered childcare services; patient declines childcare services at this time.  Send link to Pregnancy Navigators   Placed OB Box on problem list and updated  First visit review I reviewed new OB appt with pt. I explained she will have a pelvic exam, ob bloodwork with genetic screening, and PAP smear. Explained pt will be seen by to be determined at first visit; encounter routed to appropriate provider. Explained that patient will be seen by pregnancy navigator following visit with provider. [facility] information placed in AVS.   Lucianne Lei, RN 03/10/2021  10:05 AM

## 2021-03-16 DIAGNOSIS — Z3481 Encounter for supervision of other normal pregnancy, first trimester: Secondary | ICD-10-CM | POA: Diagnosis not present

## 2021-04-11 ENCOUNTER — Encounter: Payer: Medicaid Other | Admitting: Obstetrics & Gynecology

## 2021-04-12 ENCOUNTER — Other Ambulatory Visit (HOSPITAL_COMMUNITY)
Admission: RE | Admit: 2021-04-12 | Discharge: 2021-04-12 | Disposition: A | Discharge status: Home / Self Care | Payer: Medicaid Other | Source: Ambulatory Visit | Attending: Obstetrics and Gynecology | Admitting: Obstetrics and Gynecology

## 2021-04-12 ENCOUNTER — Other Ambulatory Visit: Payer: Self-pay

## 2021-04-12 ENCOUNTER — Ambulatory Visit (INDEPENDENT_AMBULATORY_CARE_PROVIDER_SITE_OTHER): Payer: Medicaid Other | Admitting: Obstetrics and Gynecology

## 2021-04-12 VITALS — BP 110/70 | HR 83 | Wt 156.1 lb

## 2021-04-12 DIAGNOSIS — Z6791 Unspecified blood type, Rh negative: Secondary | ICD-10-CM | POA: Diagnosis not present

## 2021-04-12 DIAGNOSIS — Z3482 Encounter for supervision of other normal pregnancy, second trimester: Secondary | ICD-10-CM

## 2021-04-12 DIAGNOSIS — Z8719 Personal history of other diseases of the digestive system: Secondary | ICD-10-CM

## 2021-04-12 DIAGNOSIS — O26892 Other specified pregnancy related conditions, second trimester: Secondary | ICD-10-CM | POA: Diagnosis not present

## 2021-04-12 DIAGNOSIS — Z3A15 15 weeks gestation of pregnancy: Secondary | ICD-10-CM

## 2021-04-12 DIAGNOSIS — Z8632 Personal history of gestational diabetes: Secondary | ICD-10-CM

## 2021-04-12 DIAGNOSIS — Z8759 Personal history of other complications of pregnancy, childbirth and the puerperium: Secondary | ICD-10-CM | POA: Diagnosis not present

## 2021-04-12 NOTE — Progress Notes (Signed)
? ?INITIAL PRENATAL VISIT NOTE ? ?Subjective:  ?Heather Savage is a 31 y.o. [id number] at 63w4dby LMP c/w 1st trimester ultrasound being seen today for her initial prenatal visit.  She has an obstetric history significant for SVD x 2 and diet controlled gestational diabetes. She has a medical history significant for cholelithiasis, malaria and cholecystectomy. ? ?Patient reports no complaints.  Contractions: Not present. Vag. Bleeding: None.  Movement: Absent. Denies leaking of fluid.  ? ? ?Past Medical History:  ?Diagnosis Date  ? Anemia   ? states has low blood count, no current meds.  ? Hearing loss   ? right  ? History of gastritis   ? no current med.  ? History of gestational diabetes 07/16/2018  ? Normal postpartum GTT  ? History of malaria 2012  ? Preterm labor   ? Rh negative, maternal   ? Tympanic membrane perforation 05/2012  ? right  ? ? ?Past Surgical History:  ?Procedure Laterality Date  ? CHOLECYSTECTOMY N/A 09/27/2018  ? Procedure: LAPAROSCOPIC CHOLECYSTECTOMY WITH INTRAOPERATIVE CHOLANGIOGRAM;  Surgeon: WIleana Roup MD;  Location: [facility];  Service: General;  Laterality: N/A;  ? ERCP N/A 09/28/2018  ? Procedure: ENDOSCOPIC RETROGRADE CHOLANGIOPANCREATOGRAPHY (ERCP);  Surgeon: SLadene Artist MD;  [facility];  Service: Endoscopy;  Laterality: N/A;  ? EXTERNAL EAR SURGERY    ? REMOVAL OF STONES  09/28/2018  ? Procedure: REMOVAL OF STONES;  Surgeon: SLadene Artist MD;  [facility];  Service: Endoscopy;;  ? SCAR REVISION  08/27/2011  ? Procedure: SCAR REVISION;  Surgeon: GCristine Polio MD;  [facility]  Service: Plastics;  Laterality: N/A;  scar revision of forehead  ? SPHINCTEROTOMY  09/28/2018  ? Procedure: SPHINCTEROTOMY;  Surgeon: SLadene Artist MD;  [facility];  Service: Endoscopy;;  ? TYMPANOPLASTY  05/14/2011  ? Procedure: TYMPANOPLASTY;  Surgeon: JIzora Gala MD;  [facility]  Service: ENT;   Laterality: Left;  TYMPANOPLASYT AND  POSSIBLE OSSICULARPLASTY   ? TYMPANOPLASTY  10/10/2011  ? Procedure: TYMPANOPLASTY;  Surgeon: JIzora Gala MD;  [facility]  Service: ENT;  Laterality: Right;  ? TYMPANOPLASTY Right 06/30/2012  ? Procedure: REVISION RIGHT TYMPANOPLASTY;  Surgeon: JIzora Gala MD;  [facility]  Service: ENT;  Laterality: Right;  ? ? ?OB History  ?Gravida Para Term Preterm AB Living  ?_0 0 1 2  ?SAB IAB Ectopic Multiple Live Births  ?1 0 0 0 2  ?  ?# Outcome Date GA Lbr Len/2nd Weight Sex Delivery Anes PTL Lv  ?4 Current           ?3 Term 09/09/18 363w2d2:38 / 00:06 6 lb 14.8 oz (3.14 kg) F Vag-Spont None  LIV  ?2 Term 06/27/16 3830w0d:38 / 00:30 7 lb 7.8 oz (3.396 kg) M Vag-Spont None  LIV  ?1 SAB 03/29/13          ? ? ?Social History  ? ?Socioeconomic History  ? Marital status: Single  ?  Spouse name: Not on file  ? Number of children: 1  ? Years of education: Not on file  ? Highest education level: Not on file  ?Occupational History  ? Not on file  ?Tobacco Use  ? Smoking status: Never  ? Smokeless tobacco: Never  ?Vaping Use  ? Vaping Use: Never used  ?Substance and Sexual Activity  ? Alcohol use: Never  ? Drug use: Never  ? Sexual  activity: Not Currently  ?  Birth control/protection: None  ?Other Topics Concern  ? Not on file  ?Social History Narrative  ? Immigrated from [place] in March 15, 2011.  [place] family background.  Planning on becoming permanent resident.  Lives with boyfriend.  [place] living [place]- no other family in [place].  [place]  ? ?Social Determinants of Health  ? ?Financial Resource Strain: Not on file  ?Food Insecurity: Not on file  ?Transportation Needs: Not on file  ?Physical Activity: Not on file  ?Stress: Not on file  ?Social Connections: Not on file  ? ? ?No family history on file. ? ? ?Current Outpatient Medications:  ?  Blood Pressure Monitoring (BLOOD PRESSURE KIT) DEVI, 1 kit by Does not apply route once a week., Disp: 1  each, Rfl: 0 ?  DICLEGIS 10-10 MG TBEC, Take 2 tablets by mouth at bedtime. If symptoms persist, add one tablet in the morning and one in the afternoon, Disp: 100 tablet, Rfl: 5 ?  Doxylamine-Pyridoxine 10-10 MG TBEC, Take 1 tablet by mouth at bedtime as needed (nausea)., Disp: 60 tablet, Rfl: 0 ?  Misc. Devices (GOJJI WEIGHT SCALE) MISC, 1 Device by Does not apply route every 30 (thirty) days., Disp: 1 each, Rfl: 0 ?  Prenatal-DSS-FeCb-FeGl-FA (CITRANATAL BLOOM) 90-1 MG TABS, Take 1 tablet by mouth daily., Disp: 30 tablet, Rfl: 11 ?  Prenatal Vit-Fe Fumarate-FA (PRENATAL PLUS VITAMIN/MINERAL) 27-1 MG TABS, Take 1 tablet by mouth daily. (Patient not taking: Reported on 03/10/2021), Disp: , Rfl:  ? ?Allergies  ?Allergen Reactions  ? Other   ?  Dermabond - causes local skin reaction/rash and blister  ? Skin Adhesives [Cyanoacrylate] Dermatitis  ? ? ?Review of Systems: Negative except for what is mentioned in HPI. ? ?Objective:  ? ?Vitals:  ? 04/12/21 0947  ?BP: 110/70  ?Pulse: 83  ?Weight: 156 lb 1.6 oz (70.8 kg)  ? ? ?Fetal Status: Fetal Heart Rate (bpm): 150 Fundal Height: 14 cm Movement: Absent    ? ?Physical Exam: ?BP 110/70   Pulse 83   Wt 156 lb 1.6 oz (70.8 kg)   LMP 12/24/2020   BMI 27.65 kg/m?  ?CONSTITUTIONAL: Well-developed, well-nourished female in no acute distress.  ?NEUROLOGIC: Alert and oriented to person, place, and time. Normal reflexes, muscle tone coordination. No cranial nerve deficit noted. ?PSYCHIATRIC: Normal mood and affect. Normal behavior. Normal judgment and thought content. ?SKIN: Skin is warm and dry. No rash noted. Not diaphoretic. No erythema. No pallor. ?HENT:  Normocephalic, atraumatic, External right and left ear normal. Oropharynx is clear and moist ?EYES: Conjunctivae and EOM are normal.  ?NECK: Normal range of motion, supple, no masses ?CARDIOVASCULAR: Normal heart rate noted, regular rhythm ?RESPIRATORY: Effort and breath sounds normal, no problems with respiration  noted ?BREASTS: deferred ?ABDOMEN: Soft, nontender, nondistended, gravid. ?GU: normal appearing external female genitalia, multiparous normal appearing cervix, scant white discharge in vagina, no lesions noted ?Bimanual: 14 weeks sized uterus, no adnexal tenderness or palpable lesions noted ?MUSCULOSKELETAL: Normal range of motion. ?EXT:  No edema and no tenderness. 2+ distal pulses. ? ? ?Assessment and Plan:  ?Pregnancy: [id number] at 32w4dLMP ? ?1. Encounter for supervision of other normal pregnancy in second trimester ?Continue routine care ?Will get anatomy scan in 4 weeks ? ?- Cytology - PAP( [place]) ?- Cervicovaginal ancillary only( ) ?- CBC/D/Plt+RPR+Rh+ABO+Rub Ab... ?- Genetic Screening ?- AFP, Serum, Open Spina Bifida ?- Urine Culture ?- Hemoglobin A1c ? ?2. [redacted] weeks gestation of pregnancy ? ?  3. Rh negative status during pregnancy in second trimester ?Rhogam at 28 weeks and after delivery ? ?4. History of gestational diabetes ?Will check A1c today ? ? ?Preterm labor symptoms and general obstetric precautions including but not limited to vaginal bleeding, contractions, leaking of fluid and fetal movement were reviewed in detail with the patient. ? ?Please refer to After Visit Summary for other counseling recommendations.  ? ?Return in about 4 weeks (around 05/10/2021) for ROB, in person. ? ?Griffin Basil ?04/12/2021 ?10:13 AM  ?

## 2021-04-13 LAB — HEMOGLOBIN A1C
Est. average glucose Bld gHb Est-mCnc: 111 mg/dL
Hgb A1c MFr Bld: 5.5 % (ref 4.8–5.6)

## 2021-04-13 LAB — CERVICOVAGINAL ANCILLARY ONLY
Chlamydia: NEGATIVE
Comment: NEGATIVE
Comment: NEGATIVE
Comment: NORMAL
Neisseria Gonorrhea: NEGATIVE
Trichomonas: NEGATIVE

## 2021-04-14 LAB — CBC/D/PLT+RPR+RH+ABO+RUBIGG...
Antibody Screen: NEGATIVE
Basophils Absolute: 0 10*3/uL (ref 0.0–0.2)
Basos: 0 %
EOS (ABSOLUTE): 0 10*3/uL (ref 0.0–0.4)
Eos: 0 %
HCV Ab: NONREACTIVE
HIV Screen 4th Generation wRfx: NONREACTIVE
Hematocrit: 31.9 % — ABNORMAL LOW (ref 34.0–46.6)
Hemoglobin: 10.9 g/dL — ABNORMAL LOW (ref 11.1–15.9)
Hepatitis B Surface Ag: NEGATIVE
Immature Grans (Abs): 0 10*3/uL (ref 0.0–0.1)
Immature Granulocytes: 0 %
Lymphocytes Absolute: 1.4 10*3/uL (ref 0.7–3.1)
Lymphs: 24 %
MCH: 31.3 pg (ref 26.6–33.0)
MCHC: 34.2 g/dL (ref 31.5–35.7)
MCV: 92 fL (ref 79–97)
Monocytes Absolute: 0.2 10*3/uL (ref 0.1–0.9)
Monocytes: 4 %
Neutrophils Absolute: 4.3 10*3/uL (ref 1.4–7.0)
Neutrophils: 72 %
Platelets: 236 10*3/uL (ref 150–450)
RBC: 3.48 x10E6/uL — ABNORMAL LOW (ref 3.77–5.28)
RDW: 13.5 % (ref 11.7–15.4)
RPR Ser Ql: NONREACTIVE
Rh Factor: NEGATIVE
Rubella Antibodies, IGG: 5.45 index (ref >0.99)
WBC: 5.9 10*3/uL (ref 3.4–10.8)

## 2021-04-14 LAB — AFP, SERUM, OPEN SPINA BIFIDA
AFP MoM: 0.9
AFP Value: 29.1 ng/mL
Gest. Age on Collection Date: 15 weeks
Maternal Age At EDD: 31.3 yr
OSBR Risk 1 IN: 10000
Test Results:: NEGATIVE
Weight: 156 [lb_av]

## 2021-04-14 LAB — HCV INTERPRETATION

## 2021-04-14 LAB — URINE CULTURE

## 2021-04-17 LAB — CYTOLOGY - PAP
Comment: NEGATIVE
Diagnosis: UNDETERMINED — AB
High risk HPV: NEGATIVE

## 2021-04-25 ENCOUNTER — Encounter: Payer: Self-pay | Admitting: Obstetrics and Gynecology

## 2021-05-09 ENCOUNTER — Ambulatory Visit: Payer: Medicaid Other | Admitting: *Deleted

## 2021-05-09 ENCOUNTER — Ambulatory Visit: Payer: Medicaid Other | Attending: Obstetrics and Gynecology

## 2021-05-09 ENCOUNTER — Encounter: Payer: Self-pay | Admitting: *Deleted

## 2021-05-09 VITALS — BP 111/63 | HR 73

## 2021-05-09 DIAGNOSIS — Z6791 Unspecified blood type, Rh negative: Secondary | ICD-10-CM | POA: Diagnosis not present

## 2021-05-09 DIAGNOSIS — O26892 Other specified pregnancy related conditions, second trimester: Secondary | ICD-10-CM | POA: Insufficient documentation

## 2021-05-09 DIAGNOSIS — Z3482 Encounter for supervision of other normal pregnancy, second trimester: Secondary | ICD-10-CM | POA: Diagnosis not present

## 2021-05-09 DIAGNOSIS — O09292 Supervision of pregnancy with other poor reproductive or obstetric history, second trimester: Secondary | ICD-10-CM | POA: Insufficient documentation

## 2021-05-09 DIAGNOSIS — Z3A19 19 weeks gestation of pregnancy: Secondary | ICD-10-CM | POA: Diagnosis not present

## 2021-05-09 DIAGNOSIS — O358XX0 Maternal care for other (suspected) fetal abnormality and damage, not applicable or unspecified: Secondary | ICD-10-CM | POA: Insufficient documentation

## 2021-05-09 DIAGNOSIS — Z363 Encounter for antenatal screening for malformations: Secondary | ICD-10-CM | POA: Diagnosis not present

## 2021-05-10 ENCOUNTER — Ambulatory Visit (INDEPENDENT_AMBULATORY_CARE_PROVIDER_SITE_OTHER): Payer: Medicaid Other | Admitting: Certified Nurse Midwife

## 2021-05-10 ENCOUNTER — Other Ambulatory Visit (HOSPITAL_COMMUNITY)
Admission: RE | Admit: 2021-05-10 | Discharge: 2021-05-10 | Disposition: A | Discharge status: Home / Self Care | Payer: Medicaid Other | Source: Ambulatory Visit | Attending: Certified Nurse Midwife | Admitting: Certified Nurse Midwife

## 2021-05-10 VITALS — BP 116/71 | HR 82 | Wt 159.8 lb

## 2021-05-10 DIAGNOSIS — R300 Dysuria: Secondary | ICD-10-CM | POA: Diagnosis not present

## 2021-05-10 DIAGNOSIS — Z3A19 19 weeks gestation of pregnancy: Secondary | ICD-10-CM

## 2021-05-10 DIAGNOSIS — K219 Gastro-esophageal reflux disease without esophagitis: Secondary | ICD-10-CM

## 2021-05-10 DIAGNOSIS — O99619 Diseases of the digestive system complicating pregnancy, unspecified trimester: Secondary | ICD-10-CM

## 2021-05-10 DIAGNOSIS — B3731 Acute candidiasis of vulva and vagina: Secondary | ICD-10-CM | POA: Diagnosis not present

## 2021-05-10 DIAGNOSIS — Z3492 Encounter for supervision of normal pregnancy, unspecified, second trimester: Secondary | ICD-10-CM

## 2021-05-10 DIAGNOSIS — O26892 Other specified pregnancy related conditions, second trimester: Secondary | ICD-10-CM | POA: Diagnosis not present

## 2021-05-10 LAB — POCT URINALYSIS DIPSTICK OB
Bilirubin, UA: NEGATIVE
Glucose, UA: NEGATIVE
Ketones, UA: NEGATIVE
Nitrite, UA: NEGATIVE
POC,PROTEIN,UA: NEGATIVE
Spec Grav, UA: 1.025 (ref 1.010–1.025)
Urobilinogen, UA: 0.2 E.U./dL
pH, UA: 6 (ref 5.0–8.0)

## 2021-05-10 MED ORDER — FAMOTIDINE 40 MG PO TABS: 40.0000 mg | ORAL_TABLET | Freq: Every day | ORAL | 5 refills | Status: DC

## 2021-05-10 MED ORDER — HYDROCORTISONE 1 % EX CREA: 1.0000 "application " | TOPICAL_CREAM | Freq: Two times a day (BID) | CUTANEOUS | 0 refills | Status: DC

## 2021-05-10 MED ORDER — MICONAZOLE NITRATE 2 % VA CREA: 1.0000 | TOPICAL_CREAM | Freq: Every day | VAGINAL | 2 refills | Status: DC

## 2021-05-10 NOTE — Progress Notes (Signed)
Pt c/o burning when urinating.  ?

## 2021-05-10 NOTE — Progress Notes (Signed)
? ?  PRENATAL VISIT NOTE ? ?Subjective:  ?Heather Savage is a 31 y.o. [phone] at [redacted]w[redacted]d being seen today for ongoing prenatal care.  She is currently monitored for the following issues for this low-risk pregnancy and has Hearing loss; Language barrier; Rh negative, maternal; History of gestational diabetes; S/P laparoscopic cholecystectomy; and Supervision of normal pregnancy on their problem list. ? ?Patient reports vaginal irritation.  Contractions: Not present. Vag. Bleeding: None.   . Denies leaking of fluid.  ? ?The following portions of the patient's history were reviewed and updated as appropriate: allergies, current medications, past family history, past medical history, past social history, past surgical history and problem list.  ? ?Objective:  ? ?Vitals:  ? 05/10/21 0959  ?BP: 116/71  ?Pulse: 82  ?Weight: 159 lb 12.8 oz (72.5 kg)  ? ? ?Fetal Status: Fetal Heart Rate (bpm): 159        ? ?General:  Alert, oriented and cooperative. Patient is in no acute distress.  ?Skin: Skin is warm and dry. No rash noted.   ?Cardiovascular: Normal heart rate noted  ?Respiratory: Normal respiratory effort, no problems with respiration noted  ?Abdomen: Soft, gravid, appropriate for gestational age.  Pain/Pressure: Absent     ?Pelvic: Exam performed with chaperone present. Redness and irritation noted on vulva with some skin breakdown just inside labia and tenderness when swabs collected. Thick         ?Extremities: Normal range of motion.  Edema: None  ?Mental Status: Normal mood and affect. Normal behavior. Normal judgment and thought content.  ? ?Assessment and Plan:  ?Pregnancy: [id number] at [redacted]w[redacted]d ?1. Supervision of low-risk pregnancy, second trimester ?- Doing well, feeling regular and vigorous fetal movement  ? ?2. [redacted] weeks gestation of pregnancy ?- Routine OB care  ? ?3. Yeast vaginitis ?- miconazole (MONISTAT 7) 2 % vaginal cream; Place 1 Applicatorful vaginally at bedtime. Apply for seven nights  Dispense: 30 g;  Refill: 2 ?- hydrocortisone cream (MONISTAT SOOTHING CARE ITCH) 1 %; Apply 1 application. topically 2 (two) times daily.  Dispense: 30 g; Refill: 0 ? ?4. Dysuria during pregnancy in second trimester ?- Likely related to vaginitis but will send for culture ?- POC Urinalysis Dipstick OB ?- Culture, OB Urine ? ?5. Gastroesophageal reflux in pregnancy ?- famotidine (PEPCID) 40 MG tablet; Take 1 tablet (40 mg total) by mouth daily.  Dispense: 30 tablet; Refill: 5 ? ?Preterm labor symptoms and general obstetric precautions including but not limited to vaginal bleeding, contractions, leaking of fluid and fetal movement were reviewed in detail with the patient. ?Please refer to After Visit Summary for other counseling recommendations.  ? ?Return in about 4 weeks (around 06/07/2021) for IN-PERSON, LOB. ? ?Future Appointments  ?Date Time Provider Department Center  ?06/07/2021  8:55 AM Corlis Hove, NP [facility] None  ? ? ?Bernerd Limbo, CNM ? ?

## 2021-05-10 NOTE — Addendum Note (Signed)
Addended by: Leola Brazil on: 05/10/2021 04:48 PM ? ? Modules accepted: Orders ? ?

## 2021-05-12 LAB — CERVICOVAGINAL ANCILLARY ONLY
Bacterial Vaginitis (gardnerella): POSITIVE — AB
Candida Glabrata: NEGATIVE
Candida Vaginitis: POSITIVE — AB
Comment: NEGATIVE
Comment: NEGATIVE
Comment: NEGATIVE

## 2021-05-12 LAB — URINE CULTURE, OB REFLEX

## 2021-05-12 LAB — CULTURE, OB URINE

## 2021-06-06 NOTE — Progress Notes (Signed)
? ?  PRENATAL VISIT NOTE ? ?Subjective:  ?Heather Savage is a 31 y.o. [phone] at [redacted]w[redacted]d being seen today for ongoing prenatal care.  She is currently monitored for the following issues for this low-risk pregnancy and has Hearing loss; Language barrier; Rh negative, maternal; History of gestational diabetes; S/P laparoscopic cholecystectomy; and Supervision of normal pregnancy on their problem list. ? ?Patient reports continued vaginal irritation from prior visit. Was unable to start Pepcid, but still reports indigestion.   Contractions: Not present. Vag. Bleeding: None.  Movement: Present. Denies leaking of fluid. Patient had a minor fall onto her right side that she shared with RN. Did not feel any symptoms or pain afterwards. Continued to feel good baby movement afterwards. No concerns today.  ? ?The following portions of the patient's history were reviewed and updated as appropriate: allergies, current medications, past family history, past medical history, past social history, past surgical history and problem list.  ? ?Objective:  ? ?Vitals:  ? 06/07/21 0914  ?BP: 106/65  ?Pulse: 87  ?Weight: 159 lb 11.2 oz (72.4 kg)  ? ? ?Fetal Status: Fetal Heart Rate (bpm): 149   Movement: Present    ? ?General:  Alert, oriented and cooperative. Patient is in no acute distress.  ?Skin: Skin is warm and dry. No rash noted.   ?Cardiovascular: Normal heart rate noted  ?Respiratory: Normal respiratory effort, no problems with respiration noted  ?Abdomen: Soft, gravid, appropriate for gestational age.  Pain/Pressure: Absent     ?Pelvic: Cervical exam deferred        ?Extremities: Normal range of motion.  Edema: Trace  ?Mental Status: Normal mood and affect. Normal behavior. Normal judgment and thought content.  ? ?Assessment and Plan:  ?Pregnancy: [id number] at [redacted]w[redacted]d ?1. Supervision of low-risk pregnancy, second trimester ?-Doing well, feeling consistent fetal movement ? ?2. Language barrier ?-interpreter present via ipad ? ?3.  Rh negative status during pregnancy in second trimester ?-Plan for Rhogam at next visit ? ?4. History of gestational diabetes ?- GTT at next visit ? ?5. [redacted] weeks gestation of pregnancy ?-Routine OB Care ? ?6. Vaginitis affecting pregnancy  ?-miconazole (monistat 7) 2% vaginal cream ordered ?-metroNIDAZOLE (FLAGYL) 500mg  ordered for treatment of BV ?- If symptoms do not improve in 7-10 days or worsen prior to next appointment , patient strongly encouraged to seek F/U care ? ?7. Gastroesophageal reflux in pregnancy ?-Pepcid reordered, patient did not start from last appointment ? ?Preterm labor symptoms and general obstetric precautions including but not limited to vaginal bleeding, contractions, leaking of fluid and fetal movement were reviewed in detail with the patient. ?Please refer to After Visit Summary for other counseling recommendations.  ? ?Return in about 30 days (around 07/07/2021) for LOB/GTT, IN-PERSON. ? ?Future Appointments  ?Date Time Provider Department Center  ?07/05/2021  8:15 AM [facility] None  ?07/05/2021  8:35 AM 09/04/2021, NP [facility] None  ? ? ? ?Corlis Hove, NP ? ?

## 2021-06-07 ENCOUNTER — Ambulatory Visit (INDEPENDENT_AMBULATORY_CARE_PROVIDER_SITE_OTHER): Payer: Medicaid Other | Admitting: Student

## 2021-06-07 ENCOUNTER — Encounter: Payer: Self-pay | Admitting: Student

## 2021-06-07 VITALS — BP 106/65 | HR 87 | Wt 159.7 lb

## 2021-06-07 DIAGNOSIS — N760 Acute vaginitis: Secondary | ICD-10-CM

## 2021-06-07 DIAGNOSIS — O23592 Infection of other part of genital tract in pregnancy, second trimester: Secondary | ICD-10-CM

## 2021-06-07 DIAGNOSIS — B3731 Acute candidiasis of vulva and vagina: Secondary | ICD-10-CM

## 2021-06-07 DIAGNOSIS — Z3482 Encounter for supervision of other normal pregnancy, second trimester: Secondary | ICD-10-CM

## 2021-06-07 DIAGNOSIS — O26892 Other specified pregnancy related conditions, second trimester: Secondary | ICD-10-CM

## 2021-06-07 DIAGNOSIS — Z3492 Encounter for supervision of normal pregnancy, unspecified, second trimester: Secondary | ICD-10-CM

## 2021-06-07 DIAGNOSIS — O99619 Diseases of the digestive system complicating pregnancy, unspecified trimester: Secondary | ICD-10-CM

## 2021-06-07 DIAGNOSIS — Z3A23 23 weeks gestation of pregnancy: Secondary | ICD-10-CM

## 2021-06-07 DIAGNOSIS — Z789 Other specified health status: Secondary | ICD-10-CM

## 2021-06-07 DIAGNOSIS — K219 Gastro-esophageal reflux disease without esophagitis: Secondary | ICD-10-CM

## 2021-06-07 DIAGNOSIS — Z6791 Unspecified blood type, Rh negative: Secondary | ICD-10-CM

## 2021-06-07 DIAGNOSIS — Z8632 Personal history of gestational diabetes: Secondary | ICD-10-CM

## 2021-06-07 MED ORDER — FAMOTIDINE 40 MG PO TABS: 40.0000 mg | ORAL_TABLET | Freq: Every day | ORAL | 5 refills | Status: DC

## 2021-06-07 MED ORDER — METRONIDAZOLE 500 MG PO TABS: 500.0000 mg | ORAL_TABLET | Freq: Two times a day (BID) | ORAL | 0 refills | Status: DC

## 2021-06-07 MED ORDER — MICONAZOLE NITRATE 2 % VA CREA: 1.0000 | TOPICAL_CREAM | Freq: Every day | VAGINAL | 2 refills | Status: DC

## 2021-06-07 NOTE — Progress Notes (Signed)
Pt use using Monistat cream for vaginal itch but states it caused burning so she stopped using it. She is still having vaginal itching today. She also c/o of increased urination and discharge. Pt states she had a fall last week and did not seek medical attention. She has not had any pain but is concerned. ?

## 2021-07-05 ENCOUNTER — Other Ambulatory Visit: Payer: Medicaid Other

## 2021-07-05 ENCOUNTER — Ambulatory Visit (INDEPENDENT_AMBULATORY_CARE_PROVIDER_SITE_OTHER): Payer: Medicaid Other | Admitting: Obstetrics & Gynecology

## 2021-07-05 VITALS — BP 101/68 | HR 89 | Wt 166.0 lb

## 2021-07-05 DIAGNOSIS — Z3A27 27 weeks gestation of pregnancy: Secondary | ICD-10-CM

## 2021-07-05 DIAGNOSIS — O360120 Maternal care for anti-D [Rh] antibodies, second trimester, not applicable or unspecified: Secondary | ICD-10-CM | POA: Diagnosis not present

## 2021-07-05 DIAGNOSIS — O26893 Other specified pregnancy related conditions, third trimester: Secondary | ICD-10-CM

## 2021-07-05 DIAGNOSIS — Z3483 Encounter for supervision of other normal pregnancy, third trimester: Secondary | ICD-10-CM

## 2021-07-05 DIAGNOSIS — O26892 Other specified pregnancy related conditions, second trimester: Secondary | ICD-10-CM

## 2021-07-05 DIAGNOSIS — O360920 Maternal care for other rhesus isoimmunization, second trimester, not applicable or unspecified: Secondary | ICD-10-CM

## 2021-07-05 DIAGNOSIS — Z6791 Unspecified blood type, Rh negative: Secondary | ICD-10-CM

## 2021-07-05 DIAGNOSIS — O24419 Gestational diabetes mellitus in pregnancy, unspecified control: Secondary | ICD-10-CM

## 2021-07-05 DIAGNOSIS — Z8632 Personal history of gestational diabetes: Secondary | ICD-10-CM

## 2021-07-05 DIAGNOSIS — Z789 Other specified health status: Secondary | ICD-10-CM

## 2021-07-05 LAB — POCT URINALYSIS DIPSTICK
Bilirubin, UA: NEGATIVE
Glucose, UA: NEGATIVE
Ketones, UA: NEGATIVE
Nitrite, UA: NEGATIVE
Protein, UA: POSITIVE — AB
Spec Grav, UA: 1.015 (ref 1.010–1.025)
Urobilinogen, UA: 0.2 E.U./dL
pH, UA: 6 (ref 5.0–8.0)

## 2021-07-05 MED ORDER — RHO D IMMUNE GLOBULIN 1500 UNIT/2ML IJ SOSY
300.0000 ug | PREFILLED_SYRINGE | Freq: Once | INTRAMUSCULAR | Status: AC
  Administered 2021-07-05: 300 ug via INTRAMUSCULAR

## 2021-07-05 NOTE — Progress Notes (Signed)
   PRENATAL VISIT NOTE  Subjective:  Heather Savage is a 31 y.o. [phone] at [redacted]w[redacted]d being seen today for ongoing prenatal care.  She is currently monitored for the following issues for this low-risk pregnancy and has Hearing loss; Language barrier; Rh negative, maternal; History of gestational diabetes; S/P laparoscopic cholecystectomy; and Supervision of normal pregnancy on their problem list.  Patient reports no complaints.  Contractions: Not present. Vag. Bleeding: None.  Movement: Present. Denies leaking of fluid.   The following portions of the patient's history were reviewed and updated as appropriate: allergies, current medications, past family history, past medical history, past social history, past surgical history and problem list.   Objective:   Vitals:   07/05/21 0830  BP: 101/68  Pulse: 89  Weight: 166 lb (75.3 kg)    Fetal Status: Fetal Heart Rate (bpm): 155   Movement: Present     General:  Alert, oriented and cooperative. Patient is in no acute distress.  Skin: Skin is warm and dry. No rash noted.   Cardiovascular: Normal heart rate noted  Respiratory: Normal respiratory effort, no problems with respiration noted  Abdomen: Soft, gravid, appropriate for gestational age.  Pain/Pressure: Absent     Pelvic: Cervical exam deferred        Extremities: Normal range of motion.     Mental Status: Normal mood and affect. Normal behavior. Normal judgment and thought content.   Assessment and Plan:  Pregnancy: [id number] at [redacted]w[redacted]d 1. Encounter for supervision of other normal pregnancy in third trimester   2. History of gestational diabetes No orders of the defined types were placed in this encounter.    3. Language barrier Amharic  4. Rh negative status during pregnancy in third trimester Orders Placed This Encounter  Procedures   Glucose Tolerance, 2 Hours w/1 Hour   RPR   CBC   HIV antibody (with reflex)   POCT urinalysis dipstick   Meds ordered this  encounter  Medications   rho (d) immune globulin (RHIG/RHOPHYLAC) injection 300 mcg     Preterm labor symptoms and general obstetric precautions including but not limited to vaginal bleeding, contractions, leaking of fluid and fetal movement were reviewed in detail with the patient. Please refer to After Visit Summary for other counseling recommendations.   No follow-ups on file.  No future appointments.  Scheryl Darter, MD

## 2021-07-06 LAB — CBC
Hematocrit: 34.8 % (ref 34.0–46.6)
Hemoglobin: 12.1 g/dL (ref 11.1–15.9)
MCH: 33.1 pg — ABNORMAL HIGH (ref 26.6–33.0)
MCHC: 34.8 g/dL (ref 31.5–35.7)
MCV: 95 fL (ref 79–97)
Platelets: 192 10*3/uL (ref 150–450)
RBC: 3.66 x10E6/uL — ABNORMAL LOW (ref 3.77–5.28)
RDW: 12.7 % (ref 11.7–15.4)
WBC: 6.8 10*3/uL (ref 3.4–10.8)

## 2021-07-06 LAB — RPR: RPR Ser Ql: NONREACTIVE

## 2021-07-06 LAB — GLUCOSE TOLERANCE, 2 HOURS W/ 1HR
Glucose, 1 hour: 186 mg/dL — ABNORMAL HIGH (ref 70–179)
Glucose, 2 hour: 140 mg/dL (ref 70–152)
Glucose, Fasting: 104 mg/dL — ABNORMAL HIGH (ref 70–91)

## 2021-07-06 LAB — HIV ANTIBODY (ROUTINE TESTING W REFLEX): HIV Screen 4th Generation wRfx: NONREACTIVE

## 2021-07-07 LAB — URINE CULTURE, OB REFLEX

## 2021-07-07 LAB — CULTURE, OB URINE

## 2021-07-18 DIAGNOSIS — O24419 Gestational diabetes mellitus in pregnancy, unspecified control: Secondary | ICD-10-CM | POA: Insufficient documentation

## 2021-07-18 NOTE — Progress Notes (Signed)
Gestational diabetes, needs appointment with diabetes educator

## 2021-07-20 ENCOUNTER — Encounter: Payer: Medicaid Other | Admitting: Obstetrics and Gynecology

## 2021-07-21 ENCOUNTER — Telehealth: Payer: Self-pay | Admitting: Emergency Medicine

## 2021-07-21 ENCOUNTER — Other Ambulatory Visit: Payer: Self-pay | Admitting: Emergency Medicine

## 2021-07-21 DIAGNOSIS — O24410 Gestational diabetes mellitus in pregnancy, diet controlled: Secondary | ICD-10-CM

## 2021-07-21 MED ORDER — ACCU-CHEK SOFTCLIX LANCETS MISC: 1.0000 | Freq: Four times a day (QID) | 12 refills | Status: DC

## 2021-07-21 MED ORDER — ACCU-CHEK GUIDE W/DEVICE KIT: 1.0000 | PACK | Freq: Four times a day (QID) | 0 refills | Status: DC

## 2021-07-21 MED ORDER — GLUCOSE BLOOD VI STRP: ORAL_STRIP | 12 refills | Status: DC

## 2021-07-21 NOTE — Telephone Encounter (Signed)
TC with patient to discuss diabetes education, diabetic supplies.

## 2021-07-27 ENCOUNTER — Other Ambulatory Visit: Payer: Self-pay

## 2021-07-27 ENCOUNTER — Encounter: Payer: Medicaid Other | Attending: Obstetrics and Gynecology | Admitting: Registered"

## 2021-07-27 ENCOUNTER — Ambulatory Visit (INDEPENDENT_AMBULATORY_CARE_PROVIDER_SITE_OTHER): Payer: Medicaid Other | Admitting: Registered"

## 2021-07-27 DIAGNOSIS — Z713 Dietary counseling and surveillance: Secondary | ICD-10-CM | POA: Diagnosis not present

## 2021-07-27 DIAGNOSIS — O24410 Gestational diabetes mellitus in pregnancy, diet controlled: Secondary | ICD-10-CM | POA: Diagnosis not present

## 2021-07-27 DIAGNOSIS — Z3A00 Weeks of gestation of pregnancy not specified: Secondary | ICD-10-CM | POA: Diagnosis not present

## 2021-07-27 DIAGNOSIS — O24419 Gestational diabetes mellitus in pregnancy, unspecified control: Secondary | ICD-10-CM

## 2021-07-27 NOTE — Progress Notes (Signed)
[employer] Video [employer] #[id number] provided interpretation for the Waiver to use [facility] interpreter. Pt's husband is fluent in [place]. Pt speaks some English.  Patient was seen for Gestational Diabetes self-management on 07/28/21  Start time 1410 and End time 1515   Estimated due date: 09/30/21; [redacted]w[redacted]d  Clinical: Medications: pepcid, prenatal vitamins Medical History: GDM Labs: OGTT FBS 104, 1-hr 186, A1c 5.5%   Dietary and Lifestyle History: Patient states she eats 2+ c of rice or pasta with many meals in addition to injera (sponge bread).   Patient has started checking blood sugar and all FBS are elevated. PPBG are mostly elevated but close to target. Pt is checking 2-hrs AFTER finishing meal. Pt instructed to start log sheet with changing the timing of PPBG to when she starts her meal.  Physical Activity: evening walk 2-3x/ week 30 min Stress: not assessed Sleep: not assessed  24 hr Recall:  First Meal: cereal OR oatmeal, fruit OR proridge OR beans, eggs (when husband cooks) Snack: Second meal: injera, lentils, beans, rice Snack: fruit Third meal: chicken, rice or pasta Snack:  Beverages: ~32 oz water , 1/2 c apple juice, soda 1x week  NUTRITION INTERVENTION  Nutrition education (E-1) on the following topics:   Initial Follow-up  [x]  []  Definition of Gestational Diabetes [x]  []  Why dietary management is important in controlling blood glucose [x]  []  Effects each nutrient has on blood glucose levels [x]  []  Simple carbohydrates vs complex carbohydrates [x]  []  Fluid intake [x]  []  Creating a balanced meal plan [x]  []  Carbohydrate counting  [x]  []  When to check blood glucose levels [x]  []  Proper blood glucose monitoring techniques [x]  []  Effect of stress and stress reduction techniques  [x]  []  Exercise effect on blood glucose levels, appropriate exercise during pregnancy [x]  []  Importance of limiting caffeine and abstaining from alcohol and smoking [x]  []  Medications used for  blood sugar control during pregnancy [x]  []  Hypoglycemia and rule of 15 [x]  []  Postpartum self care  Patient already has a meter, has been testing x5 days pre breakfast and 2 hours after each meal. FBS: 97-109 mg/dL Postprandial: mg/dL (2 hrs after finishing meal)  Patient instructed to monitor glucose levels: FBS: 60 - ? 95 mg/dL (some clinics use 90 for cutoff) 1 hour: ? 140 mg/dL 2 hour: ? mg/dL  Patient received handouts: Nutrition Diabetes and Pregnancy Carbohydrate Counting List  Patient will be seen for follow-up as needed.

## 2021-08-08 ENCOUNTER — Ambulatory Visit (INDEPENDENT_AMBULATORY_CARE_PROVIDER_SITE_OTHER): Payer: Medicaid Other | Admitting: Obstetrics & Gynecology

## 2021-08-08 VITALS — BP 103/64 | HR 77 | Wt 170.6 lb

## 2021-08-08 DIAGNOSIS — O24419 Gestational diabetes mellitus in pregnancy, unspecified control: Secondary | ICD-10-CM

## 2021-08-08 DIAGNOSIS — H9192 Unspecified hearing loss, left ear: Secondary | ICD-10-CM

## 2021-08-08 DIAGNOSIS — H6062 Unspecified chronic otitis externa, left ear: Secondary | ICD-10-CM

## 2021-08-08 DIAGNOSIS — Z3483 Encounter for supervision of other normal pregnancy, third trimester: Secondary | ICD-10-CM

## 2021-08-08 DIAGNOSIS — Z3A32 32 weeks gestation of pregnancy: Secondary | ICD-10-CM

## 2021-08-08 MED ORDER — METFORMIN HCL 500 MG PO TABS: 500.0000 mg | ORAL_TABLET | Freq: Every day | ORAL | 5 refills | Status: DC

## 2021-08-08 MED ORDER — CIPRO HC 0.2-1 % OT SUSP: 3.0000 [drp] | Freq: Two times a day (BID) | OTIC | 0 refills | Status: DC

## 2021-08-08 NOTE — Progress Notes (Unsigned)
   PRENATAL VISIT NOTE  Subjective:  Heather Savage is a 31 y.o. [phone] at [redacted]w[redacted]d being seen today for ongoing prenatal care.  She is currently monitored for the following issues for this high-risk pregnancy and has Hearing loss; Language barrier; Rh negative, maternal; History of gestational diabetes; S/P laparoscopic cholecystectomy; Supervision of normal pregnancy; and Gestational diabetes on their problem list.  Patient reports  left ear discharge and pain .  Contractions: Not present. Vag. Bleeding: None.  Movement: Present. Denies leaking of fluid.   The following portions of the patient's history were reviewed and updated as appropriate: allergies, current medications, past family history, past medical history, past social history, past surgical history and problem list.   Objective:   Vitals:   08/08/21 1347  BP: 103/64  Pulse: 77  Weight: 170 lb 9.6 oz (77.4 kg)    Fetal Status: Fetal Heart Rate (bpm): 138   Movement: Present     General:  Alert, oriented and cooperative. Patient is in no acute distress.  Skin: Skin is warm and dry. No rash noted.   Cardiovascular: Normal heart rate noted  Respiratory: Normal respiratory effort, no problems with respiration noted  Abdomen: Soft, gravid, appropriate for gestational age.  Pain/Pressure: Absent     Pelvic: Cervical exam deferred        Extremities: Normal range of motion.  Edema: None  Mental Status: Normal mood and affect. Normal behavior. Normal judgment and thought content.  Left ear canal yellow discharge, TM looks ok Assessment and Plan:  Pregnancy: [id number] at [redacted]w[redacted]d 1. Gestational diabetes mellitus (GDM) in third trimester, gestational diabetes method of control unspecified FBS > 100 and PP 50% >125, needs oral agent but she might wait one week to begin  2. Encounter for supervision of other normal pregnancy in third trimester   3. Hearing loss of left ear, unspecified hearing loss type   4. Chronic otitis  externa of left ear, unspecified type  - ciprofloxacin-hydrocortisone (CIPRO HC) OTIC suspension; Place 3 drops into the left ear 2 (two) times daily. Use for 14 days  Dispense: 10 mL; Refill: 0  Preterm labor symptoms and general obstetric precautions including but not limited to vaginal bleeding, contractions, leaking of fluid and fetal movement were reviewed in detail with the patient. Please refer to After Visit Summary for other counseling recommendations.   Return in about 1 week (around 08/15/2021).  Future Appointments  Date Time Provider Department Center  08/10/2021  3:15 PM [facility]  08/15/2021  1:30 PM Constant, Gigi Gin, MD [facility] None    Scheryl Darter, MD

## 2021-08-08 NOTE — Progress Notes (Unsigned)
ROB 32.[redacted] wks GA GDM, met with educator, has log

## 2021-08-10 ENCOUNTER — Other Ambulatory Visit: Payer: Medicaid Other

## 2021-08-15 ENCOUNTER — Encounter: Payer: Medicaid Other | Admitting: Obstetrics and Gynecology

## 2021-08-24 ENCOUNTER — Encounter: Payer: Self-pay | Admitting: Obstetrics and Gynecology

## 2021-08-24 ENCOUNTER — Ambulatory Visit (INDEPENDENT_AMBULATORY_CARE_PROVIDER_SITE_OTHER): Payer: Medicaid Other | Admitting: Obstetrics and Gynecology

## 2021-08-24 VITALS — BP 97/61 | HR 108 | Wt 165.5 lb

## 2021-08-24 DIAGNOSIS — O24410 Gestational diabetes mellitus in pregnancy, diet controlled: Secondary | ICD-10-CM

## 2021-08-24 DIAGNOSIS — Z3A34 34 weeks gestation of pregnancy: Secondary | ICD-10-CM | POA: Diagnosis not present

## 2021-08-24 DIAGNOSIS — H6062 Unspecified chronic otitis externa, left ear: Secondary | ICD-10-CM

## 2021-08-24 DIAGNOSIS — Z789 Other specified health status: Secondary | ICD-10-CM

## 2021-08-24 DIAGNOSIS — O360930 Maternal care for other rhesus isoimmunization, third trimester, not applicable or unspecified: Secondary | ICD-10-CM | POA: Diagnosis not present

## 2021-08-24 DIAGNOSIS — Z6791 Unspecified blood type, Rh negative: Secondary | ICD-10-CM

## 2021-08-24 DIAGNOSIS — Z3483 Encounter for supervision of other normal pregnancy, third trimester: Secondary | ICD-10-CM

## 2021-08-24 DIAGNOSIS — O26893 Other specified pregnancy related conditions, third trimester: Secondary | ICD-10-CM

## 2021-08-24 MED ORDER — RHO D IMMUNE GLOBULIN 1500 UNIT/2ML IJ SOSY
300.0000 ug | PREFILLED_SYRINGE | Freq: Once | INTRAMUSCULAR | Status: AC
  Administered 2021-08-24: 300 ug via INTRAMUSCULAR

## 2021-08-24 MED ORDER — CIPRO HC 0.2-1 % OT SUSP: 3.0000 [drp] | Freq: Two times a day (BID) | OTIC | 0 refills | Status: DC

## 2021-08-24 NOTE — Progress Notes (Signed)
Pt presents for ROB visit. Pt concerned about weight loss during pregnancy. Pt states she has changed her eating habit she diagnosed with gestational diabetes. Pt states she is supposed to have weekly [place] but has only had one during her pregnancy.

## 2021-08-24 NOTE — Progress Notes (Signed)
Subjective:  Heather Savage is a 31 y.o. [phone] at [redacted]w[redacted]d being seen today for ongoing prenatal care.  She is currently monitored for the following issues for this high-risk pregnancy and has Hearing loss; Language barrier; Rh negative, maternal; History of gestational diabetes; S/P laparoscopic cholecystectomy; Supervision of normal pregnancy; and Gestational diabetes on their problem list.  Patient reports no complaints.  Contractions: Not present. Vag. Bleeding: None.  Movement: Present. Denies leaking of fluid.   The following portions of the patient's history were reviewed and updated as appropriate: allergies, current medications, past family history, past medical history, past social history, past surgical history and problem list. Problem list updated.  Objective:   Vitals:   08/24/21 1412  BP: 97/61  Pulse: (!) 108  Weight: 165 lb 8 oz (75.1 kg)    Fetal Status:     Movement: Present     General:  Alert, oriented and cooperative. Patient is in no acute distress.  Skin: Skin is warm and dry. No rash noted.   Cardiovascular: Normal heart rate noted  Respiratory: Normal respiratory effort, no problems with respiration noted  Abdomen: Soft, gravid, appropriate for gestational age. Pain/Pressure: Absent     Pelvic:  Cervical exam deferred        Extremities: Normal range of motion.  Edema: None  Mental Status: Normal mood and affect. Normal behavior. Normal judgment and thought content.   Urinalysis:      Assessment and Plan:  Pregnancy: [id number] at [redacted]w[redacted]d  1. Encounter for supervision of other normal pregnancy in third trimester Stable   2. Rh negative status during pregnancy in third trimester Rhogam today  3. Diet controlled gestational diabetes mellitus (GDM) in third trimester CBG's not in goal range Will have pt start Metfromin 500 mg bid Start weekly antenatal testing and serial growth scans  4. Chronic otitis externa of left ear, unspecified type  -  ciprofloxacin-hydrocortisone (CIPRO HC) OTIC suspension; Place 3 drops into the left ear 2 (two) times daily. Use for 14 days  Dispense: 10 mL; Refill: 0  5. Language barrier Video interrupter used during today's visit  Preterm labor symptoms and general obstetric precautions including but not limited to vaginal bleeding, contractions, leaking of fluid and fetal movement were reviewed in detail with the patient. Please refer to After Visit Summary for other counseling recommendations.  Return in about 2 weeks (around 09/07/2021) for OB visit, face to face, MD only.   Hermina Staggers, MD

## 2021-08-24 NOTE — Patient Instructions (Addendum)
Gestational Diabetes Mellitus, Self-Care When you have gestational diabetes mellitus, you must make sure your blood sugar (glucose) stays at a healthy level. What are the risks? If you do not get treated for this condition, it may cause problems for you and your unborn baby. For the mother Giving birth to the baby early. Having problems during labor and when giving birth. Needing surgery to give birth to the baby (cesarean delivery). Having problems with blood pressure. Getting this form of diabetes again when pregnant. Getting type 2 diabetes in the future. For the baby Low blood sugar. Bigger body size than is normal. Breathing problems. How to monitor blood sugar Check your blood sugar every day while you are pregnant. Check it as often as told by your doctor. To do this: Wash your hands with soap and water for at least 20 seconds. Prick the side of your finger (not the tip) with the lancet. Use a different finger each time. Gently rub the finger until a small drop of blood appears. Follow instructions that come with your meter for: Putting in the test strip. Putting blood on the strip. Getting the result. Write down your result and any notes. In general, your blood sugar levels should be: 95 mg/dL (5.3 mmol/L) if you have not eaten. 140 mg/dL (7.8 mmol/L) 1 hour after a meal. 120 mg/dL (6.7 mmol/L) 2 hours after a meal. Follow these instructions at home: Medicines Take over-the-counter and prescription medicines only as told by your doctor. If your doctor prescribed insulin or other diabetes medicines: Take them every day. Do not run out of insulin or other medicines. Plan ahead so you always have them. Eating and drinking  Follow instructions from your doctor about eating or drinking restrictions. See a food expert (dietician) to help you create an eating plan that helps control your blood sugar. The foods in this plan will include: Low-fat proteins. Dried beans, nuts,  and whole grain breads, cereals, or pasta. Fresh fruits and vegetables. Low-fat dairy products. Healthy fats. Eat healthy snacks between healthy meals. Drink enough fluid to keep your pee (urine) pale yellow. Keep track of carbs that you eat. To do this: Read food labels. Learn the serving sizes of foods. Follow your sick day plan when you cannot eat or drink normally. Make this plan with your doctor so it is ready to use. Activity Do exercises as told by your doctor. Exercise for 30 or more minutes a day, or as much as your doctor recommends. To help you control blood sugar levels after a meal: Do 10 minutes of exercise after each meal. Start this exercise 30 minutes after the meal. Talk with your doctor before you start a new exercise. Your doctor may tell you to change your insulin, other medicines, or food. Lifestyle Do not drink alcohol. Do not use any products that contain nicotine or tobacco, such as cigarettes, e-cigarettes, and chewing tobacco. If you need help quitting, ask your doctor. Learn how to deal with stress. If you need help with this, ask your doctor. Body care Stay up to date with your shots (vaccines). Take good care of your teeth. To do this: Brush your teeth and gums two times a day. Floss one or more times a day. Go to the dentist one or more times every 6 months. Stay at a healthy weight while you are pregnant. General instructions Ask your doctor about risks of high blood pressure in pregnancy. Share your diabetes care plan with: Your work or school. People  you live with. Check your pee for ketones: When you are sick. As told by your doctor. Carry a card or wear a bracelet that says you have diabetes. Keep all follow-up visits. Care after giving birth Have your blood sugar checked 4-12 weeks after you give birth. Get checked for diabetes one or more times every 3 years or as told. Where to find more information American Diabetes Association (ADA):  diabetes.org Association of Diabetes Care & Education Specialists (ADCES): diabeteseducator.org Centers for Disease Control and Prevention (CDC): TonerPromos.no American Pregnancy Association: americanpregnancy.org U.[place]. Department of Agriculture MyPlate: WrestlingReporter.dk Contact a doctor if: Your blood sugar is above your target for two tests in a row. You have a fever. You are sick for 2 days or more and do not get better. You have either of these problems for more than 6 hours: You vomit every time you eat or drink. You have watery poop (diarrhea). Get help right away if: You cannot think clearly. You have trouble breathing. You have moderate or high ketones in your pee. Blood or abnormal fluid starts to come out of your vagina. You feel your baby is not moving as usual. You start having early contractions. You may feel your belly tighten. You have a very bad headache. These symptoms may be an emergency. Get help right away. Call your local emergency services (911 in the [place] Do not wait to see if the symptoms will go away. Do not drive yourself to the hospital. Summary Check your blood sugar (glucose) while you are pregnant. Check it as often as told by your doctor. Take your insulin and diabetes medicines as told. Have your blood sugar checked 4-12 weeks after you give birth. Keep all follow-up visits. This information is not intended to replace advice given to you by your health care provider. Make sure you discuss any questions you have with your health care provider. Document Revised: 06/22/2019 Document Reviewed: 06/22/2019 Elsevier Patient Education  2023 [employer]. Third Trimester of Pregnancy  The third trimester of pregnancy is from week 28 through week 40. This is months 7 through 9. The third trimester is a time when the unborn baby (fetus) is growing rapidly. At the end of the ninth month, the fetus is about 20 inches long and weighs 6-10 pounds. Body changes during your  third trimester During the third trimester, your body will continue to go through many changes. The changes vary and generally return to normal after your baby is born. Physical changes Your weight will continue to increase. You can expect to gain 25-35 pounds (11-16 kg) by the end of the pregnancy if you begin pregnancy at a normal weight. If you are underweight, you can expect to gain 28-40 lb (about 13-18 kg), and if you are overweight, you can expect to gain 15-25 lb (about 7-11 kg). You may begin to get stretch marks on your hips, abdomen, and breasts. Your breasts will continue to grow and may hurt. A yellow fluid (colostrum) may leak from your breasts. This is the first milk you are producing for your baby. You may have changes in your hair. These can include thickening of your hair, rapid growth, and changes in texture. Some people also have hair loss during or after pregnancy, or hair that feels dry or thin. Your belly button may stick out. You may notice more swelling in your hands, face, or ankles. Health changes You may have heartburn. You may have constipation. You may develop hemorrhoids. You may develop swollen,  bulging veins (varicose veins) in your legs. You may have increased body aches in the pelvis, back, or thighs. This is due to weight gain and increased hormones that are relaxing your joints. You may have increased tingling or numbness in your hands, arms, and legs. The skin on your abdomen may also feel numb. You may feel short of breath because of your expanding uterus. Other changes You may urinate more often because the fetus is moving lower into your pelvis and pressing on your bladder. You may have more problems sleeping. This may be caused by the size of your abdomen, an increased need to urinate, and an increase in your body's metabolism. You may notice the fetus "dropping," or moving lower in your abdomen (lightening). You may have increased vaginal  discharge. You may notice that you have pain around your pelvic bone as your uterus distends. Follow these instructions at home: Medicines Follow your health care provider's instructions regarding medicine use. Specific medicines may be either safe or unsafe to take during pregnancy. Do not take any medicines unless approved by your health care provider. Take a prenatal vitamin that contains at least 600 micrograms (mcg) of folic acid. Eating and drinking Eat a healthy diet that includes fresh fruits and vegetables, whole grains, good sources of protein such as meat, eggs, or tofu, and low-fat dairy products. Avoid raw meat and unpasteurized juice, milk, and cheese. These carry germs that can harm you and your baby. Eat 4 or 5 small meals rather than 3 large meals a day. You may need to take these actions to prevent or treat constipation: Drink enough fluid to keep your urine pale yellow. Eat foods that are high in fiber, such as beans, whole grains, and fresh fruits and vegetables. Limit foods that are high in fat and processed sugars, such as fried or sweet foods. Activity Exercise only as directed by your health care provider. Most people can continue their usual exercise routine during pregnancy. Try to exercise for 30 minutes at least 5 days a week. Stop exercising if you experience contractions in the uterus. Stop exercising if you develop pain or cramping in the lower abdomen or lower back. Avoid heavy lifting. Do not exercise if it is very hot or humid or if you are at a high altitude. If you choose to, you may continue to have sex unless your health care provider tells you not to. Relieving pain and discomfort Take frequent breaks and rest with your legs raised (elevated) if you have leg cramps or low back pain. Take warm sitz baths to soothe any pain or discomfort caused by hemorrhoids. Use hemorrhoid cream if your health care provider approves. Wear a supportive bra to prevent  discomfort from breast tenderness. If you develop varicose veins: Wear support hose as told by your health care provider. Elevate your feet for 15 minutes, 3-4 times a day. Limit salt in your diet. Safety Talk to your health care provider before traveling far distances. Do not use hot tubs, steam rooms, or saunas. Wear your seat belt at all times when driving or riding in a car. Talk with your health care provider if someone is verbally or physically abusive to you. Preparing for birth To prepare for the arrival of your baby: Take prenatal classes to understand, practice, and ask questions about labor and delivery. Visit the hospital and tour the maternity area. Purchase a rear-facing car seat and make sure you know how to install it in your car. Prepare the  baby's room or sleeping area. Make sure to remove all pillows and stuffed animals from the baby's crib to prevent suffocation. General instructions Avoid cat litter boxes and soil used by cats. These carry germs that can cause birth defects in the baby. If you have a cat, ask someone to clean the litter box for you. Do not douche or use tampons. Do not use scented sanitary pads. Do not use any products that contain nicotine or tobacco, such as cigarettes, e-cigarettes, and chewing tobacco. If you need help quitting, ask your health care provider. Do not use any herbal remedies, illegal drugs, or medicines that were not prescribed to you. Chemicals in these products can harm your baby. Do not drink alcohol. You will have more frequent prenatal exams during the third trimester. During a routine prenatal visit, your health care provider will do a physical exam, perform tests, and discuss your overall health. Keep all follow-up visits. This is important. Where to find more information American Pregnancy Association: americanpregnancy.org [employer]st[employer]ians [employer] Gynecologists: https://www.todd-brady.net/ Office on  [employer] Health: MightyReward.co.nz Contact a health care provider if you have: A fever. Mild pelvic cramps, pelvic pressure, or nagging pain in your abdominal area or lower back. Vomiting or diarrhea. Bad-smelling vaginal discharge or foul-smelling urine. Pain when you urinate. A headache that does not go away when you take medicine. Visual changes or see spots in front of your eyes. Get help right away if: Your water breaks. You have regular contractions less than 5 minutes apart. You have spotting or bleeding from your vagina. You have severe abdominal pain. You have difficulty breathing. You have chest pain. You have fainting spells. You have not felt your baby move for the time period told by your health care provider. You have new or increased pain, swelling, or redness in an arm or leg. Summary The third trimester of pregnancy is from week 28 through week 40 (months 7 through 9). You may have more problems sleeping. This can be caused by the size of your abdomen, an increased need to urinate, and an increase in your body's metabolism. You will have more frequent prenatal exams during the third trimester. Keep all follow-up visits. This is important. This information is not intended to replace advice given to you by your health care provider. Make sure you discuss any questions you have with your health care provider. Document Revised: 06/24/2019 Document Reviewed: 04/30/2019 Elsevier Patient Education  2023 [employer].

## 2021-09-08 ENCOUNTER — Ambulatory Visit: Payer: Medicaid Other | Admitting: *Deleted

## 2021-09-08 ENCOUNTER — Ambulatory Visit: Payer: Medicaid Other | Attending: Obstetrics and Gynecology

## 2021-09-08 VITALS — BP 111/64 | HR 73

## 2021-09-08 DIAGNOSIS — O24419 Gestational diabetes mellitus in pregnancy, unspecified control: Secondary | ICD-10-CM | POA: Diagnosis not present

## 2021-09-08 DIAGNOSIS — Z6791 Unspecified blood type, Rh negative: Secondary | ICD-10-CM | POA: Diagnosis not present

## 2021-09-08 DIAGNOSIS — Z3A36 36 weeks gestation of pregnancy: Secondary | ICD-10-CM | POA: Diagnosis not present

## 2021-09-08 DIAGNOSIS — O26893 Other specified pregnancy related conditions, third trimester: Secondary | ICD-10-CM | POA: Diagnosis not present

## 2021-09-08 DIAGNOSIS — O09293 Supervision of pregnancy with other poor reproductive or obstetric history, third trimester: Secondary | ICD-10-CM | POA: Diagnosis not present

## 2021-09-08 DIAGNOSIS — O24410 Gestational diabetes mellitus in pregnancy, diet controlled: Secondary | ICD-10-CM

## 2021-09-08 DIAGNOSIS — Z3483 Encounter for supervision of other normal pregnancy, third trimester: Secondary | ICD-10-CM

## 2021-09-08 DIAGNOSIS — O360130 Maternal care for anti-D [Rh] antibodies, third trimester, not applicable or unspecified: Secondary | ICD-10-CM

## 2021-09-08 DIAGNOSIS — O358XX0 Maternal care for other (suspected) fetal abnormality and damage, not applicable or unspecified: Secondary | ICD-10-CM

## 2021-09-08 DIAGNOSIS — Z363 Encounter for antenatal screening for malformations: Secondary | ICD-10-CM | POA: Diagnosis not present

## 2021-09-11 ENCOUNTER — Other Ambulatory Visit (HOSPITAL_COMMUNITY)
Admission: RE | Admit: 2021-09-11 | Discharge: 2021-09-11 | Disposition: A | Discharge status: Home / Self Care | Payer: Medicaid Other | Source: Ambulatory Visit | Attending: Obstetrics and Gynecology | Admitting: Obstetrics and Gynecology

## 2021-09-11 ENCOUNTER — Ambulatory Visit (INDEPENDENT_AMBULATORY_CARE_PROVIDER_SITE_OTHER): Payer: Medicaid Other | Admitting: Obstetrics and Gynecology

## 2021-09-11 ENCOUNTER — Encounter: Payer: Self-pay | Admitting: Obstetrics and Gynecology

## 2021-09-11 VITALS — BP 104/66 | HR 79 | Wt 165.0 lb

## 2021-09-11 DIAGNOSIS — Z789 Other specified health status: Secondary | ICD-10-CM

## 2021-09-11 DIAGNOSIS — O26893 Other specified pregnancy related conditions, third trimester: Secondary | ICD-10-CM

## 2021-09-11 DIAGNOSIS — Z3483 Encounter for supervision of other normal pregnancy, third trimester: Secondary | ICD-10-CM

## 2021-09-11 DIAGNOSIS — Z3A37 37 weeks gestation of pregnancy: Secondary | ICD-10-CM

## 2021-09-11 DIAGNOSIS — Z6791 Unspecified blood type, Rh negative: Secondary | ICD-10-CM

## 2021-09-11 DIAGNOSIS — O24410 Gestational diabetes mellitus in pregnancy, diet controlled: Secondary | ICD-10-CM

## 2021-09-11 NOTE — Progress Notes (Signed)
   PRENATAL VISIT NOTE  Subjective:  Heather Savage is a 31 y.o. [phone] at [redacted]w[redacted]d being seen today for ongoing prenatal care.  She is currently monitored for the following issues for this high-risk pregnancy and has Hearing loss; Language barrier; Rh negative, maternal; History of gestational diabetes; S/P laparoscopic cholecystectomy; Supervision of normal pregnancy; and Gestational diabetes on their problem list.  Patient reports some dysuria.  Contractions: Not present. Vag. Bleeding: None.  Movement: Present. Denies leaking of fluid.   The following portions of the patient's history were reviewed and updated as appropriate: allergies, current medications, past family history, past medical history, past social history, past surgical history and problem list.   Objective:   Vitals:   09/11/21 1441  BP: 104/66  Pulse: 79  Weight: 165 lb (74.8 kg)    Fetal Status: Fetal Heart Rate (bpm): 140 Fundal Height: 38 cm Movement: Present  Presentation: Vertex  General:  Alert, oriented and cooperative. Patient is in no acute distress.  Skin: Skin is warm and dry. No rash noted.   Cardiovascular: Normal heart rate noted  Respiratory: Normal respiratory effort, no problems with respiration noted  Abdomen: Soft, gravid, appropriate for gestational age.  Pain/Pressure: Present     Pelvic: Cervical exam performed in the presence of a chaperone Dilation: 5 Effacement (%): 50 Station: -3  Extremities: Normal range of motion.  Edema: None  Mental Status: Normal mood and affect. Normal behavior. Normal judgment and thought content.   Assessment and Plan:  Pregnancy: [id number] at [redacted]w[redacted]d 1. Encounter for supervision of other normal pregnancy in third trimester Patient is doing well Vaginal cultures and urine culture collected  2. Gestational diabetes mellitus (GDM) in third trimester Currently on metformin 500 BID CBGs reviewed and great majority within range Discussed IOL at 39 weeks  3. Rh  negative status during pregnancy in third trimester S/p rhogam  4. Language barrier Interpreter used  Term labor symptoms and general obstetric precautions including but not limited to vaginal bleeding, contractions, leaking of fluid and fetal movement were reviewed in detail with the patient. Please refer to After Visit Summary for other counseling recommendations.   No follow-ups on file.  No future appointments.  Catalina Antigua, MD

## 2021-09-11 NOTE — Progress Notes (Signed)
Pt presents for ROB visit. Pt state she has had some yellow discharge since June when she was diagnosed with a UTI. Pt states she is still having burning with urination. No other concerns at this time.

## 2021-09-12 LAB — CERVICOVAGINAL ANCILLARY ONLY
Bacterial Vaginitis (gardnerella): POSITIVE — AB
Candida Glabrata: NEGATIVE
Candida Vaginitis: POSITIVE — AB
Chlamydia: NEGATIVE
Comment: NEGATIVE
Comment: NEGATIVE
Comment: NEGATIVE
Comment: NEGATIVE
Comment: NEGATIVE
Comment: NORMAL
Neisseria Gonorrhea: NEGATIVE
Trichomonas: NEGATIVE

## 2021-09-13 LAB — URINE CULTURE, OB REFLEX: Organism ID, Bacteria: NO GROWTH

## 2021-09-13 LAB — CULTURE, OB URINE

## 2021-09-13 MED ORDER — METRONIDAZOLE 500 MG PO TABS: 500.0000 mg | ORAL_TABLET | Freq: Two times a day (BID) | ORAL | 0 refills | Status: DC

## 2021-09-13 MED ORDER — TERCONAZOLE 0.8 % VA CREA: 1.0000 | TOPICAL_CREAM | Freq: Every day | VAGINAL | 0 refills | Status: DC

## 2021-09-13 NOTE — Addendum Note (Signed)
Addended by: Catalina Antigua on: 09/13/2021 10:48 AM   Modules accepted: Orders

## 2021-09-15 LAB — CULTURE, BETA STREP (GROUP B ONLY): Strep Gp B Culture: NEGATIVE

## 2021-09-18 ENCOUNTER — Encounter: Payer: Self-pay | Admitting: Obstetrics and Gynecology

## 2021-09-18 ENCOUNTER — Ambulatory Visit (INDEPENDENT_AMBULATORY_CARE_PROVIDER_SITE_OTHER): Payer: Medicaid Other | Admitting: Obstetrics and Gynecology

## 2021-09-18 VITALS — BP 110/66 | HR 84 | Wt 168.0 lb

## 2021-09-18 DIAGNOSIS — Z3483 Encounter for supervision of other normal pregnancy, third trimester: Secondary | ICD-10-CM

## 2021-09-18 DIAGNOSIS — O26893 Other specified pregnancy related conditions, third trimester: Secondary | ICD-10-CM

## 2021-09-18 DIAGNOSIS — Z6791 Unspecified blood type, Rh negative: Secondary | ICD-10-CM

## 2021-09-18 DIAGNOSIS — O24415 Gestational diabetes mellitus in pregnancy, controlled by oral hypoglycemic drugs: Secondary | ICD-10-CM

## 2021-09-18 DIAGNOSIS — Z789 Other specified health status: Secondary | ICD-10-CM

## 2021-09-18 NOTE — Progress Notes (Signed)
   PRENATAL VISIT NOTE  Subjective:  Heather Savage is a 31 y.o. [phone] at [redacted]w[redacted]d being seen today for ongoing prenatal care.  She is currently monitored for the following issues for this high-risk pregnancy and has Hearing loss; Language barrier; Rh negative, maternal; History of gestational diabetes; S/P laparoscopic cholecystectomy; Supervision of normal pregnancy; and Gestational diabetes on their problem list.  Patient reports no complaints.  Contractions: Not present. Vag. Bleeding: None.  Movement: Present. Denies leaking of fluid.   The following portions of the patient's history were reviewed and updated as appropriate: allergies, current medications, past family history, past medical history, past social history, past surgical history and problem list.   Objective:   Vitals:   09/18/21 1444  BP: 110/66  Pulse: 84  Weight: 168 lb (76.2 kg)    Fetal Status: Fetal Heart Rate (bpm): 140 Fundal Height: 38 cm Movement: Present     General:  Alert, oriented and cooperative. Patient is in no acute distress.  Skin: Skin is warm and dry. No rash noted.   Cardiovascular: Normal heart rate noted  Respiratory: Normal respiratory effort, no problems with respiration noted  Abdomen: Soft, gravid, appropriate for gestational age.  Pain/Pressure: Present     Pelvic: Cervical exam deferred        Extremities: Normal range of motion.  Edema: None  Mental Status: Normal mood and affect. Normal behavior. Normal judgment and thought content.   Assessment and Plan:  Pregnancy: [id number] at [redacted]w[redacted]d 1. Encounter for supervision of other normal pregnancy in third trimester Patient is doing well without complaints  2. Gestational diabetes mellitus (GDM) in third trimester controlled on oral hypoglycemic drug CBGs reviewed and all within range Continue metformin Patient scheduled for IOL at 39 weeks  3. Rh negative status during pregnancy in third trimester S/p rhogam  4. Language  barrier Interpreter used during the encounter  Term labor symptoms and general obstetric precautions including but not limited to vaginal bleeding, contractions, leaking of fluid and fetal movement were reviewed in detail with the patient. Please refer to After Visit Summary for other counseling recommendations.   Return in about 6 weeks (around 10/30/2021) for postpartum.  Future Appointments  Date Time Provider Department Center  09/23/2021  7:30 AM [facility] SCHED ROOM [facility] None    Catalina Antigua, MD

## 2021-09-18 NOTE — Progress Notes (Signed)
Pt reports fetal movement with some pressure. Advised of results and medications at the pharmacy from visit on 8/14.

## 2021-09-20 ENCOUNTER — Telehealth (HOSPITAL_COMMUNITY): Payer: Self-pay | Admitting: *Deleted

## 2021-09-20 ENCOUNTER — Other Ambulatory Visit: Payer: Self-pay | Admitting: Advanced Practice Midwife

## 2021-09-20 NOTE — Telephone Encounter (Signed)
Preadmission screen  [phone]

## 2021-09-22 ENCOUNTER — Telehealth (HOSPITAL_COMMUNITY): Payer: Self-pay | Admitting: *Deleted

## 2021-09-22 NOTE — Telephone Encounter (Signed)
Preadmission screen interpreter number [phone]

## 2021-09-23 ENCOUNTER — Encounter (HOSPITAL_COMMUNITY): Payer: Self-pay | Admitting: Obstetrics and Gynecology

## 2021-09-23 ENCOUNTER — Inpatient Hospital Stay (HOSPITAL_COMMUNITY)
Admission: AD | Admit: 2021-09-23 | Discharge: 2021-09-25 | DRG: 807 | Disposition: A | Discharge status: Home / Self Care | Payer: Medicaid Other | Attending: Obstetrics and Gynecology | Admitting: Obstetrics and Gynecology

## 2021-09-23 ENCOUNTER — Inpatient Hospital Stay (HOSPITAL_COMMUNITY): Payer: Medicaid Other

## 2021-09-23 ENCOUNTER — Other Ambulatory Visit: Payer: Self-pay

## 2021-09-23 DIAGNOSIS — Z3A39 39 weeks gestation of pregnancy: Secondary | ICD-10-CM | POA: Diagnosis not present

## 2021-09-23 DIAGNOSIS — Z6791 Unspecified blood type, Rh negative: Secondary | ICD-10-CM

## 2021-09-23 DIAGNOSIS — O24424 Gestational diabetes mellitus in childbirth, insulin controlled: Secondary | ICD-10-CM | POA: Diagnosis not present

## 2021-09-23 DIAGNOSIS — O3663X0 Maternal care for excessive fetal growth, third trimester, not applicable or unspecified: Secondary | ICD-10-CM | POA: Diagnosis not present

## 2021-09-23 DIAGNOSIS — Z9049 Acquired absence of other specified parts of digestive tract: Secondary | ICD-10-CM | POA: Diagnosis not present

## 2021-09-23 DIAGNOSIS — O24425 Gestational diabetes mellitus in childbirth, controlled by oral hypoglycemic drugs: Secondary | ICD-10-CM | POA: Diagnosis not present

## 2021-09-23 DIAGNOSIS — Z3490 Encounter for supervision of normal pregnancy, unspecified, unspecified trimester: Principal | ICD-10-CM | POA: Diagnosis present

## 2021-09-23 DIAGNOSIS — O26893 Other specified pregnancy related conditions, third trimester: Secondary | ICD-10-CM | POA: Diagnosis not present

## 2021-09-23 DIAGNOSIS — Z3483 Encounter for supervision of other normal pregnancy, third trimester: Secondary | ICD-10-CM

## 2021-09-23 LAB — CBC
HCT: 35 % — ABNORMAL LOW (ref 36.0–46.0)
Hemoglobin: 12.6 g/dL (ref 12.0–15.0)
MCH: 33.6 pg (ref 26.0–34.0)
MCHC: 36 g/dL (ref 30.0–36.0)
MCV: 93.3 fL (ref 80.0–100.0)
Platelets: 171 10*3/uL (ref 150–400)
RBC: 3.75 MIL/uL — ABNORMAL LOW (ref 3.87–5.11)
RDW: 13 % (ref 11.5–15.5)
WBC: 5.7 10*3/uL (ref 4.0–10.5)
nRBC: 0 % (ref 0.0–0.2)

## 2021-09-23 LAB — GLUCOSE, CAPILLARY
Glucose-Capillary: 105 mg/dL — ABNORMAL HIGH (ref 70–99)
Glucose-Capillary: 89 mg/dL (ref 70–99)

## 2021-09-23 LAB — TYPE AND SCREEN
ABO/RH(D): O NEG
Antibody Screen: POSITIVE

## 2021-09-23 LAB — RPR: RPR Ser Ql: NONREACTIVE

## 2021-09-23 MED ORDER — SENNOSIDES-DOCUSATE SODIUM 8.6-50 MG PO TABS
2.0000 | ORAL_TABLET | Freq: Every day | ORAL | Status: DC
  Administered 2021-09-24 – 2021-09-25 (×2): 2 via ORAL
  Filled 2021-09-23 (×2): qty 2

## 2021-09-23 MED ORDER — DIPHENHYDRAMINE HCL 25 MG PO CAPS: 25.0000 mg | ORAL_CAPSULE | Freq: Four times a day (QID) | ORAL | Status: DC | PRN

## 2021-09-23 MED ORDER — TETANUS-DIPHTH-ACELL PERTUSSIS 5-2.5-18.5 LF-MCG/0.5 IM SUSY: 0.5000 mL | PREFILLED_SYRINGE | Freq: Once | INTRAMUSCULAR | Status: DC

## 2021-09-23 MED ORDER — TERBUTALINE SULFATE 1 MG/ML IJ SOLN
0.2500 mg | Freq: Once | INTRAMUSCULAR | Status: DC | PRN
Start: 2021-09-23 — End: 2021-09-23

## 2021-09-23 MED ORDER — ZOLPIDEM TARTRATE 5 MG PO TABS: 5.0000 mg | ORAL_TABLET | Freq: Every evening | ORAL | Status: DC | PRN

## 2021-09-23 MED ORDER — COCONUT OIL OIL: 1.0000 | TOPICAL_OIL | Status: DC | PRN

## 2021-09-23 MED ORDER — WITCH HAZEL-GLYCERIN EX PADS: 1.0000 | MEDICATED_PAD | CUTANEOUS | Status: DC | PRN

## 2021-09-23 MED ORDER — SOD CITRATE-CITRIC ACID 500-334 MG/5ML PO SOLN: 30.0000 mL | ORAL | Status: DC | PRN

## 2021-09-23 MED ORDER — OXYTOCIN-SODIUM CHLORIDE 30-0.9 UT/500ML-% IV SOLN
2.5000 [IU]/h | INTRAVENOUS | Status: DC
  Filled 2021-09-23: qty 500

## 2021-09-23 MED ORDER — OXYTOCIN BOLUS FROM INFUSION
333.0000 mL | Freq: Once | INTRAVENOUS | Status: AC
Start: 2021-09-23 — End: 2021-09-23
  Administered 2021-09-23: 333 mL via INTRAVENOUS

## 2021-09-23 MED ORDER — ACETAMINOPHEN 325 MG PO TABS
650.0000 mg | ORAL_TABLET | ORAL | Status: DC | PRN
  Administered 2021-09-24: 650 mg via ORAL
  Filled 2021-09-23: qty 2

## 2021-09-23 MED ORDER — LACTATED RINGERS IV SOLN: 500.0000 mL | INTRAVENOUS | Status: DC | PRN

## 2021-09-23 MED ORDER — ONDANSETRON HCL 4 MG/2ML IJ SOLN
4.0000 mg | Freq: Four times a day (QID) | INTRAMUSCULAR | Status: DC | PRN
Start: 2021-09-23 — End: 2021-09-23
  Administered 2021-09-23: 4 mg via INTRAVENOUS
  Filled 2021-09-23: qty 2

## 2021-09-23 MED ORDER — ONDANSETRON HCL 4 MG PO TABS: 4.0000 mg | ORAL_TABLET | ORAL | Status: DC | PRN

## 2021-09-23 MED ORDER — OXYCODONE-ACETAMINOPHEN 5-325 MG PO TABS: 2.0000 | ORAL_TABLET | ORAL | Status: DC | PRN

## 2021-09-23 MED ORDER — LIDOCAINE HCL (PF) 1 % IJ SOLN
30.0000 mL | INTRAMUSCULAR | Status: DC | PRN
Start: 2021-09-23 — End: 2021-09-23

## 2021-09-23 MED ORDER — BENZOCAINE-MENTHOL 20-0.5 % EX AERO
1.0000 | INHALATION_SPRAY | CUTANEOUS | Status: DC | PRN
  Administered 2021-09-23: 1 via TOPICAL
  Filled 2021-09-23: qty 56

## 2021-09-23 MED ORDER — FENTANYL CITRATE (PF) 100 MCG/2ML IJ SOLN: 100.0000 ug | INTRAMUSCULAR | Status: DC | PRN

## 2021-09-23 MED ORDER — ACETAMINOPHEN 325 MG PO TABS: 650.0000 mg | ORAL_TABLET | ORAL | Status: DC | PRN

## 2021-09-23 MED ORDER — ONDANSETRON HCL 4 MG/2ML IJ SOLN: 4.0000 mg | INTRAMUSCULAR | Status: DC | PRN

## 2021-09-23 MED ORDER — PRENATAL MULTIVITAMIN CH
1.0000 | ORAL_TABLET | Freq: Every day | ORAL | Status: DC
  Administered 2021-09-24 – 2021-09-25 (×2): 1 via ORAL
  Filled 2021-09-23 (×2): qty 1

## 2021-09-23 MED ORDER — OXYTOCIN-SODIUM CHLORIDE 30-0.9 UT/500ML-% IV SOLN
1.0000 m[IU]/min | INTRAVENOUS | Status: DC
  Administered 2021-09-23: 2 m[IU]/min via INTRAVENOUS

## 2021-09-23 MED ORDER — OXYCODONE-ACETAMINOPHEN 5-325 MG PO TABS: 1.0000 | ORAL_TABLET | ORAL | Status: DC | PRN

## 2021-09-23 MED ORDER — LACTATED RINGERS IV SOLN: INTRAVENOUS | Status: DC

## 2021-09-23 MED ORDER — SIMETHICONE 80 MG PO CHEW: 80.0000 mg | CHEWABLE_TABLET | ORAL | Status: DC | PRN

## 2021-09-23 MED ORDER — IBUPROFEN 600 MG PO TABS
600.0000 mg | ORAL_TABLET | Freq: Four times a day (QID) | ORAL | Status: DC
  Administered 2021-09-24 – 2021-09-25 (×6): 600 mg via ORAL
  Filled 2021-09-23 (×7): qty 1

## 2021-09-23 MED ORDER — DIBUCAINE (PERIANAL) 1 % EX OINT: 1.0000 | TOPICAL_OINTMENT | CUTANEOUS | Status: DC | PRN

## 2021-09-23 NOTE — Progress Notes (Signed)
Heather Savage is a 31 y.o. [id number] at [redacted]w[redacted]d admitted for IOL for A2GDM  Subjective: Pt feeling intermittent pressure, unchanged since exam 20 minutes ago by RN.  Denies urge to push at this time.  Objective: BP 113/68   Pulse 85   Temp 98 F (36.7 C) (Oral)   Resp 18   Ht 5\' 3"  (1.6 m)   Wt 76.1 kg   LMP 12/24/2020   BMI 29.72 kg/m  No intake/output data recorded. Total I/O In: -  Out: 212 [Blood:212]  FHT:  FHR: 135 bpm, variability: moderate,  accelerations:  Abscent,  decelerations:  Absent UC:   regular, every 2 minutes SVE:   7/100/-1  Labs: Lab Results  Component Value Date   WBC 5.7 09/23/2021   HGB 12.6 09/23/2021   HCT 35.0 (L) 09/23/2021   MCV 93.3 09/23/2021   PLT 171 09/23/2021    Assessment / Plan: IOL for A2GDM, progressing well after Pitocin and AROM  Labor: Progressing normally Preeclampsia:   n/a Fetal Wellbeing:  Category I Pain Control:  IV pain meds I/D:   GBS negative  Anticipated MOD:  NSVD  09/25/2021, CNM 09/23/2021, 9:26 PM

## 2021-09-23 NOTE — H&P (Cosign Needed)
OBSTETRIC ADMISSION HISTORY AND PHYSICAL  Heather Savage is a 31 y.o. female [phone] with IUP at 67w0dby LMP presenting for scheduled IOL. She reports +FMs, No LOF, no VB, no blurry vision, headaches or peripheral edema, and RUQ pain.  She plans on breast feeding. She is undecided for birth control. [facility]     Dating: By LMP --->  Estimated Date of Delivery: 09/30/21  Sono:    _0 , CWD, normal anatomy, Cephalic presentation,  [id number] 96% EFW   Prenatal History/Complications:   Rh negative - s/p Rhogam 08/24/21. Gestational Diabetes - patient on metformin.  Past Medical History: Past Medical History:  Diagnosis Date   Anemia    states has low blood count, no current meds.   Hearing loss    right   History of gastritis    no current med.   History of gestational diabetes 07/16/2018   Normal postpartum GTT   History of malaria 2012   Preterm labor    Rh negative, maternal    Tympanic membrane perforation 05/2012   right    Past Surgical History: Past Surgical History:  Procedure Laterality Date   CHOLECYSTECTOMY N/A 09/27/2018   Procedure: LAPAROSCOPIC CHOLECYSTECTOMY WITH INTRAOPERATIVE CHOLANGIOGRAM;  Surgeon: WIleana Roup MD;  [facility]: [facility];  Service: General;  Laterality: N/A;   ERCP N/A 09/28/2018   Procedure: ENDOSCOPIC RETROGRADE CHOLANGIOPANCREATOGRAPHY (ERCP);  Surgeon: SLadene Artist MD;  [facility];  Service: Endoscopy;  Laterality: N/A;   EXTERNAL EAR SURGERY     REMOVAL OF STONES  09/28/2018   Procedure: REMOVAL OF STONES;  Surgeon: SLadene Artist MD;  [facility];  Service: Endoscopy;;   SCAR REVISION  08/27/2011   Procedure: SCAR REVISION;  Surgeon: GCristine Polio MD;  [place] [facility] [place]  Service: [place];  Laterality: N/A;  scar revision of forehead   SPHINCTEROTOMY  09/28/2018   Procedure: SPHINCTEROTOMY;  Surgeon: SLadene Artist MD;  [facility];  Service: Endoscopy;;   TYMPANOPLASTY  05/14/2011   Procedure: TYMPANOPLASTY;  Surgeon: JIzora Gala MD;  [place]  Service: E[place];  Laterality: Left;  TYMPANOPLASYT AND  POSSIBLE OSSICULARPLASTY    TYMPANOPLASTY  10/10/2011   Procedure: TYMPANOPLASTY;  Surgeon: JIzora Gala MD;  [place] [facility]  Service: E[place];  Laterality: Right;   TYMPANOPLASTY Right 06/30/2012   Procedure: REVISION RIGHT TYMPANOPLASTY;  Surgeon: JIzora Gala MD;  [place]  Service: E[place];  Laterality: Right;    Obstetrical History: OB History     Gravida  4   Para  2   Term  2   Preterm  0   AB  1   Living  2      SAB  1   IAB  0   Ectopic  0   Multiple  0   Live Births  2           Social History Social History   Socioeconomic History   Marital status: Single    Spouse name: Not on file   Number of children: 1   Years of education: Not on file   Highest education level: Not on file  Occupational History   Not on file  Tobacco Use   Smoking status: Never   Smokeless tobacco: Never  Vaping Use   Vaping Use: Never used  Substance and Sexual Activity   Alcohol use: Never   Drug use: Never  Sexual activity: Not Currently    Birth control/protection: None  Other Topics Concern   Not on file  Social History Narrative   Immigrated from [place] in March 15, 2011.  [place] family background.  Planning on becoming permanent resident.  Lives with boyfriend.  [place] living in [place]- no other family in [place].  [place]   Social Determinants of Health   Financial Resource Strain: Not on file  Food Insecurity: Not on file  Transportation Needs: Not on file  Physical Activity: Inactive (08/26/2018)   Exercise Vital Sign    Days of Exercise per Week: 0 days    Minutes of Exercise per Session: 0 min  Stress: No Stress Concern Present (08/26/2018)   [place]     Feeling of Stress : Not at all  Social Connections: Not on file    Family History: Family History  Problem Relation Age of Onset   Asthma Neg Hx    Cancer Neg Hx    Birth defects Neg Hx    Diabetes Neg Hx    Heart disease Neg Hx    Hypertension Neg Hx    Stroke Neg Hx     Allergies: Allergies  Allergen Reactions   Other     Dermabond - causes local skin reaction/rash and blister   Skin Adhesives [Cyanoacrylate] Dermatitis    Pt denies allergies to latex, iodine, or shellfish.  Medications Prior to Admission  Medication Sig Dispense Refill Last Dose   Accu-Chek Softclix Lancets lancets 1 each by Other route 4 (four) times daily. Use as instructed 100 each 12    Blood Glucose Monitoring Suppl (ACCU-CHEK GUIDE) w/Device KIT 1 kit by Does not apply route in the morning, at noon, in the evening, and at bedtime. 1 kit 0    Blood Pressure Monitoring (BLOOD PRESSURE KIT) DEVI 1 kit by Does not apply route once a week. 1 each 0    ciprofloxacin-hydrocortisone (CIPRO HC) OTIC suspension Place 3 drops into the left ear 2 (two) times daily. Use for 14 days 10 mL 0    famotidine (PEPCID) 40 MG tablet Take 1 tablet (40 mg total) by mouth daily. 30 tablet 5    glucose blood test strip Use as instructed 100 each 12    metFORMIN (GLUCOPHAGE) 500 MG tablet Take 1 tablet (500 mg total) by mouth at bedtime. 60 tablet 5    metroNIDAZOLE (FLAGYL) 500 MG tablet Take 1 tablet (500 mg total) by mouth 2 (two) times daily. (Patient not taking: Reported on 09/18/2021) 14 tablet 0    Misc. Devices (GOJJI WEIGHT SCALE) MISC 1 Device by Does not apply route every 30 (thirty) days. 1 each 0    Prenatal-DSS-FeCb-FeGl-FA (CITRANATAL BLOOM) 90-1 MG TABS Take 1 tablet by mouth daily. (Patient not taking: Reported on 09/11/2021) 30 tablet 11    terconazole (TERAZOL 3) 0.8 % vaginal cream Place 1 applicator vaginally at bedtime. Apply nightly for three nights. 20 g 0      Review of Systems   All systems  reviewed and negative except as stated in HPI  Height _0  (1.6 m), weight 76.1 kg, last menstrual period 12/24/2020. General appearance: alert, cooperative, and no distress Lungs: clear to auscultation bilaterally Heart: regular rate and rhythm Abdomen: soft, non-tender; bowel sounds normal Pelvic: adequate, 6/50/-2 Extremities: Homans sign is negative, no sign of DVT DTR's 2+ symmetric Presentation: cephalic Fetal monitoringBaseline: 130 bpm, Variability: Good {> 6 bpm), Accelerations: Reactive,  and Decelerations: Absent Uterine activityFrequency: Every 5 minutes     Prenatal labs: ABO, Rh: O/Negative/-- (03/15 1023) Antibody: Negative (03/15 1023) Rubella: 5.45 (03/15 1023) RPR: Non Reactive (06/07 0914)  HBsAg: Negative (03/15 1023)  HIV: Non Reactive (06/07 0914)  GBS: Negative/-- (08/14 1519)  1 hr Glucola: 186 Genetic screening:  Low risk Anatomy US: normal  Prenatal Transfer Tool  Maternal Diabetes: No Genetic Screening: Normal Maternal Ultrasounds/Referrals: LGA Fetal Ultrasounds or other Referrals:  Referred to Materal Fetal Medicine  Maternal Substance Abuse:  No Significant Maternal Medications:  metformin Significant Maternal Lab Results: Group B Strep negative  No results found for this or any previous visit (from the past 24 hour(s)).  Patient Active Problem List   Diagnosis Date Noted   Encounter for induction of labor 09/23/2021   Gestational diabetes 07/18/2021   Supervision of normal pregnancy 03/10/2021   S/P laparoscopic cholecystectomy 09/27/2018   History of gestational diabetes 07/16/2018   Rh negative, maternal 02/08/2016   Language barrier 01/02/2016   Hearing loss 05/01/2011    Assessment/Plan:  Heather Savage is a 32 y.o. [id number] at 79w0dhere for IOL due to GDMA2(metformin)  #Labor:augmentation with pitocin, titrate per protocol. AROM is agreeable later, declined on admission. #Pain: Epidural if desired, plans for unmedicated  delivery.  #FWB: Cat I #ID:  GBS negative #MOF: Breast #MOC: Undecided #Circ:  Yes, if boy  AJanice NorrieMD, PGY-3 [facility] 09/23/2021, 11:31 AM  Midwife attestation: I have seen and examined this patient; I agree with above documentation in the resident's note.   Heather FUDALAis a 31y.o. [phone] for IOL for A2GDM  PE: BP 106/74 (BP Location: Left Arm)   Pulse 80   Temp 98 F (36.7 C) (Oral)   Resp 17   Ht _0  (1.6 m)   Wt 167 lb 12.8 oz (76.1 kg)   LMP 12/24/2020   SpO2 99%   Breastfeeding Unknown   BMI 29.72 kg/m  Gen: calm comfortable, NAD Resp: normal effort, no distress Abd: gravid  ROS, labs, PMH reviewed  Plan: Admit to LD Labor: Start Pitocin, pt declines AROM on admission, consider at later time Fetal monitoring: Category I ID: GBS neg  LFatima Blank CNM  09/24/2021, 2:20 PM

## 2021-09-23 NOTE — Progress Notes (Signed)
Labor Progress Note Heather Savage is a 31 y.o. [id number] at [redacted]w[redacted]d presented for IOL 2/2 GDMA2 S: Patient is resting sleeping comfortably on 14 units of pitocin with contracions every 4-5 minutes. Lengthy discussion with FOB about AROM and the utility given limited progression with higher dose pitocin, and slow progress and increased risks of infection and PPH with long inductions. FOB agreeable to discuss further with patient once she wakes from her nap.   O:  BP (!) 97/54   Pulse 72   Temp 98.4 F (36.9 C) (Oral)   Resp 18   Ht 5\' 3"  (1.6 m)   Wt 76.1 kg   LMP 12/24/2020   BMI 29.72 kg/m  EFM: 145/moderate variability/accels present/no decels  CVE: Dilation: 6 Effacement (%): 50 Cervical Position: Posterior Station: -2 Presentation: Vertex Exam by:: Ikeem Cleckler, MD   A&P: 31 y.o. 38 [redacted]w[redacted]d admitted for IOL 2/2 GDMA2 #Labor: Progressing slowly despite 14 units of pitocin. Declined AROM for now will reassess in 2 hours. #Pain: None #FWB: Cat I #GBS negative   [redacted]w[redacted]d, MD [facility] 5:13 PM

## 2021-09-23 NOTE — Progress Notes (Signed)
Pt asking (via husband interpreting) whether she needs to continue medication for UTI. Spoke with Dr. Lanae Crumbly who states pt does not need to continue medication. Will update pt and husband.

## 2021-09-23 NOTE — Progress Notes (Signed)
OB PN:  S: Pt resting comfortably, no acute complaints  O: BP 99/64   Pulse 73   Temp 98.1 F (36.7 C) (Oral)   Resp 18   Ht 5\' 3"  (1.6 m)   Wt 76.1 kg   LMP 12/24/2020   BMI 29.72 kg/m   FHT: 140bpm, moderate variablity, + accels, occasional early decels Toco: q2-49min SVE: 6.5/70/-2, AROM clear fluid  A/P: 31 y.o. 38 @ [redacted]w[redacted]d for IOL due to GDMA2 1. FWB: Cat. I 2. Labor: continue Pit per protocol GBS: neg GDMA2- q2hr checks  [redacted]w[redacted]d, DO [phone] (cell) [phone] (office)

## 2021-09-23 NOTE — Lactation Note (Signed)
This note was copied from a baby's chart. Lactation Consultation Note  Patient Name: Heather Savage EHUDJ'S Date: 09/23/2021 Reason for consult: Initial assessment;Term Age:31 hours  Parent is Exp BF mom.  Denies questions or concerns.  Non-birthing parent interpreting for mom.   RN assessing baby when [facility] entered room.    Mom denies questions at this time.  LC encouraged 8-12 feeds in 24 hours, feed with cues, STS.   Resources sheet provided and BFSG and OP resource info. Shared.  Hand pump reviewed and demonstrated for parent.     Maternal Data Has patient been taught Hand Expression?: No (mom states she knows how and declines teaching) Does the patient have breastfeeding experience prior to this delivery?: Yes How long did the patient breastfeed?: 89 and 31 year old;   BF 53yr 3 months and 1 yr 6 months  (formula and breast)    Feeding Mother's Current Feeding Choice: Breast Milk and Formula  LATCH Score Latch: Repeated attempts needed to sustain latch, nipple held in mouth throughout feeding, stimulation needed to elicit sucking reflex.  Audible Swallowing: A few with stimulation  Type of Nipple: Everted at rest and after stimulation  Comfort (Breast/Nipple): Soft / non-tender  Hold (Positioning): Assistance needed to correctly position infant at breast and maintain latch.  LATCH Score: 7   Lactation Tools Discussed/Used Tools: Pump Breast pump type: Manual (for home use/ mom does not have a pump)  Interventions Interventions: Breast feeding basics reviewed;Skin to skin;LC Services brochure;Education  Discharge Pump: Manual  Consult Status Consult Status: Follow-up Date: 09/24/21 Follow-up type: In-patient    Maryruth Hancock [facility] 09/23/2021, 10:59 PM

## 2021-09-24 LAB — GLUCOSE, CAPILLARY: Glucose-Capillary: 85 mg/dL (ref 70–99)

## 2021-09-24 NOTE — Progress Notes (Signed)
Post Partum Day 1  Subjective: no complaints, up ad lib, voiding, tolerating PO, and + flatus  Objective: Blood pressure 103/75, pulse 80, temperature 98.1 F (36.7 C), temperature source Oral, resp. rate 16, height 5\' 3"  (1.6 m), weight 76.1 kg, last menstrual period 12/24/2020, SpO2 99 %, unknown if currently breastfeeding.  Physical Exam:  General: alert, cooperative, and no distress Lochia: appropriate Uterine Fundus: firm Incision: n/a DVT Evaluation: No evidence of DVT seen on physical exam.  Recent Labs    09/23/21 1128  HGB 12.6  HCT 35.0*    Assessment/Plan: Plan for discharge tomorrow, Breastfeeding, and Contraception undecided   LOS: 1 day   09/25/21, CNM 09/24/2021, 11:37 AM

## 2021-09-25 LAB — GLUCOSE, CAPILLARY: Glucose-Capillary: 128 mg/dL — ABNORMAL HIGH (ref 70–99)

## 2021-09-25 MED ORDER — FAMOTIDINE 20 MG PO TABS
20.0000 mg | ORAL_TABLET | Freq: Every day | ORAL | Status: DC
  Administered 2021-09-25: 20 mg via ORAL
  Filled 2021-09-25: qty 1

## 2021-09-25 NOTE — Lactation Note (Signed)
This note was copied from a baby's chart. Lactation Consultation Note  Patient Name: Heather Savage [id number] Date: 09/25/2021 Reason for consult: Follow-up assessment;Term Age:31 hours   LC Note:  Support person interpreting for birth parent.  Pediatrician requested a latch observation.  Baby "[name]" was swaddled and asleep in birth parent's arms when I arrived.  She had recently breast fed for 20 minutes.  Informed parents that pediatrician requested a latch observation and offered to assist.  Baby was not showing cues, however, parents allowed me to awaken her in anticipation of being discharged.  Awakened "[name]" and latched, however, she was too sleepy to initiate a suck.  Placed her STS on birth parent's chest and she fell asleep.  Informed them to call me for the next feeding.  Updated pediatrician.   Maternal Data    Feeding Mother's Current Feeding Choice: Breast Milk and Formula  LATCH Score                    Lactation Tools Discussed/Used    Interventions    Discharge Discharge Education: Engorgement and breast care Pump: Manual  Consult Status Consult Status: Complete Date: 09/25/21 Follow-up type: Call as needed    Mozel Burdett R Rena Hunke 09/25/2021, 11:32 AM

## 2021-09-25 NOTE — Lactation Note (Signed)
This note was copied from a baby's chart. Lactation Consultation Note  Patient Name: Heather Savage EAVWU'J Date: 09/25/2021 Reason for consult: Follow-up assessment Age:31 hours   LC Note:  Follow Up Consult for latch observation:  Support person called as requested.  Baby "Heather Savage" was not showing feeding cues, however, family is anxious to be discharged.  They are aware that a latch observation needs to be completed prior to discharge.  Assisted to latch "Heather Savage."  She was able to easily latch with a wide gape and flanged lips.  Reinforced breast support, feeding STS and gentle stimulation to keep her actively engaged with her feeding. Even though she remained sleepy I observed a 10 minute feed.  Parents will need to feed every three to four hours without any long intervals during the day/night.  Family has our op phone number for any concerns.  Updated pediatrician and discharge order will be placed.   Maternal Data    Feeding Mother's Current Feeding Choice: Breast Milk and Formula  LATCH Score Latch: Repeated attempts needed to sustain latch, nipple held in mouth throughout feeding, stimulation needed to elicit sucking reflex.  Audible Swallowing: A few with stimulation  Type of Nipple: Everted at rest and after stimulation  Comfort (Breast/Nipple): Soft / non-tender  Hold (Positioning): Assistance needed to correctly position infant at breast and maintain latch.  LATCH Score: 7   Lactation Tools Discussed/Used    Interventions    Discharge Discharge Education: Engorgement and breast care Pump: Manual  Consult Status Consult Status: Complete Date: 09/25/21 Follow-up type: Call as needed    Geza Beranek R Garrie Elenes 09/25/2021, 12:20 PM

## 2021-09-25 NOTE — Lactation Note (Signed)
This note was copied from a baby's chart. Lactation Consultation Note  Patient Name: Heather Savage [id number] Date: 09/25/2021 Reason for consult: Follow-up assessment;Term Age:31 hours   P3: Term infant at 39+0 weeks Feeding preference: Breast/formula  Support person prefers to interpret for birth parent.  Birth parent breast fed her other two children for > 1 year each.  She had no questions/concerns related to breast feeding.  She is choosing to supplement with formula as needed.  Family has our op phone number for any questions after discharge.    Maternal Data    Feeding Mother's Current Feeding Choice: Breast Milk and Formula  LATCH Score                    Lactation Tools Discussed/Used    Interventions    Discharge Discharge Education: Engorgement and breast care Pump: Manual  Consult Status Consult Status: Complete Date: 09/25/21 Follow-up type: Call as needed    Priseis Cratty R Marijean Montanye 09/25/2021, 10:13 AM

## 2021-09-25 NOTE — Discharge Summary (Addendum)
Postpartum Discharge Summary  Date of Service updated     Patient Name: Heather Savage DOB: Feb 09, 1990 MRN: [id number]  Date of admission: 09/23/2021 Delivery date:09/23/2021  Delivering provider: Fatima Blank A  Date of discharge: 09/25/2021  Admitting diagnosis: Encounter for induction of labor [Z34.90] Intrauterine pregnancy: [redacted]w[redacted]d    Secondary diagnosis:  Principal Problem:   Encounter for induction of labor  Additional problems: None    Discharge diagnosis: Term Pregnancy Delivered                                              Post partum procedures: NA Augmentation: AROM and Pitocin Complications: None  Hospital course: Induction of Labor With Vaginal Delivery   31y.o. yo [id number] 31w0das admitted to the hospital 09/23/2021 for induction of labor.  Indication for induction: A2 DM.  Patient had an uncomplicated labor course as follows: Membrane Rupture Time/Date: 7:11 PM ,09/23/2021   Delivery Method:Vaginal, Spontaneous  Episiotomy: None  Lacerations:  Periurethral  Details of delivery can be found in separate delivery note.  Patient had a routine postpartum course. Patient is discharged home 09/25/21.  Newborn Data: Birth date:09/23/2021  Birth time:8:13 PM  Gender:Female  Living status:Living  Apgars:9 ,9  Weight:3544 g   Magnesium Sulfate received: No BMZ received: No Rhophylac:N/A-infant is also 0 Neg MMR:No T-DaP: needs PP Flu: declined Transfusion:No  Physical exam  Vitals:   09/24/21 1218 09/24/21 1551 09/24/21 2115 09/25/21 0541  BP: 106/74 101/65 102/69 109/70  Pulse: 80 87 79 (!) 51  Resp: 17 16 16 16   Temp: 98 F (36.7 C) 98 F (36.7 C)  98 F (36.7 C)  TempSrc: Oral Oral  Oral  SpO2:   99% 99%  Weight:      Height:       General: alert, cooperative, and no distress Lochia: appropriate Uterine Fundus: firm Incision: N/A DVT Evaluation: No evidence of DVT seen on physical exam. Labs: Lab Results  Component Value  Date   WBC 5.7 09/23/2021   HGB 12.6 09/23/2021   HCT 35.0 (L) 09/23/2021   MCV 93.3 09/23/2021   PLT 171 09/23/2021      Latest Ref Rng & Units 02/13/2021    4:37 PM  CMP  Glucose 70 - 99 mg/dL 154   BUN 6 - 20 mg/dL 11   Creatinine 0.44 - 1.00 mg/dL 0.58   Sodium 135 - 145 mmol/L 134   Potassium 3.5 - 5.1 mmol/L 3.6   Chloride 98 - 111 mmol/L 105   CO2 22 - 32 mmol/L 21   Calcium 8.9 - 10.3 mg/dL 9.0       After visit meds:  Allergies as of 09/25/2021       Reactions   Other    Dermabond - causes local skin reaction/rash and blister   Skin Adhesives [cyanoacrylate] Dermatitis        Medication List     STOP taking these medications    Accu-Chek Guide w/Device Kit   Accu-Chek Softclix Lancets lancets   Blood Pressure Kit Devi   Cipro HC OTIC suspension Generic drug: ciprofloxacin-hydrocortisone   CitraNatal Bloom 90-1 MG Tabs   famotidine 40 MG tablet Commonly known as: Pepcid   glucose blood test strip   Gojji Weight Scale Misc   metFORMIN 500 MG tablet Commonly known as: GLUCOPHAGE  metroNIDAZOLE 500 MG tablet Commonly known as: Flagyl   terconazole 0.8 % vaginal cream Commonly known as: TERAZOL 3         Discharge home in stable condition Infant Feeding: Bottle Infant Disposition:home with mother Discharge instruction: per After Visit Summary and Postpartum booklet. Activity: Advance as tolerated. Pelvic rest for 6 weeks.  Diet: routine diet Future Appointments:No future appointments. Follow up Visit:  Message sent to [place] on 09/23/21:  Please schedule this patient for a In person postpartum visit in 6 weeks with the following provider: Any provider. Additional Postpartum F/U:2 hour GTT  High risk pregnancy complicated by: GDM Delivery mode:  Vaginal, Spontaneous  Anticipated Birth Control:  Unsure   09/25/2021 [place]arr [place], CNM

## 2021-09-25 NOTE — Progress Notes (Signed)
BS this am was 128. Patient stated she ate some "white bread" at approximately 4 am.

## 2021-09-26 ENCOUNTER — Other Ambulatory Visit: Payer: Self-pay

## 2021-09-26 MED ORDER — VITAFOL ULTRA 29-0.6-0.4-200 MG PO CAPS: 1.0000 | ORAL_CAPSULE | Freq: Every day | ORAL | 11 refills | Status: DC

## 2021-09-26 NOTE — Progress Notes (Signed)
Patient called and husband called requesting rx for PNV, sent to pharmacy

## 2021-09-30 ENCOUNTER — Telehealth (HOSPITAL_COMMUNITY): Payer: Self-pay

## 2021-09-30 NOTE — Telephone Encounter (Signed)
Patient reports doing ok. "I am unable to control my urine sometimes." RN talked with patient about kegal exercises. RN also told patient to call her OB-GYN about her concern. Patient declines any other questions/concerns about her health and healing.  Patient reports that baby is doing well. Eating, peeing/pooping, and gaining weight well. Baby sleeps in a crib. RN reviewed ABC's of safe sleep with patient. Patient declines any questions or concerns about baby.  EPDS score is 0.  Marcelino Duster [facility]  09/30/21,1238

## 2021-11-07 ENCOUNTER — Encounter: Payer: Self-pay | Admitting: Advanced Practice Midwife

## 2021-11-07 ENCOUNTER — Ambulatory Visit (INDEPENDENT_AMBULATORY_CARE_PROVIDER_SITE_OTHER): Payer: Medicaid Other | Admitting: Advanced Practice Midwife

## 2021-11-07 ENCOUNTER — Other Ambulatory Visit: Payer: Medicaid Other

## 2021-11-07 DIAGNOSIS — R3915 Urgency of urination: Secondary | ICD-10-CM

## 2021-11-07 DIAGNOSIS — R87610 Atypical squamous cells of undetermined significance on cytologic smear of cervix (ASC-US): Secondary | ICD-10-CM

## 2021-11-07 DIAGNOSIS — M546 Pain in thoracic spine: Secondary | ICD-10-CM

## 2021-11-07 DIAGNOSIS — O3483 Maternal care for other abnormalities of pelvic organs, third trimester: Secondary | ICD-10-CM

## 2021-11-07 DIAGNOSIS — M6289 Other specified disorders of muscle: Secondary | ICD-10-CM

## 2021-11-07 DIAGNOSIS — O3480 Maternal care for other abnormalities of pelvic organs, unspecified trimester: Secondary | ICD-10-CM

## 2021-11-07 LAB — POCT URINALYSIS DIPSTICK
Bilirubin, UA: NEGATIVE
Glucose, UA: NEGATIVE
Ketones, UA: NEGATIVE
Nitrite, UA: NEGATIVE
Protein, UA: NEGATIVE
Spec Grav, UA: 1.02 (ref 1.010–1.025)
Urobilinogen, UA: 0.2 E.U./dL
pH, UA: 6 (ref 5.0–8.0)

## 2021-11-07 LAB — POCT URINE PREGNANCY: Preg Test, Ur: NEGATIVE

## 2021-11-07 NOTE — Progress Notes (Signed)
[facility] Part[facility] Visit Note  Heather Savage is a 31 y.o. [phone] female who presents for a postpartum visit. She is 6 weeks postpartum following a normal spontaneous vaginal delivery.  I have fully reviewed the prenatal and intrapartum course. The delivery was at 28 gestational weeks.  Anesthesia: none. Postpartum course has been going well, no complaints. Patient is complaining of burning upon urination and odor. Baby is doing well. Baby is feeding by breast. Bleeding no bleeding. Bowel function is normal. Bladder function is normal. Patient is not sexually active. Contraception method is none. Postpartum depression screening: negative.   The pregnancy intention screening data noted above was reviewed. Potential methods of contraception were discussed. The patient elected to proceed with No data recorded.   Edinburgh Postnatal Depression Scale - 11/07/21 0827       Edinburgh Postnatal Depression Scale:  In the Past 7 Days   I have been able to laugh and see the funny side of things. 0    I have looked forward with enjoyment to things. 0    I have blamed myself unnecessarily when things went wrong. 0    I have been anxious or worried for no good reason. 0    I have felt scared or panicky for no good reason. 0    Things have been getting on top of me. 0    I have been so unhappy that I have had difficulty sleeping. 0    I have felt sad or miserable. 0    I have been so unhappy that I have been crying. 0    The thought of harming myself has occurred to me. 0    Edinburgh Postnatal Depression Scale Total 0               Health Maintenance Due  Topic Date Due   INFLUENZA VACCINE  08/29/2021    The following portions of the patient's history were reviewed and updated as appropriate: allergies, current medications, past family history, past medical history, past social history, past surgical history, and problem list.  Review of Systems Pertinent items noted in HPI and  remainder of comprehensive ROS otherwise negative.  Objective:  LMP 12/24/2020   BP 114/79   Pulse 67   Wt 162 lb 3.2 oz (73.6 kg)   LMP 12/24/2020   Breastfeeding Yes   BMI 28.73 kg/m   VS reviewed, nursing note reviewed,  Constitutional: well developed, well nourished, no distress HEENT: normocephalic CV: normal rate Pulm/chest wall: normal effort Breast Exam:  deferred Abdomen: soft Neuro: alert and oriented x 3 Skin: warm, dry Psych: affect normal Pelvic exam: On visual inspection, pt with mild cystocele with Valsalva, does not reach introitus.    Assessment:   1. Postpartum care and examination --Doing well, bonding with baby, good support at home  - Glucose tolerance, 2 hours - POCT urine pregnancy - POCT urinalysis dipstick - Urine Culture  2. Pelvic floor dysfunction in female --Pt feeling like something is pushing down in the vagina when sitting on the toilet, sometimes when just standing up.  --Mild cystocele noted on pelvic exam today, will refer for pelvic floor therapy  - Ambulatory referral to Physical Therapy  3. Acute bilateral thoracic back pain --Rest/ice/heat/warm bath/increase PO fluids/Tylenol/ibuprofen - Ambulatory referral to Physical Therapy  4. Urinary urgency  - Ambulatory referral to Physical Therapy  5. Cystocele affecting postpartum period    Plan:   Essential components of care per ACOG recommendations:  1.  Mood and well being: Patient with negative depression screening today. Reviewed local resources for support.  - Patient tobacco use? No.   - hx of drug use? No.    2. Infant care and feeding:  -Patient currently breastmilk feeding? Yes. Reviewed importance of draining breast regularly to support lactation.  -Social determinants of health (SDOH) reviewed in EPIC. No concerns  3. Sexuality, contraception and birth spacing - Patient does not want a pregnancy in the next year.   - Reviewed reproductive life planning.  Reviewed contraceptive methods based on pt preferences and effectiveness.  Patient desired FAM or LAM today.     4. Sleep and fatigue -Encouraged family/partner/community support of 4 hrs of uninterrupted sleep to help with mood and fatigue  5. Physical Recovery  - Discussed patients delivery and complications. She describes her labor as mixed. Her labor with induction went well, but she delivered quickly with RN as midwife entered the room. She asks questions today about the shape of her baby's head and whether the rapid delivery could affect that.  Pt to follow up with pediatrician regarding infant, but birth was gentle, witnessed by CNM, and unlikely to affect infant head shape or other features. - Patient had a Vaginal, no problems at delivery. Patient had a  tiny periurethral  laceration that did not require repair. Perineal healing reviewed. Patient expressed understanding - Patient has urinary incontinence? Yes. Discussed role of pelvic floor PT.  See referral above. - Patient is safe to resume physical and sexual activity  6.  Health Maintenance - HM due items addressed Yes - Last pap smear  Diagnosis  Date Value Ref Range Status  04/12/2021 (A)  Final   - Atypical squamous cells of undetermined significance (ASC-US)   Pap smear done at today's visit.  Pap in 3 years with ASCUS, neg hpv. -Breast Cancer screening indicated? No.   7. Chronic Disease/Pregnancy Condition follow up: None  - PCP follow up  Jasper Riling, [place] for [employer], [place]

## 2021-11-08 LAB — GLUCOSE TOLERANCE, 2 HOURS
Glucose, 2 hour: 156 mg/dL — ABNORMAL HIGH (ref 70–139)
Glucose, GTT - Fasting: 109 mg/dL — ABNORMAL HIGH (ref 70–99)

## 2021-11-09 LAB — URINE CULTURE: Organism ID, Bacteria: NO GROWTH

## 2021-11-12 DIAGNOSIS — R87610 Atypical squamous cells of undetermined significance on cytologic smear of cervix (ASC-US): Secondary | ICD-10-CM | POA: Insufficient documentation

## 2021-11-16 ENCOUNTER — Other Ambulatory Visit: Payer: Self-pay

## 2021-11-16 DIAGNOSIS — Z758 Other problems related to medical facilities and other health care: Secondary | ICD-10-CM

## 2021-11-27 ENCOUNTER — Encounter (INDEPENDENT_AMBULATORY_CARE_PROVIDER_SITE_OTHER): Payer: Self-pay | Admitting: Primary Care

## 2021-11-28 NOTE — Therapy (Addendum)
OUTPATIENT PHYSICAL THERAPY FEMALE PELVIC EVALUATION   Patient Name: Heather Savage MRN: [id number] DOB:1990/11/14, 31 y.o., female Today's Date: 11/29/2021   PT End of Session - 11/29/21 1155     Visit Number 1    Date for PT Re-Evaluation 02/21/22    Authorization Type [employer]    PT Start Time 1147    PT Stop Time 1230    PT Time Calculation (min) 43 min    Activity Tolerance Patient tolerated treatment well    Behavior During Therapy WFL for tasks assessed/performed             Past Medical History:  Diagnosis Date   Anemia    states has low blood count, no current meds.   Hearing loss    right   History of gastritis    no current med.   History of gestational diabetes 07/16/2018   Normal postpartum GTT   History of malaria 2012   Preterm labor    Rh negative, maternal    Tympanic membrane perforation 05/2012   right   Past Surgical History:  Procedure Laterality Date   CHOLECYSTECTOMY N/A 09/27/2018   Procedure: LAPAROSCOPIC CHOLECYSTECTOMY WITH INTRAOPERATIVE CHOLANGIOGRAM;  Surgeon: Ileana Roup, MD;  Location: [facility];  Service: General;  Laterality: N/A;   ERCP N/A 09/28/2018   Procedure: ENDOSCOPIC RETROGRADE CHOLANGIOPANCREATOGRAPHY (ERCP);  Surgeon: Ladene Artist, MD;  Location: [facility];  Service: Endoscopy;  Laterality: N/A;   EXTERNAL EAR SURGERY     REMOVAL OF STONES  09/28/2018   Procedure: REMOVAL OF STONES;  Surgeon: Ladene Artist, MD;  Location: [facility];  Service: Endoscopy;;   SCAR REVISION  08/27/2011   Procedure: SCAR REVISION;  Surgeon: Cristine Polio, MD;  Location: [facility];  Service: Plastics;  Laterality: N/A;  scar revision of forehead   SPHINCTEROTOMY  09/28/2018   Procedure: SPHINCTEROTOMY;  Surgeon: Ladene Artist, MD;  Location: [facility];  Service: Endoscopy;;   TYMPANOPLASTY  05/14/2011   Procedure: TYMPANOPLASTY;  Surgeon: Izora Gala, MD;  Location: [facility];   Service: ENT;  Laterality: Left;  TYMPANOPLASYT AND  POSSIBLE OSSICULARPLASTY    TYMPANOPLASTY  10/10/2011   Procedure: TYMPANOPLASTY;  Surgeon: Izora Gala, MD;  Location: [facility];  Service: ENT;  Laterality: Right;   TYMPANOPLASTY Right 06/30/2012   Procedure: REVISION RIGHT TYMPANOPLASTY;  Surgeon: Izora Gala, MD;  Location: [facility];  Service: ENT;  Laterality: Right;   Patient Active Problem List   Diagnosis Date Noted   ASCUS of cervix with negative high risk HPV 11/12/2021   Encounter for induction of labor 09/23/2021   Gestational diabetes 07/18/2021   Supervision of normal pregnancy 03/10/2021   S/P laparoscopic cholecystectomy 09/27/2018   History of gestational diabetes 07/16/2018   Rh negative, maternal 02/08/2016   Language barrier 01/02/2016   Hearing loss 05/01/2011    PCP: None  REFERRING PROVIDER: Elvera Maria, CNM  REFERRING DIAG:  M62.89 (ICD-10-CM) - Pelvic floor dysfunction in female  M54.6 (ICD-10-CM) - Acute bilateral thoracic back pain  R39.15 (ICD-10-CM) - Urinary urgency    THERAPY DIAG:  Abnormal posture  Muscle weakness (generalized)  Other muscle spasm  Unspecified lack of coordination  Rationale for Evaluation and Treatment: Rehabilitation  ONSET DATE: 9 month ago  SUBJECTIVE:  SUBJECTIVE STATEMENT: Pt states that she has been having low back pain that started during pregnancy. She has been referred because of prolapse.  Fluid intake: Yes: not assessed this session    PAIN:  Are you having pain? Yes NPRS scale: 8/10 Pain location: low back pain  Pain type: tight Pain description: intermittent   Aggravating factors: prolonged sitting, sleeping on back, bending forward, picking up baby Relieving factors: massage  PRECAUTIONS:  None  WEIGHT BEARING RESTRICTIONS: No  FALLS:  Has patient fallen in last 6 months? No  LIVING ENVIRONMENT: Lives with: lives with their family Lives in: House/apartment  OCCUPATION: does not work outside of the home  PLOF: Independent  PATIENT GOALS: to decrease pain  PERTINENT HISTORY:  G4P3 Sexual abuse: No  BOWEL MOVEMENT: Pain with bowel movement: No Type of bowel movement:Frequency 1x every 3 days  and Strain Yes (sometimes) Fully empty rectum: Yes: - Leakage: No Pads: No Fiber supplement: No  URINATION: Pain with urination: No Fully empty bladder: Yes: - Stream: Strong Urgency: Yes: cannot hold it Frequency: 1.5 hours Leakage: Urge to void, Walking to the bathroom, Coughing, Sneezing, and Laughing Pads: Yes: -  INTERCOURSE: Pain with intercourse:  has not returned to intercourse, but no history of pain   PREGNANCY: Vaginal deliveries 3 Tearing No C-section deliveries 0 Currently pregnant No  PROLAPSE: Report of vaginal bulging   OBJECTIVE:   11/28/21: PATIENT SURVEYS:   PFIQ-7 90  COGNITION: Overall cognitive status: Within functional limits for tasks assessed     SENSATION: Light touch: Appears intact Proprioception: Appears intact  MUSCLE LENGTH: decreased left hip flexor length, very limited bil piriformis/hamstrings   FUNCTIONAL TESTS:  Poor bed mobility that increases pain; pain with lying supine  GAIT: Comments: WNL  POSTURE: rounded shoulders, forward head, increased lumbar lordosis, increased thoracic kyphosis, and anterior pelvic tilt   LUMBARAROM/PROM:  A/PROM A/PROM  Eval (%)  Flexion 75, pain  Extension 50, pain  Right lateral flexion 75  Left lateral flexion 50, pain  Right rotation 50  Left rotation 50, pain   (Blank rows = not tested)   LOWER EXTREMITY MMT: grossly 3/5   PALPATION:   General  tenderness throughout lumbar paraspinals bil                External Perineal Exam NA                              Internal Pelvic Floor NA  Patient confirms identification and approves PT to assess internal pelvic floor and treatment Yes will perform in future treatment sessions  PELVIC MMT:   MMT eval  Vaginal   Internal Anal Sphincter   External Anal Sphincter   Puborectalis   Diastasis Recti   (Blank rows = not tested)        TONE: High tone in low back back  PROLAPSE: NA  TODAY'S TREATMENT:  DATE: 11/29/21  EVAL  Exercises: Stretches/mobility: Piriformis stretch 60 sec bil Hamstring stretch 60 sec bil Strengthening: Seated hip adduction 10 x 5 sec Seated hip abduction 5x red band Seated marching 10x  Check all possible CPT codes: 97110- Therapeutic Exercise    Check all conditions that are expected to impact treatment: Musculoskeletal disorders and Current pregnancy or recent postpartum   If treatment provided at initial evaluation, no treatment charged due to lack of authorization.        PATIENT EDUCATION:  Education details: education on condition and treatment Person educated: Patient Education method: Explanation, Demonstration, Tactile cues, Verbal cues, and Handouts Education comprehension: verbalized understanding  HOME EXERCISE PROGRAM: [id number]  ASSESSMENT:  CLINICAL IMPRESSION: Patient is a 31 y.o. female who was seen today for physical therapy evaluation and treatment for low back pain; secondary complaints are something bulging out of vagina and urinary urgency and incontinence. Exam findings limited to external evaluation today and notable for abnormal posture, reduced/painful lumbar A/ROM, significant bil hip weakness, decreased flexibility, impaired bed mobility, and tenderness throughout lumbar musculature and glutes; internal vaginal assessment discussed and pt agreeable. Signs and symptoms are most consistent with deep core  weakness and coordination (including pelvic floor). Initial treatment consisted of seated exercises and stretches (for improved comfort); once moving to seated position, pt able to comfortable perform exercises. She will benefit from skilled PT intervention in order to decrease pain, decrease urinary urgency/incontinence, begin and progress functional strengthening program, and improve QOL.   OBJECTIVE IMPAIRMENTS: decreased coordination, decreased endurance, decreased mobility, decreased ROM, decreased strength, impaired tone, postural dysfunction, and pain.   ACTIVITY LIMITATIONS: carrying, lifting, bending, sitting, standing, squatting, transfers, bed mobility, continence, and locomotion level  PARTICIPATION LIMITATIONS: community activity  PERSONAL FACTORS: 1 comorbidity: G4P3  are also affecting patient's functional outcome.   REHAB POTENTIAL: Good  CLINICAL DECISION MAKING: Stable/uncomplicated  EVALUATION COMPLEXITY: Low   GOALS: Goals reviewed with patient? Yes  SHORT TERM GOALS: Target date: 12/27/2021  Pt will be independent with HEP.   Baseline: Goal status: INITIAL  2.  Pt will be independent with the knack, urge suppression technique, and double voiding in order to improve bladder habits and decrease urinary incontinence.   Baseline:  Goal status: INITIAL  3.  Pt will be able to correctly perform diaphragmatic breathing and appropriate pressure management in order to prevent worsening vaginal wall laxity and improve pelvic floor A/ROM.   Baseline:  Goal status: INITIAL    LONG TERM GOALS: Target date: 02/21/2022   Pt will be independent with advanced HEP.   Baseline:  Goal status: INITIAL  2.  Pt will demonstrate normal pelvic floor muscle tone and A/ROM, able to achieve 3/5 strength with contractions and 10 sec endurance, in order to provide appropriate lumbopelvic support in functional activities.   Baseline:  Goal status: INITIAL  3.  Pt will be able  to go 2-3 hours in between voids without urgency or incontinence in order to improve QOL and perform all functional activities with less difficulty.   Baseline:  Goal status: INITIAL  4.  Pt will report no episodes of urinary incontinence in order to improve confidence in community activities and personal hygiene.   Baseline:  Goal status: INITIAL  5.  Pt will report no greater than 2/10 low back pain with any activity, especially ability to pick up baby, bend forward, bed mob/ility, and prolonged sitting.  Baseline:  Goal status: INITIAL  6.  Pt will improve PFIQ score by  50% or more.  Baseline: 90 Goal status: INITIAL  PLAN:  PT FREQUENCY: 1x/week  PT DURATION: 12 weeks  PLANNED INTERVENTIONS: Therapeutic exercises, Therapeutic activity, Neuromuscular re-education, Balance training, Gait training, Patient/Family education, Self Care, Joint mobilization, Dry Needling, Biofeedback, and Manual therapy  PLAN FOR NEXT SESSION: Manual techniques to low back and bil posterior hips; gentle core strengthening and mobility exercises to tolerance. 3rd treatment session consider pelvic floor exam.    PHYSICAL THERAPY DISCHARGE SUMMARY  Visits from Start of Care: 0  Current functional level related to goals / functional outcomes: Not started   Remaining deficits: See above   Education / Equipment: HEP   Patient agrees to discharge. Patient goals were not met. Patient is being discharged due to not returning since the last visit.  Heather Roberts, PT, DPT11/01/231:40 PM

## 2021-11-29 ENCOUNTER — Ambulatory Visit: Payer: Medicaid Other | Attending: Advanced Practice Midwife

## 2021-11-29 ENCOUNTER — Other Ambulatory Visit: Payer: Self-pay

## 2021-11-29 DIAGNOSIS — R279 Unspecified lack of coordination: Secondary | ICD-10-CM | POA: Insufficient documentation

## 2021-11-29 DIAGNOSIS — M546 Pain in thoracic spine: Secondary | ICD-10-CM | POA: Diagnosis not present

## 2021-11-29 DIAGNOSIS — M6281 Muscle weakness (generalized): Secondary | ICD-10-CM | POA: Diagnosis not present

## 2021-11-29 DIAGNOSIS — R293 Abnormal posture: Secondary | ICD-10-CM | POA: Insufficient documentation

## 2021-11-29 DIAGNOSIS — M62838 Other muscle spasm: Secondary | ICD-10-CM | POA: Diagnosis not present

## 2021-11-29 DIAGNOSIS — M6289 Other specified disorders of muscle: Secondary | ICD-10-CM | POA: Insufficient documentation

## 2021-11-29 DIAGNOSIS — R3915 Urgency of urination: Secondary | ICD-10-CM | POA: Insufficient documentation

## 2021-12-12 NOTE — Therapy (Incomplete)
OUTPATIENT PHYSICAL THERAPY TREATMENT NOTE   Patient Name: Heather Savage MRN: [id number] DOB:1990-03-24, 31 y.o., female Today's Date: 12/12/2021  PCP: NA REFERRING PROVIDER: Hurshel Party, CNM  END OF SESSION:    Past Medical History:  Diagnosis Date   Anemia    states has low blood count, no current meds.   Hearing loss    right   History of gastritis    no current med.   History of gestational diabetes 07/16/2018   Normal postpartum GTT   History of malaria 2012   Preterm labor    Rh negative, maternal    Tympanic membrane perforation 05/2012   right   Past Surgical History:  Procedure Laterality Date   CHOLECYSTECTOMY N/A 09/27/2018   Procedure: LAPAROSCOPIC CHOLECYSTECTOMY WITH INTRAOPERATIVE CHOLANGIOGRAM;  Surgeon: Andria Meuse, MD;  Location: [facility];  Service: General;  Laterality: N/A;   ERCP N/A 09/28/2018   Procedure: ENDOSCOPIC RETROGRADE CHOLANGIOPANCREATOGRAPHY (ERCP);  Surgeon: Meryl Dare, MD;  Location: [facility];  Service: Endoscopy;  Laterality: N/A;   EXTERNAL EAR SURGERY     REMOVAL OF STONES  09/28/2018   Procedure: REMOVAL OF STONES;  Surgeon: Meryl Dare, MD;  Location: [facility];  Service: Endoscopy;;   SCAR REVISION  08/27/2011   Procedure: SCAR REVISION;  Surgeon: Louisa Second, MD;  Location: [facility];  Service: Plastics;  Laterality: N/A;  scar revision of forehead   SPHINCTEROTOMY  09/28/2018   Procedure: SPHINCTEROTOMY;  Surgeon: Meryl Dare, MD;  Location: [facility];  Service: Endoscopy;;   TYMPANOPLASTY  05/14/2011   Procedure: TYMPANOPLASTY;  Surgeon: Serena Colonel, MD;  Location: [facility];  Service: ENT;  Laterality: Left;  TYMPANOPLASYT AND  POSSIBLE OSSICULARPLASTY    TYMPANOPLASTY  10/10/2011   Procedure: TYMPANOPLASTY;  Surgeon: Serena Colonel, MD;  Location: [facility];  Service: ENT;  Laterality: Right;   TYMPANOPLASTY Right 06/30/2012   Procedure: REVISION RIGHT  TYMPANOPLASTY;  Surgeon: Serena Colonel, MD;  Location: [facility];  Service: ENT;  Laterality: Right;   Patient Active Problem List   Diagnosis Date Noted   ASCUS of cervix with negative high risk HPV 11/12/2021   Encounter for induction of labor 09/23/2021   Gestational diabetes 07/18/2021   Supervision of normal pregnancy 03/10/2021   S/P laparoscopic cholecystectomy 09/27/2018   History of gestational diabetes 07/16/2018   Rh negative, maternal 02/08/2016   Language barrier 01/02/2016   Hearing loss 05/01/2011    REFERRING DIAG: M62.89 (ICD-10-CM) - Pelvic floor dysfunction in female M54.6 (ICD-10-CM) - Acute bilateral thoracic back pain R39.15 (ICD-10-CM) - Urinary urgency  THERAPY DIAG:  No diagnosis found.  Rationale for Evaluation and Treatment Rehabilitation  PERTINENT HISTORY: G4P3  PRECAUTIONS: NA  SUBJECTIVE:  SUBJECTIVE STATEMENT:  ***   PAIN:  Are you having pain? {OPRCPAIN:27236}   11/29/21 SUBJECTIVE STATEMENT: Pt states that she has been having low back pain that started during pregnancy. She has been referred because of prolapse.  Fluid intake: Yes: not assessed this session     PAIN:  Are you having pain? Yes NPRS scale: 8/10 Pain location: low back pain   Pain type: tight Pain description: intermittent    Aggravating factors: prolonged sitting, sleeping on back, bending forward, picking up baby Relieving factors: massage   PRECAUTIONS: None   WEIGHT BEARING RESTRICTIONS: No   FALLS:  Has patient fallen in last 6 months? No   LIVING ENVIRONMENT: Lives with: lives with their family Lives in: House/apartment   OCCUPATION: does not work outside of the home   PLOF: Independent   PATIENT GOALS: to decrease pain   PERTINENT HISTORY:  G4P3 Sexual  abuse: No   BOWEL MOVEMENT: Pain with bowel movement: No Type of bowel movement:Frequency 1x every 3 days  and Strain Yes (sometimes) Fully empty rectum: Yes: - Leakage: No Pads: No Fiber supplement: No   URINATION: Pain with urination: No Fully empty bladder: Yes: - Stream: Strong Urgency: Yes: cannot hold it Frequency: 1.5 hours Leakage: Urge to void, Walking to the bathroom, Coughing, Sneezing, and Laughing Pads: Yes: -   INTERCOURSE: Pain with intercourse:  has not returned to intercourse, but no history of pain     PREGNANCY: Vaginal deliveries 3 Tearing No C-section deliveries 0 Currently pregnant No   PROLAPSE: Report of vaginal bulging     OBJECTIVE:    11/28/21: PATIENT SURVEYS:    PFIQ-7 90   COGNITION: Overall cognitive status: Within functional limits for tasks assessed                          SENSATION: Light touch: Appears intact Proprioception: Appears intact   MUSCLE LENGTH: decreased left hip flexor length, very limited bil piriformis/hamstrings     FUNCTIONAL TESTS:  Poor bed mobility that increases pain; pain with lying supine   GAIT: Comments: WNL   POSTURE: rounded shoulders, forward head, increased lumbar lordosis, increased thoracic kyphosis, and anterior pelvic tilt     LUMBARAROM/PROM:   A/PROM A/PROM  Eval (%)  Flexion 75, pain  Extension 50, pain  Right lateral flexion 75  Left lateral flexion 50, pain  Right rotation 50  Left rotation 50, pain   (Blank rows = not tested)     LOWER EXTREMITY MMT: grossly 3/5     PALPATION:   General  tenderness throughout lumbar paraspinals bil                 External Perineal Exam NA                             Internal Pelvic Floor NA   Patient confirms identification and approves PT to assess internal pelvic floor and treatment Yes will perform in future treatment sessions   PELVIC MMT:   MMT eval  Vaginal    Internal Anal Sphincter    External Anal Sphincter     Puborectalis    Diastasis Recti    (Blank rows = not tested)         TONE: High tone in low back back   PROLAPSE: NA   TODAY'S TREATMENT 12/13/21:  Manual: Soft tissue mobilization: Scar tissue mobilization: Myofascial release:  Spinal mobilization: Internal pelvic floor techniques: Dry needling: Neuromuscular re-education: Core retraining:  Core facilitation: Form correction: Pelvic floor contraction training: Down training: Exercises: Stretches/mobility: Strengthening: Therapeutic activities: Functional strengthening activities: Self-care:                                                                                                                               DATE: 11/29/21  EVAL  Exercises: Stretches/mobility: Piriformis stretch 60 sec bil Hamstring stretch 60 sec bil Strengthening: Seated hip adduction 10 x 5 sec Seated hip abduction 5x red band Seated marching 10x   Check all possible CPT codes: 97110- Therapeutic Exercise                          Check all conditions that are expected to impact treatment: Musculoskeletal disorders and Current pregnancy or recent postpartum              If treatment provided at initial evaluation, no treatment charged due to lack of authorization.                                  PATIENT EDUCATION:  Education details: education on condition and treatment Person educated: Patient Education method: Explanation, Demonstration, Tactile cues, Verbal cues, and Handouts Education comprehension: verbalized understanding   HOME EXERCISE PROGRAM: [id number]   ASSESSMENT:   CLINICAL IMPRESSION: She will continue to benefit from skilled PT intervention in order to decrease pain, decrease urinary urgency/incontinence, begin and progress functional strengthening program, and improve QOL.    OBJECTIVE IMPAIRMENTS: decreased coordination, decreased endurance, decreased mobility, decreased ROM, decreased strength, impaired  tone, postural dysfunction, and pain.    ACTIVITY LIMITATIONS: carrying, lifting, bending, sitting, standing, squatting, transfers, bed mobility, continence, and locomotion level   PARTICIPATION LIMITATIONS: community activity   PERSONAL FACTORS: 1 comorbidity: G4P3  are also affecting patient's functional outcome.    REHAB POTENTIAL: Good   CLINICAL DECISION MAKING: Stable/uncomplicated   EVALUATION COMPLEXITY: Low     GOALS: Goals reviewed with patient? Yes   SHORT TERM GOALS: Target date: 12/27/2021   Pt will be independent with HEP.    Baseline: Goal status: INITIAL   2.  Pt will be independent with the knack, urge suppression technique, and double voiding in order to improve bladder habits and decrease urinary incontinence.    Baseline:  Goal status: INITIAL   3.  Pt will be able to correctly perform diaphragmatic breathing and appropriate pressure management in order to prevent worsening vaginal wall laxity and improve pelvic floor A/ROM.    Baseline:  Goal status: INITIAL       LONG TERM GOALS: Target date: 02/21/2022    Pt will be independent with advanced HEP.    Baseline:  Goal status: INITIAL   2.  Pt will demonstrate normal pelvic floor muscle tone and A/ROM, able to achieve  3/5 strength with contractions and 10 sec endurance, in order to provide appropriate lumbopelvic support in functional activities.    Baseline:  Goal status: INITIAL   3.  Pt will be able to go 2-3 hours in between voids without urgency or incontinence in order to improve QOL and perform all functional activities with less difficulty.    Baseline:  Goal status: INITIAL   4.  Pt will report no episodes of urinary incontinence in order to improve confidence in community activities and personal hygiene.    Baseline:  Goal status: INITIAL   5.  Pt will report no greater than 2/10 low back pain with any activity, especially ability to pick up baby, bend forward, bed mob/ility, and  prolonged sitting.  Baseline:  Goal status: INITIAL   6.  Pt will improve PFIQ score by 50% or more.  Baseline: 90 Goal status: INITIAL   PLAN:   PT FREQUENCY: 1x/week   PT DURATION: 12 weeks   PLANNED INTERVENTIONS: Therapeutic exercises, Therapeutic activity, Neuromuscular re-education, Balance training, Gait training, Patient/Family education, Self Care, Joint mobilization, Dry Needling, Biofeedback, and Manual therapy   PLAN FOR NEXT SESSION: Manual techniques to low back and bil posterior hips; gentle core strengthening and mobility exercises to tolerance. 3rd treatment session consider pelvic floor exam.    Julio Alm, PT, DPT11/14/233:21 PM

## 2021-12-13 ENCOUNTER — Ambulatory Visit: Payer: Medicaid Other

## 2021-12-20 ENCOUNTER — Ambulatory Visit: Payer: Medicaid Other

## 2021-12-27 ENCOUNTER — Ambulatory Visit: Payer: Medicaid Other

## 2021-12-27 ENCOUNTER — Telehealth: Payer: Self-pay

## 2021-12-27 NOTE — Telephone Encounter (Signed)
Called and left message for patient that she will be discharged after no-show appointment today and 3rd missed appointment.

## 2022-01-08 ENCOUNTER — Other Ambulatory Visit (HOSPITAL_BASED_OUTPATIENT_CLINIC_OR_DEPARTMENT_OTHER): Payer: Self-pay | Admitting: Advanced Practice Midwife

## 2022-01-08 DIAGNOSIS — M6289 Other specified disorders of muscle: Secondary | ICD-10-CM

## 2022-01-08 DIAGNOSIS — M546 Pain in thoracic spine: Secondary | ICD-10-CM

## 2022-01-08 NOTE — Progress Notes (Signed)
Pt called office because she cancelled 3 visits to physical therapy and would like to resume physical therapy at this time.  New order for PT placed today.

## 2022-02-06 ENCOUNTER — Ambulatory Visit: Payer: Medicaid Other | Attending: Advanced Practice Midwife | Admitting: Physical Therapy

## 2022-02-06 DIAGNOSIS — R279 Unspecified lack of coordination: Secondary | ICD-10-CM

## 2022-02-06 DIAGNOSIS — M62838 Other muscle spasm: Secondary | ICD-10-CM

## 2022-02-06 DIAGNOSIS — R293 Abnormal posture: Secondary | ICD-10-CM

## 2022-02-06 DIAGNOSIS — M6281 Muscle weakness (generalized): Secondary | ICD-10-CM | POA: Diagnosis not present

## 2022-02-06 DIAGNOSIS — M546 Pain in thoracic spine: Secondary | ICD-10-CM | POA: Insufficient documentation

## 2022-02-06 DIAGNOSIS — M6289 Other specified disorders of muscle: Secondary | ICD-10-CM | POA: Insufficient documentation

## 2022-02-06 NOTE — Therapy (Deleted)
OUTPATIENT PHYSICAL THERAPY FEMALE PELVIC EVALUATION   Patient Name: Heather Savage MRN: [id number] DOB:03/24/1990, 32 y.o., female Today's Date: 02/06/2022  END OF SESSION:   Past Medical History:  Diagnosis Date   Anemia    states has low blood count, no current meds.   Hearing loss    right   History of gastritis    no current med.   History of gestational diabetes 07/16/2018   Normal postpartum GTT   History of malaria 2012   Preterm labor    Rh negative, maternal    Tympanic membrane perforation 05/2012   right   Past Surgical History:  Procedure Laterality Date   CHOLECYSTECTOMY N/A 09/27/2018   Procedure: LAPAROSCOPIC CHOLECYSTECTOMY WITH INTRAOPERATIVE CHOLANGIOGRAM;  Surgeon: Ileana Roup, MD;  Location: [facility];  Service: General;  Laterality: N/A;   ERCP N/A 09/28/2018   Procedure: ENDOSCOPIC RETROGRADE CHOLANGIOPANCREATOGRAPHY (ERCP);  Surgeon: Ladene Artist, MD;  Location: [facility];  Service: Endoscopy;  Laterality: N/A;   EXTERNAL EAR SURGERY     REMOVAL OF STONES  09/28/2018   Procedure: REMOVAL OF STONES;  Surgeon: Ladene Artist, MD;  Location: [facility];  Service: Endoscopy;;   SCAR REVISION  08/27/2011   Procedure: SCAR REVISION;  Surgeon: Cristine Polio, MD;  Location: [facility];  Service: Plastics;  Laterality: N/A;  scar revision of forehead   SPHINCTEROTOMY  09/28/2018   Procedure: SPHINCTEROTOMY;  Surgeon: Ladene Artist, MD;  Location: [facility];  Service: Endoscopy;;   TYMPANOPLASTY  05/14/2011   Procedure: TYMPANOPLASTY;  Surgeon: Izora Gala, MD;  Location: [place];  Service: ENT;  Laterality: Left;  TYMPANOPLASYT AND  POSSIBLE OSSICULARPLASTY    TYMPANOPLASTY  10/10/2011   Procedure: TYMPANOPLASTY;  Surgeon: Izora Gala, MD;  Location: [facility];  Service: ENT;  Laterality: Right;   TYMPANOPLASTY Right 06/30/2012   Procedure: REVISION RIGHT TYMPANOPLASTY;  Surgeon: Izora Gala, MD;  Location:  [place];  Service: ENT;  Laterality: Right;   Patient Active Problem List   Diagnosis Date Noted   ASCUS of cervix with negative high risk HPV 11/12/2021   Encounter for induction of labor 09/23/2021   Gestational diabetes 07/18/2021   Supervision of normal pregnancy 03/10/2021   S/P laparoscopic cholecystectomy 09/27/2018   History of gestational diabetes 07/16/2018   Rh negative, maternal 02/08/2016   Language barrier 01/02/2016   Hearing loss 05/01/2011    PCP: No  REFERRING PROVIDER: Elvera Maria, CNM   REFERRING DIAG:  M62.89 (ICD-10-CM) - Pelvic floor dysfunction in female  M54.6 (ICD-10-CM) - Acute bilateral thoracic back pain    THERAPY DIAG:  No diagnosis found.  Rationale for Evaluation and Treatment: Rehabilitation  ONSET DATE: ***  SUBJECTIVE:  SUBJECTIVE STATEMENT: *** Fluid intake: {Yes/No:[id number]}   PAIN:  Are you having pain? {yes/no:20286} NPRS scale: ***/10 Pain location: {pelvic pain location:27098}  Pain type: {type:313116} Pain description: {PAIN DESCRIPTION:21022940}   Aggravating factors: *** Relieving factors: ***  PRECAUTIONS: {Therapy precautions:24002}  WEIGHT BEARING RESTRICTIONS: {Yes ***/No:24003}  FALLS:  Has patient fallen in last 6 months? {fallsyesno:27318}  LIVING ENVIRONMENT: Lives with: {OPRC lives with:25569::"lives with their family"} Lives in: {Lives in:25570} Stairs: {opstairs:27293} Has following equipment at home: {Assistive devices:23999}  OCCUPATION: ***  PLOF: {PLOF:24004}  PATIENT GOALS: ***  PERTINENT HISTORY:  *** Sexual abuse: {Yes/No:[id number]}  BOWEL MOVEMENT: Pain with bowel movement: {yes/no:20286} Type of bowel movement:{PT BM type:27100} Fully empty rectum:  {Yes/No:304960894} Leakage: {Yes/No:304960894} Pads: {Yes/No:304960894} Fiber supplement: {Yes/No:304960894}  URINATION: Pain with urination: {yes/no:20286} Fully empty bladder: {Yes/No:304960894} Stream: {PT urination:27102} Urgency: {Yes/No:304960894} Frequency: *** Leakage: {PT leakage:27103} Pads: {Yes/No:304960894}  INTERCOURSE: Pain with intercourse: {pain with intercourse PA:27099} Ability to have vaginal penetration:  {Yes/No:304960894} Climax: *** Marinoff Scale: ***/3  PREGNANCY: Vaginal deliveries *** Tearing {Yes***/No:304960894} C-section deliveries *** Currently pregnant {Yes***/No:[id number]}  PROLAPSE: {PT prolapse:27101}   OBJECTIVE:   DIAGNOSTIC FINDINGS:  ***  PATIENT SURVEYS:  {rehab surveys:24030}  PFIQ-7 ***  COGNITION: Overall cognitive status: {cognition:24006}     SENSATION: Light touch: {intact/deficits:24005} Proprioception: {intact/deficits:24005}  MUSCLE LENGTH: Hamstrings: Right *** deg; Left *** deg Thomas test: Right *** deg; Left *** deg  LUMBAR SPECIAL TESTS:  {lumbar special test:25242}  FUNCTIONAL TESTS:  {Functional tests:24029}  GAIT: Distance walked: *** Assistive device utilized: {Assistive devices:23999} Level of assistance: {Levels of assistance:24026} Comments: ***  POSTURE: {posture:25561}  PELVIC ALIGNMENT:  LUMBARAROM/PROM:  A/PROM A/PROM  eval  Flexion   Extension   Right lateral flexion   Left lateral flexion   Right rotation   Left rotation    (Blank rows = not tested)  LOWER EXTREMITY ROM:  {AROM/PROM:27142} ROM Right eval Left eval  Hip flexion    Hip extension    Hip abduction    Hip adduction    Hip internal rotation    Hip external rotation    Knee flexion    Knee extension    Ankle dorsiflexion    Ankle plantarflexion    Ankle inversion    Ankle eversion     (Blank rows = not tested)  LOWER EXTREMITY MMT:  MMT Right eval Left eval  Hip flexion    Hip extension     Hip abduction    Hip adduction    Hip internal rotation    Hip external rotation    Knee flexion    Knee extension    Ankle dorsiflexion    Ankle plantarflexion    Ankle inversion    Ankle eversion     PALPATION:   General  ***                External Perineal Exam ***                             Internal Pelvic Floor ***  Patient confirms identification and approves PT to assess internal pelvic floor and treatment {yes/no:20286}  PELVIC MMT:   MMT eval  Vaginal   Internal Anal Sphincter   External Anal Sphincter   Puborectalis   Diastasis Recti   (Blank rows = not tested)        TONE: ***  PROLAPSE: ***  TODAY'S TREATMENT:  DATE: ***  EVAL ***   PATIENT EDUCATION:  Education details: *** Person educated: {Person educated:25204} Education method: {Education Method:25205} Education comprehension: {Education Comprehension:25206}  HOME EXERCISE PROGRAM: ***  ASSESSMENT:  CLINICAL IMPRESSION: Patient is a *** y.o. *** who was seen today for physical therapy evaluation and treatment for ***.   OBJECTIVE IMPAIRMENTS: {opptimpairments:25111}.   ACTIVITY LIMITATIONS: {activitylimitations:27494}  PARTICIPATION LIMITATIONS: {participationrestrictions:25113}  PERSONAL FACTORS: {Personal factors:25162} are also affecting patient's functional outcome.   REHAB POTENTIAL: {rehabpotential:25112}  CLINICAL DECISION MAKING: {clinical decision making:25114}  EVALUATION COMPLEXITY: {Evaluation complexity:25115}   GOALS: Goals reviewed with patient? {yes/no:20286}  SHORT TERM GOALS: Target date: ***  *** Baseline: Goal status: {GOALSTATUS:25110}  2.  *** Baseline:  Goal status: {GOALSTATUS:25110}  3.  *** Baseline:  Goal status: {GOALSTATUS:25110}  4.  *** Baseline:  Goal status: {GOALSTATUS:25110}  5.  *** Baseline:   Goal status: {GOALSTATUS:25110}  6.  *** Baseline:  Goal status: {GOALSTATUS:25110}  LONG TERM GOALS: Target date: ***  *** Baseline:  Goal status: {GOALSTATUS:25110}  2.  *** Baseline:  Goal status: {GOALSTATUS:25110}  3.  *** Baseline:  Goal status: {GOALSTATUS:25110}  4.  *** Baseline:  Goal status: {GOALSTATUS:25110}  5.  *** Baseline:  Goal status: {GOALSTATUS:25110}  6.  *** Baseline:  Goal status: {GOALSTATUS:25110}  PLAN:  PT FREQUENCY: {rehab frequency:25116}  PT DURATION: {rehab duration:25117}  PLANNED INTERVENTIONS: {rehab planned interventions:25118::"Therapeutic exercises","Therapeutic activity","Neuromuscular re-education","Balance training","Gait training","Patient/Family education","Self Care","Joint mobilization"}  PLAN FOR NEXT SESSION: ***   Brayton Caves Tabria Steines, PT 02/06/2022, 2:04 PM

## 2022-02-06 NOTE — Therapy (Signed)
OUTPATIENT PHYSICAL THERAPY FEMALE PELVIC EVALUATION   Patient Name: Heather Savage MRN: [id number] DOB:1990-11-04, 32 y.o., female Today's Date: 02/06/2022   PT End of Session - 02/06/22 1700     Visit Number 1    Date for PT Re-Evaluation 05/01/22    Authorization Type healthy [employer]    PT Start Time 1615    PT Stop Time 1650    PT Time Calculation (min) 35 min    Activity Tolerance Patient tolerated treatment well    Behavior During Therapy WFL for tasks assessed/performed              Past Medical History:  Diagnosis Date   Anemia    states has low blood count, no current meds.   Hearing loss    right   History of gastritis    no current med.   History of gestational diabetes 07/16/2018   Normal postpartum GTT   History of malaria 2012   Preterm labor    Rh negative, maternal    Tympanic membrane perforation 05/2012   right   Past Surgical History:  Procedure Laterality Date   CHOLECYSTECTOMY N/A 09/27/2018   Procedure: LAPAROSCOPIC CHOLECYSTECTOMY WITH INTRAOPERATIVE CHOLANGIOGRAM;  Surgeon: Ileana Roup, MD;  Location: [facility];  Service: General;  Laterality: N/A;   ERCP N/A 09/28/2018   Procedure: ENDOSCOPIC RETROGRADE CHOLANGIOPANCREATOGRAPHY (ERCP);  Surgeon: Ladene Artist, MD;  Location: [facility];  Service: Endoscopy;  Laterality: N/A;   EXTERNAL EAR SURGERY     REMOVAL OF STONES  09/28/2018   Procedure: REMOVAL OF STONES;  Surgeon: Ladene Artist, MD;  Location: [facility];  Service: Endoscopy;;   SCAR REVISION  08/27/2011   Procedure: SCAR REVISION;  Surgeon: Cristine Polio, MD;  Location: [place];  Service: Plastics;  Laterality: N/A;  scar revision of forehead   SPHINCTEROTOMY  09/28/2018   Procedure: SPHINCTEROTOMY;  Surgeon: Ladene Artist, MD;  Location: [facility];  Service: Endoscopy;;   TYMPANOPLASTY  05/14/2011   Procedure: TYMPANOPLASTY;  Surgeon: Izora Gala, MD;  Location: [place];   Service: ENT;  Laterality: Left;  TYMPANOPLASYT AND  POSSIBLE OSSICULARPLASTY    TYMPANOPLASTY  10/10/2011   Procedure: TYMPANOPLASTY;  Surgeon: Izora Gala, MD;  Location: [place];  Service: ENT;  Laterality: Right;   TYMPANOPLASTY Right 06/30/2012   Procedure: REVISION RIGHT TYMPANOPLASTY;  Surgeon: Izora Gala, MD;  Location: [place];  Service: ENT;  Laterality: Right;   Patient Active Problem List   Diagnosis Date Noted   ASCUS of cervix with negative high risk HPV 11/12/2021   Encounter for induction of labor 09/23/2021   Gestational diabetes 07/18/2021   Supervision of normal pregnancy 03/10/2021   S/P laparoscopic cholecystectomy 09/27/2018   History of gestational diabetes 07/16/2018   Rh negative, maternal 02/08/2016   Language barrier 01/02/2016   Hearing loss 05/01/2011    PCP: None  REFERRING PROVIDER: Elvera Maria, CNM  REFERRING DIAG:  M62.89 (ICD-10-CM) - Pelvic floor dysfunction in female  M54.6 (ICD-10-CM) - Acute bilateral thoracic back pain      THERAPY DIAG:  Abnormal posture  Muscle weakness (generalized)  Other muscle spasm  Unspecified lack of coordination  Rationale for Evaluation and Treatment: Rehabilitation  ONSET DATE: 9 month ago  SUBJECTIVE:  SUBJECTIVE STATEMENT: Pt states that she has been having low back pain that started during pregnancy. She has been referred because of prolapse and back pain and walking causing me to lock up after a couple hours.  Fluid intake: Yes: not assessed this session    PAIN:  Are you having pain? Yes NPRS scale: 6/10 Pain location: low back pain  Pain type: tight Pain description: intermittent   Aggravating factors: prolonged sitting, sleeping on back, bending forward, picking up baby,  walking Relieving factors: massage  PRECAUTIONS: None  WEIGHT BEARING RESTRICTIONS: No  FALLS:  Has patient fallen in last 6 months? No  LIVING ENVIRONMENT: Lives with: lives with their family Lives in: House/apartment  OCCUPATION: does not work outside of the home  PLOF: Independent  PATIENT GOALS: to decrease pain  PERTINENT HISTORY:  G4P3 Sexual abuse: No  BOWEL MOVEMENT: Pain with bowel movement: No Type of bowel movement:Frequency 1x every 3 days  and Strain Yes (sometimes) Fully empty rectum: Yes: - Leakage: No Pads: No Fiber supplement: No  URINATION: Pain with urination: No Fully empty bladder: Yes: - Stream: Strong Urgency: Yes: cannot hold it Frequency: 1.5 hours Leakage: Urge to void, Walking to the bathroom, Coughing, Sneezing, and Laughing Pads: Yes: -  INTERCOURSE: Pain with intercourse:  has not returned to intercourse, but no history of pain   PREGNANCY: Vaginal deliveries 3 Tearing No C-section deliveries 0 Currently pregnant No  PROLAPSE: Report of vaginal bulging   OBJECTIVE:   02/06/22: PATIENT SURVEYS:   PFIQ-7 90  COGNITION: Overall cognitive status: Within functional limits for tasks assessed     SENSATION: Light touch: Appears intact Proprioception: Appears intact  MUSCLE LENGTH: decreased left hip flexor length, very limited bil piriformis/hamstrings  Lumbar special test - SLR and slump positive on Rt side FUNCTIONAL TESTS:  Single leg standing on Rt LE increased pain and trunk lean to the Rt  GAIT: Comments: WNL  POSTURE: rounded shoulders, forward head, increased lumbar lordosis, increased thoracic kyphosis, and anterior pelvic tilt   LUMBARAROM/PROM:  A/PROM A/PROM  Eval (%)  Flexion 75, pain  Extension 50, pain  Right lateral flexion 75  Left lateral flexion 50, pain  Right rotation 50  Left rotation 50, pain   (Blank rows = not tested)   LOWER EXTREMITY MMT: Rt hip flexion 4/5; Rt knee ext 4/5,  Rt ankle DF 3/5 - all with pain; Lt hip 5/5   PALPATION:   General  tenderness throughout lumbar paraspinals bil                External Perineal Exam NA                             Internal Pelvic Floor NA  Patient confirms identification and approves PT to assess internal pelvic floor and treatment Yes will perform in future treatment sessions  PELVIC MMT:   MMT eval  Vaginal   Internal Anal Sphincter   External Anal Sphincter   Puborectalis   Diastasis Recti   (Blank rows = not tested)        TONE: High tone in low back and gluteals  PROLAPSE: NA  TODAY'S TREATMENT:  DATE: 02/06/22                  EVAL  Exercises: Stretches/mobility: Seated thoracic ext - 10x Hamstring stretch 60 sec bil Tennis ball massage to gluteals   Check all possible CPT codes: 02542- Therapeutic Exercise    Check all conditions that are expected to impact treatment: Musculoskeletal disorders and Current pregnancy or recent postpartum   If treatment provided at initial evaluation, no treatment charged due to lack of authorization.        PATIENT EDUCATION:  Education details: education on condition and treatment Person educated: Patient Education method: Explanation, Demonstration, Tactile cues, Verbal cues, and Handouts Education comprehension: verbalized understanding  HOME EXERCISE PROGRAM: [email or web address] Access Code: [email or web address] URL: [email or web address] Date: 02/06/2022 Prepared by: Dwana Curd  Exercises - Seated Pelvic Floor Contraction with Isometric Hip Adduction  - 3 x daily - 7 x weekly - 1 sets - 10 reps - Seated Pelvic Floor Contraction with Hip Abduction and Resistance Loop  - 3 x daily - 7 x weekly - 1 sets - 10 reps - Seated March  - 3 x daily - 7 x weekly - 3 sets - 10 reps - Seated Piriformis Stretch with Trunk Bend  - 3 x  daily - 7 x weekly - 1 sets - 2 reps - 60 hold - Seated Hamstring Stretch  - 3 x daily - 7 x weekly - 1 sets - 2 reps - 60 hold - Seated Thoracic Extension with Hands Behind Neck  - 1 x daily - 7 x weekly - 3 sets - 10 reps  ASSESSMENT:  CLINICAL IMPRESSION: Patient is a 32 y.o. female who was seen today for physical therapy evaluation and treatment for low back pain; secondary complaints are urinary urgency and incontinence. Exam findings limited to external evaluation today and notable for abnormal posture, reduced/painful lumbar A/ROM, Rt hip weakness, decreased flexibility, impaired bed mobility, and tenderness throughout lumbar musculature and glutes; internal vaginal assessment discussed and pt agreeable. Signs and symptoms are most consistent with deep core weakness and coordination (including pelvic floor). Initial treatment consisted of seated exercises and stretches (for improved comfort); once moving to seated position, pt able to comfortable perform exercises. She will benefit from skilled PT intervention in order to decrease pain, decrease urinary urgency/incontinence, begin and progress functional strengthening program, and improve QOL.   OBJECTIVE IMPAIRMENTS: decreased coordination, decreased endurance, decreased mobility, decreased ROM, decreased strength, impaired tone, postural dysfunction, and pain.   ACTIVITY LIMITATIONS: carrying, lifting, bending, sitting, standing, squatting, transfers, bed mobility, continence, and locomotion level  PARTICIPATION LIMITATIONS: community activity  PERSONAL FACTORS: 1 comorbidity: G4P3  are also affecting patient's functional outcome.   REHAB POTENTIAL: Good  CLINICAL DECISION MAKING: Stable/uncomplicated  EVALUATION COMPLEXITY: Low   GOALS: Goals reviewed with patient? Yes  SHORT TERM GOALS: Target date: 03/06/2022  Pt will be independent with HEP.   Baseline: Goal status: INITIAL  2.  Pt will be independent with the knack, urge  suppression technique, and double voiding in order to improve bladder habits and decrease urinary incontinence.   Baseline:  Goal status: INITIAL  3.  Pt will be able to correctly perform diaphragmatic breathing and appropriate pressure management in order to prevent worsening vaginal wall laxity and improve pelvic floor A/ROM.   Baseline:  Goal status: INITIAL    LONG TERM GOALS: Target date: 05/01/2022   Pt will be independent with advanced HEP.   Baseline:  Goal status: INITIAL  2.  Pt will demonstrate normal pelvic floor muscle tone and A/ROM, able to achieve 3/5 strength with contractions and 10 sec endurance, in order to provide appropriate lumbopelvic support in functional activities.   Baseline:  Goal status: INITIAL  3.  Pt will be able to go 2-3 hours in between voids without urgency or incontinence in order to improve QOL and perform all functional activities with less difficulty.   Baseline:  Goal status: INITIAL  4.  Pt will report no episodes of urinary incontinence in order to improve confidence in community activities and personal hygiene.   Baseline:  Goal status: INITIAL  5.  Pt will report no greater than 2/10 low back pain with any activity, especially ability to pick up baby, bend forward, bed mob/ility, and prolonged sitting.  Baseline:  Goal status: INITIAL  6.  Pt will improve PFIQ score by 50% or more.  Baseline: 90 Goal status: INITIAL  PLAN:  PT FREQUENCY: 1x/week  PT DURATION: 12 weeks  PLANNED INTERVENTIONS: Therapeutic exercises, Therapeutic activity, Neuromuscular re-education, Balance training, Gait training, Patient/Family education, Self Care, Joint mobilization, Dry Needling, Biofeedback, and Manual therapy  PLAN FOR NEXT SESSION: Manual techniques to low back and bil posterior hips; gentle core strengthening and mobility exercises to tolerance. 3rd treatment session consider pelvic floor exam.   Gustavus Bryant, PT, DPT 02/06/22  5:01 PM

## 2022-03-14 ENCOUNTER — Ambulatory Visit: Payer: Medicaid Other | Attending: Advanced Practice Midwife | Admitting: Physical Therapy

## 2022-03-14 ENCOUNTER — Encounter: Payer: Self-pay | Admitting: Physical Therapy

## 2022-03-14 DIAGNOSIS — M62838 Other muscle spasm: Secondary | ICD-10-CM | POA: Diagnosis not present

## 2022-03-14 DIAGNOSIS — M6281 Muscle weakness (generalized): Secondary | ICD-10-CM | POA: Insufficient documentation

## 2022-03-14 DIAGNOSIS — R279 Unspecified lack of coordination: Secondary | ICD-10-CM | POA: Insufficient documentation

## 2022-03-14 DIAGNOSIS — R293 Abnormal posture: Secondary | ICD-10-CM | POA: Diagnosis not present

## 2022-03-14 NOTE — Therapy (Signed)
OUTPATIENT PHYSICAL THERAPY FEMALE PELVIC TREATMENT   Patient Name: Heather Savage MRN: [id number] DOB:Feb 20, 1990, 32 y.o., female Today's Date: 03/14/2022   PT End of Session - 03/14/22 1149     Visit Number 2    Date for PT Re-Evaluation 05/01/22    Authorization Type healthy blue auth until 3/8    Authorization - Number of Visits 7    PT Start Time [id number]    PT Stop Time 1227    PT Time Calculation (min) 38 min    Activity Tolerance Patient tolerated treatment well    Behavior During Therapy WFL for tasks assessed/performed               Past Medical History:  Diagnosis Date   Anemia    states has low blood count, no current meds.   Hearing loss    right   History of gastritis    no current med.   History of gestational diabetes 07/16/2018   Normal postpartum GTT   History of malaria 2012   Preterm labor    Rh negative, maternal    Tympanic membrane perforation 05/2012   right   Past Surgical History:  Procedure Laterality Date   CHOLECYSTECTOMY N/A 09/27/2018   Procedure: LAPAROSCOPIC CHOLECYSTECTOMY WITH INTRAOPERATIVE CHOLANGIOGRAM;  Surgeon: Ileana Roup, MD;  Location: [facility];  Service: General;  Laterality: N/A;   ERCP N/A 09/28/2018   Procedure: ENDOSCOPIC RETROGRADE CHOLANGIOPANCREATOGRAPHY (ERCP);  Surgeon: Ladene Artist, MD;  Location: [facility];  Service: Endoscopy;  Laterality: N/A;   EXTERNAL EAR SURGERY     REMOVAL OF STONES  09/28/2018   Procedure: REMOVAL OF STONES;  Surgeon: Ladene Artist, MD;  Location: [facility];  Service: Endoscopy;;   SCAR REVISION  08/27/2011   Procedure: SCAR REVISION;  Surgeon: Cristine Polio, MD;  Location: [place];  Service: Plastics;  Laterality: N/A;  scar revision of forehead   SPHINCTEROTOMY  09/28/2018   Procedure: SPHINCTEROTOMY;  Surgeon: Ladene Artist, MD;  Location: [facility];  Service: Endoscopy;;   TYMPANOPLASTY  05/14/2011   Procedure: TYMPANOPLASTY;  Surgeon:  Izora Gala, MD;  Location: [place];  Service: ENT;  Laterality: Left;  TYMPANOPLASYT AND  POSSIBLE OSSICULARPLASTY    TYMPANOPLASTY  10/10/2011   Procedure: TYMPANOPLASTY;  Surgeon: Izora Gala, MD;  Location: [place];  Service: ENT;  Laterality: Right;   TYMPANOPLASTY Right 06/30/2012   Procedure: REVISION RIGHT TYMPANOPLASTY;  Surgeon: Izora Gala, MD;  Location: [place];  Service: ENT;  Laterality: Right;   Patient Active Problem List   Diagnosis Date Noted   ASCUS of cervix with negative high risk HPV 11/12/2021   Encounter for induction of labor 09/23/2021   Gestational diabetes 07/18/2021   Supervision of normal pregnancy 03/10/2021   S/P laparoscopic cholecystectomy 09/27/2018   History of gestational diabetes 07/16/2018   Rh negative, maternal 02/08/2016   Language barrier 01/02/2016   Hearing loss 05/01/2011    PCP: None  REFERRING PROVIDER: Elvera Maria, CNM  REFERRING DIAG:  M62.89 (ICD-10-CM) - Pelvic floor dysfunction in female  M54.6 (ICD-10-CM) - Acute bilateral thoracic back pain      THERAPY DIAG:  Abnormal posture  Muscle weakness (generalized)  Unspecified lack of coordination  Other muscle spasm  Rationale for Evaluation and Treatment: Rehabilitation  ONSET DATE: 9 month ago  SUBJECTIVE:  SUBJECTIVE STATEMENT: Pt states she tried to do exercises and use the ball and the pain is better while doing exercises. Fluid intake: Yes: not assessed this session    PAIN:  Are you having pain? Yes NPRS scale: 6/10 Pain location: low back pain on right side  Pain type: tight Pain description: intermittent   Aggravating factors: prolonged sitting, sleeping on back, bending forward, picking up baby, walking Relieving factors:  massage  PRECAUTIONS: None  WEIGHT BEARING RESTRICTIONS: No  FALLS:  Has patient fallen in last 6 months? No  LIVING ENVIRONMENT: Lives with: lives with their family Lives in: House/apartment  OCCUPATION: does not work outside of the home  PLOF: Independent  PATIENT GOALS: to decrease pain  PERTINENT HISTORY:  G4P3 Sexual abuse: No  BOWEL MOVEMENT: Pain with bowel movement: No Type of bowel movement:Frequency 1x every 3 days  and Strain Yes (sometimes) Fully empty rectum: Yes: - Leakage: No Pads: No Fiber supplement: No  URINATION: Pain with urination: No Fully empty bladder: Yes: - Stream: Strong Urgency: Yes: cannot hold it Frequency: 1.5 hours Leakage: Urge to void, Walking to the bathroom, Coughing, Sneezing, and Laughing Pads: Yes: -  INTERCOURSE: Pain with intercourse:  has not returned to intercourse, but no history of pain   PREGNANCY: Vaginal deliveries 3 Tearing No C-section deliveries 0 Currently pregnant No  PROLAPSE: Report of vaginal bulging   OBJECTIVE:   02/06/22: PATIENT SURVEYS:   PFIQ-7 90  COGNITION: Overall cognitive status: Within functional limits for tasks assessed     SENSATION: Light touch: Appears intact Proprioception: Appears intact  MUSCLE LENGTH: decreased left hip flexor length, very limited bil piriformis/hamstrings  Lumbar special test - SLR and slump positive on Rt side FUNCTIONAL TESTS:  Single leg standing on Rt LE increased pain and trunk lean to the Rt  GAIT: Comments: WNL  POSTURE: rounded shoulders, forward head, increased lumbar lordosis, increased thoracic kyphosis, and anterior pelvic tilt   LUMBARAROM/PROM:  A/PROM A/PROM  Eval (%)  Flexion 75, pain  Extension 50, pain  Right lateral flexion 75  Left lateral flexion 50, pain  Right rotation 50  Left rotation 50, pain   (Blank rows = not tested)   LOWER EXTREMITY MMT: Rt hip flexion 4/5; Rt knee ext 4/5, Rt ankle DF 3/5 - all with  pain; Lt hip 5/5   PALPATION:   General  tenderness throughout lumbar paraspinals bil                External Perineal Exam NA                             Internal Pelvic Floor NA  Patient confirms identification and approves PT to assess internal pelvic floor and treatment Yes will perform in future treatment sessions  PELVIC MMT:   MMT eval  Vaginal   Internal Anal Sphincter   External Anal Sphincter   Puborectalis   Diastasis Recti   (Blank rows = not tested)        TONE: High tone in low back and gluteals  PROLAPSE: NA  TODAY'S TREATMENT:  DATE: 03/14/22                   Manual: Cupping and STM to lumbar paraspinals NMRE Exercises:  Foam roll under back single knee to chest Two pillows under head for engaged core with exhale Ball between knees TC for core and pelvic contraction Very little contracting of pelvic floor    PATIENT EDUCATION:  Education details: education on condition and treatment Person educated: Patient Education method: Explanation, Demonstration, Corporate treasurer cues, Verbal cues, and Handouts Education comprehension: verbalized understanding  HOME EXERCISE PROGRAM: [email or web address] Access Code: [email or web address] URL: [email or web address] Date: 02/06/2022 Prepared by: Jari Favre  Exercises - Seated Pelvic Floor Contraction with Isometric Hip Adduction  - 3 x daily - 7 x weekly - 1 sets - 10 reps - Seated Pelvic Floor Contraction with Hip Abduction and Resistance Loop  - 3 x daily - 7 x weekly - 1 sets - 10 reps - Seated March  - 3 x daily - 7 x weekly - 3 sets - 10 reps - Seated Piriformis Stretch with Trunk Bend  - 3 x daily - 7 x weekly - 1 sets - 2 reps - 60 hold - Seated Hamstring Stretch  - 3 x daily - 7 x weekly - 1 sets - 2 reps - 60 hold - Seated Thoracic Extension with Hands Behind Neck  - 1 x daily - 7  x weekly - 3 sets - 10 reps  ASSESSMENT:  CLINICAL IMPRESSION: Patient here for initial f/u after eval so no goals met at this time.  Pt has been able to do the exercises and that feels good but the pain comes back.  No muscle lifting in pelvic floor was felt when doing assessment.  Pt gets stiffness easily when lying down but worked out once she moves. Pt had improved transversus abdominus activity when head slightly elevated to reduce arching in back.  Pt will benefit from skilled PT to continue to progress posture strength and assess for pelvic floor impairments  OBJECTIVE IMPAIRMENTS: decreased coordination, decreased endurance, decreased mobility, decreased ROM, decreased strength, impaired tone, postural dysfunction, and pain.   ACTIVITY LIMITATIONS: carrying, lifting, bending, sitting, standing, squatting, transfers, bed mobility, continence, and locomotion level  PARTICIPATION LIMITATIONS: community activity  PERSONAL FACTORS: 1 comorbidity: G4P3  are also affecting patient's functional outcome.   REHAB POTENTIAL: Good  CLINICAL DECISION MAKING: Stable/uncomplicated  EVALUATION COMPLEXITY: Low   GOALS: Goals reviewed with patient? Yes  SHORT TERM GOALS: Target date: 03/06/2022 Updated 03/14/22  Pt will be independent with HEP.   Baseline: Goal status: MET  2.  Pt will be independent with the knack, urge suppression technique, and double voiding in order to improve bladder habits and decrease urinary incontinence.   Baseline:  Goal status: INITIAL  3.  Pt will be able to correctly perform diaphragmatic breathing and appropriate pressure management in order to prevent worsening vaginal wall laxity and improve pelvic floor A/ROM.   Baseline:  Goal status: INITIAL    LONG TERM GOALS: Target date: 05/01/2022   Pt will be independent with advanced HEP.   Baseline:  Goal status: INITIAL  2.  Pt will demonstrate normal pelvic floor muscle tone and A/ROM, able to achieve  3/5 strength with contractions and 10 sec endurance, in order to provide appropriate lumbopelvic support in functional activities.   Baseline:  Goal status: INITIAL  3.  Pt will be able to go 2-3 hours in between voids without urgency or incontinence in order  to improve QOL and perform all functional activities with less difficulty.   Baseline:  Goal status: INITIAL  4.  Pt will report no episodes of urinary incontinence in order to improve confidence in community activities and personal hygiene.   Baseline:  Goal status: INITIAL  5.  Pt will report no greater than 2/10 low back pain with any activity, especially ability to pick up baby, bend forward, bed mob/ility, and prolonged sitting.  Baseline:  Goal status: INITIAL  6.  Pt will improve PFIQ score by 50% or more.  Baseline: 90 Goal status: INITIAL  PLAN:  PT FREQUENCY: 1x/week  PT DURATION: 12 weeks  PLANNED INTERVENTIONS: Therapeutic exercises, Therapeutic activity, Neuromuscular re-education, Balance training, Gait training, Patient/Family education, Self Care, Joint mobilization, Dry Needling, Biofeedback, and Manual therapy  PLAN FOR NEXT SESSION: assess pelvic floor, continue posture strengthening, assess pelvic alignment   Gustavus Bryant, PT, DPT 03/14/22 11:50 AM

## 2022-03-20 ENCOUNTER — Encounter: Payer: Self-pay | Admitting: Physical Therapy

## 2022-03-20 ENCOUNTER — Ambulatory Visit: Payer: Medicaid Other | Admitting: Physical Therapy

## 2022-03-20 DIAGNOSIS — R293 Abnormal posture: Secondary | ICD-10-CM | POA: Diagnosis not present

## 2022-03-20 DIAGNOSIS — M6281 Muscle weakness (generalized): Secondary | ICD-10-CM | POA: Diagnosis not present

## 2022-03-20 DIAGNOSIS — M62838 Other muscle spasm: Secondary | ICD-10-CM

## 2022-03-20 DIAGNOSIS — R279 Unspecified lack of coordination: Secondary | ICD-10-CM

## 2022-03-20 NOTE — Therapy (Signed)
OUTPATIENT PHYSICAL THERAPY FEMALE PELVIC TREATMENT   Patient Name: Heather Savage MRN: [id number] DOB:March 20, 1990, 32 y.o., female Today's Date: 03/20/2022   PT End of Session - 03/20/22 1150     Visit Number 3    Date for PT Re-Evaluation 05/01/22    Authorization Type healthy blue auth until 3/8    PT Start Time 1148    PT Stop Time 1230    PT Time Calculation (min) 42 min    Activity Tolerance Patient tolerated treatment well    Behavior During Therapy WFL for tasks assessed/performed                Past Medical History:  Diagnosis Date   Anemia    states has low blood count, no current meds.   Hearing loss    right   History of gastritis    no current med.   History of gestational diabetes 07/16/2018   Normal postpartum GTT   History of malaria 2012   Preterm labor    Rh negative, maternal    Tympanic membrane perforation 05/2012   right   Past Surgical History:  Procedure Laterality Date   CHOLECYSTECTOMY N/A 09/27/2018   Procedure: LAPAROSCOPIC CHOLECYSTECTOMY WITH INTRAOPERATIVE CHOLANGIOGRAM;  Surgeon: Ileana Roup, MD;  Location: [facility];  Service: General;  Laterality: N/A;   ERCP N/A 09/28/2018   Procedure: ENDOSCOPIC RETROGRADE CHOLANGIOPANCREATOGRAPHY (ERCP);  Surgeon: Ladene Artist, MD;  Location: [facility];  Service: Endoscopy;  Laterality: N/A;   EXTERNAL EAR SURGERY     REMOVAL OF STONES  09/28/2018   Procedure: REMOVAL OF STONES;  Surgeon: Ladene Artist, MD;  Location: [facility];  Service: Endoscopy;;   SCAR REVISION  08/27/2011   Procedure: SCAR REVISION;  Surgeon: Cristine Polio, MD;  Location: [place];  Service: Plastics;  Laterality: N/A;  scar revision of forehead   SPHINCTEROTOMY  09/28/2018   Procedure: SPHINCTEROTOMY;  Surgeon: Ladene Artist, MD;  Location: [facility];  Service: Endoscopy;;   TYMPANOPLASTY  05/14/2011   Procedure: TYMPANOPLASTY;  Surgeon: Izora Gala, MD;  Location: [place];  Service: ENT;  Laterality: Left;  TYMPANOPLASYT AND  POSSIBLE OSSICULARPLASTY    TYMPANOPLASTY  10/10/2011   Procedure: TYMPANOPLASTY;  Surgeon: Izora Gala, MD;  Location: [place];  Service: ENT;  Laterality: Right;   TYMPANOPLASTY Right 06/30/2012   Procedure: REVISION RIGHT TYMPANOPLASTY;  Surgeon: Izora Gala, MD;  Location: [place];  Service: ENT;  Laterality: Right;   Patient Active Problem List   Diagnosis Date Noted   ASCUS of cervix with negative high risk HPV 11/12/2021   Encounter for induction of labor 09/23/2021   Gestational diabetes 07/18/2021   Supervision of normal pregnancy 03/10/2021   S/P laparoscopic cholecystectomy 09/27/2018   History of gestational diabetes 07/16/2018   Rh negative, maternal 02/08/2016   Language barrier 01/02/2016   Hearing loss 05/01/2011    PCP: None  REFERRING PROVIDER: Elvera Maria, CNM  REFERRING DIAG:  M62.89 (ICD-10-CM) - Pelvic floor dysfunction in female  M54.6 (ICD-10-CM) - Acute bilateral thoracic back pain      THERAPY DIAG:  Abnormal posture  Muscle weakness (generalized)  Unspecified lack of coordination  Other muscle spasm  Rationale for Evaluation and Treatment: Rehabilitation  ONSET DATE: 9 month ago  SUBJECTIVE:  SUBJECTIVE STATEMENT: Pt states she is still having leakage.  Still doing exercises Fluid intake: Yes: not assessed this session    PAIN:  Are you having pain? Yes NPRS scale: 6/10 Pain location: low back pain on right side  Pain type: tight Pain description: intermittent   Aggravating factors: prolonged sitting, sleeping on back, bending forward, picking up baby, walking Relieving factors: massage  PRECAUTIONS: None  WEIGHT BEARING RESTRICTIONS: No  FALLS:  Has  patient fallen in last 6 months? No  LIVING ENVIRONMENT: Lives with: lives with their family Lives in: House/apartment  OCCUPATION: does not work outside of the home  PLOF: Independent  PATIENT GOALS: to decrease pain  PERTINENT HISTORY:  G4P3 Sexual abuse: No  BOWEL MOVEMENT: Pain with bowel movement: No Type of bowel movement:Frequency 1x every 3 days  and Strain Yes (sometimes) Fully empty rectum: Yes: - Leakage: No Pads: No Fiber supplement: No  URINATION: Pain with urination: No Fully empty bladder: Yes: - Stream: Strong Urgency: Yes: cannot hold it Frequency: 1.5 hours Leakage: Urge to void, Walking to the bathroom, Coughing, Sneezing, and Laughing Pads: Yes: -  INTERCOURSE: Pain with intercourse:  has not returned to intercourse, but no history of pain   PREGNANCY: Vaginal deliveries 3 Tearing No C-section deliveries 0 Currently pregnant No  PROLAPSE: Report of vaginal bulging   OBJECTIVE:   02/06/22: PATIENT SURVEYS:   PFIQ-7 90  COGNITION: Overall cognitive status: Within functional limits for tasks assessed     SENSATION: Light touch: Appears intact Proprioception: Appears intact  MUSCLE LENGTH: decreased left hip flexor length, very limited bil piriformis/hamstrings  Lumbar special test - SLR and slump positive on Rt side FUNCTIONAL TESTS:  Single leg standing on Rt LE increased pain and trunk lean to the Rt  GAIT: Comments: WNL  POSTURE: rounded shoulders, forward head, increased lumbar lordosis, increased thoracic kyphosis, and anterior pelvic tilt   LUMBARAROM/PROM:  A/PROM A/PROM  Eval (%)  Flexion 75, pain  Extension 50, pain  Right lateral flexion 75  Left lateral flexion 50, pain  Right rotation 50  Left rotation 50, pain   (Blank rows = not tested)   LOWER EXTREMITY MMT: Rt hip flexion 4/5; Rt knee ext 4/5, Rt ankle DF 3/5 - all with pain; Lt hip 5/5   PALPATION:   General  tenderness throughout lumbar  paraspinals bil                External Perineal Exam NA                             Internal Pelvic Floor difficulty with knack, feels like  Patient confirms identification and approves PT to assess internal pelvic floor and treatment Yes will perform in future treatment sessions  PELVIC MMT:   MMT 03/20/22   Vaginal 2/5 2 reps; 1 sec hold  Internal Anal Sphincter   External Anal Sphincter   Puborectalis   Diastasis Recti   (Blank rows = not tested)        TONE: High tone in low back and gluteals  PROLAPSE: NA  TODAY'S TREATMENT:  DATE: 03/20/22                   Manual: Patient confirms identification and approves physical therapist to perform internal soft tissue work  - left obturator and ileococcygeus Lumbar and thoracic paraspinals Trigger Point Dry-Needling  Treatment instructions: Expect mild to moderate muscle soreness. S/S of pneumothorax if dry needled over a lung field, and to seek immediate medical attention should they occur. Patient verbalized understanding of these instructions and education.  Patient Consent Given: Yes Education handout provided: Yes Muscles treated: lumar multifidi Electrical stimulation performed: No Parameters: N/A Treatment response/outcome: increased soft tissue length  NMRE Exercises:  Exhale with exertion and engaging core - supine, pelvic tilt, ball squeeze - hand behind back for cues    PATIENT EDUCATION:  Education details: education on condition and treatment Person educated: Patient Education method: Explanation, Demonstration, Tactile cues, Verbal cues, and Handouts Education comprehension: verbalized understanding  HOME EXERCISE PROGRAM: [email or web address] Access Code: [email or web address] URL: [email or web address] Date: 02/06/2022 Prepared by: Jari Favre  Exercises - Seated Pelvic  Floor Contraction with Isometric Hip Adduction  - 3 x daily - 7 x weekly - 1 sets - 10 reps - Seated Pelvic Floor Contraction with Hip Abduction and Resistance Loop  - 3 x daily - 7 x weekly - 1 sets - 10 reps - Seated March  - 3 x daily - 7 x weekly - 3 sets - 10 reps - Seated Piriformis Stretch with Trunk Bend  - 3 x daily - 7 x weekly - 1 sets - 2 reps - 60 hold - Seated Hamstring Stretch  - 3 x daily - 7 x weekly - 1 sets - 2 reps - 60 hold - Seated Thoracic Extension with Hands Behind Neck  - 1 x daily - 7 x weekly - 3 sets - 10 reps Dry needling education ASSESSMENT:  CLINICAL IMPRESSION: Todays session focused on improved lumbar mobility and decreased muscle spasm. Pt gets better pelvic floor contraction and less back pain with cues and exhale on exertion  Pt will need skilled PT to continue working on these muscles imbalances as she still requires moderate to maximum cues.  OBJECTIVE IMPAIRMENTS: decreased coordination, decreased endurance, decreased mobility, decreased ROM, decreased strength, impaired tone, postural dysfunction, and pain.   ACTIVITY LIMITATIONS: carrying, lifting, bending, sitting, standing, squatting, transfers, bed mobility, continence, and locomotion level  PARTICIPATION LIMITATIONS: community activity  PERSONAL FACTORS: 1 comorbidity: G4P3  are also affecting patient's functional outcome.   REHAB POTENTIAL: Good  CLINICAL DECISION MAKING: Stable/uncomplicated  EVALUATION COMPLEXITY: Low   GOALS: Goals reviewed with patient? Yes  SHORT TERM GOALS: Target date: 03/06/2022 Updated 03/14/22  Pt will be independent with HEP.   Baseline: Goal status: MET  2.  Pt will be independent with the knack, urge suppression technique, and double voiding in order to improve bladder habits and decrease urinary incontinence.   Baseline:  Goal status: IN PROGRESS  3.  Pt will be able to correctly perform diaphragmatic breathing and appropriate pressure management in  order to prevent worsening vaginal wall laxity and improve pelvic floor A/ROM.   Baseline:  Goal status: IN PROGRESS    LONG TERM GOALS: Target date: 05/01/2022   Pt will be independent with advanced HEP.   Baseline:  Goal status: IN PROGRESS  2.  Pt will demonstrate normal pelvic floor muscle tone and A/ROM, able to achieve 3/5 strength with contractions and 10 sec endurance, in order to provide appropriate lumbopelvic support in  functional activities.   Baseline:  Goal status: IN PROGRESS  3.  Pt will be able to go 2-3 hours in between voids without urgency or incontinence in order to improve QOL and perform all functional activities with less difficulty.   Baseline:  Goal status: IN PROGRESS  4.  Pt will report no episodes of urinary incontinence in order to improve confidence in community activities and personal hygiene.   Baseline:  Goal status: IN PROGRESS  5.  Pt will report no greater than 2/10 low back pain with any activity, especially ability to pick up baby, bend forward, bed mob/ility, and prolonged sitting.  Baseline:  Goal status: IN PROGRESS  6.  Pt will improve PFIQ score by 50% or more.  Baseline: 90 Goal status: IN PROGRESS  PLAN:  PT FREQUENCY: 1x/week  PT DURATION: 12 weeks  PLANNED INTERVENTIONS: Therapeutic exercises, Therapeutic activity, Neuromuscular re-education, Balance training, Gait training, Patient/Family education, Self Care, Joint mobilization, Dry Needling, Biofeedback, and Manual therapy  PLAN FOR NEXT SESSION: pressure management with exhale and release lumbar spasms, f/u on dry needling #1  Gustavus Bryant, PT, DPT 03/20/22 11:55 AM

## 2022-03-26 ENCOUNTER — Encounter: Payer: Self-pay | Admitting: Physical Therapy

## 2022-03-26 ENCOUNTER — Ambulatory Visit: Payer: Medicaid Other | Admitting: Physical Therapy

## 2022-03-26 DIAGNOSIS — M6281 Muscle weakness (generalized): Secondary | ICD-10-CM

## 2022-03-26 DIAGNOSIS — M62838 Other muscle spasm: Secondary | ICD-10-CM | POA: Diagnosis not present

## 2022-03-26 DIAGNOSIS — R279 Unspecified lack of coordination: Secondary | ICD-10-CM

## 2022-03-26 DIAGNOSIS — R293 Abnormal posture: Secondary | ICD-10-CM | POA: Diagnosis not present

## 2022-03-26 NOTE — Therapy (Signed)
OUTPATIENT PHYSICAL THERAPY FEMALE PELVIC TREATMENT   Patient Name: Heather Savage MRN: [id number] DOB:[birth year], 32 y.o., female Today's Date: 03/26/2022   PT End of Session - 03/26/22 1145     Visit Number 4    Date for PT Re-Evaluation 05/01/22    Authorization Type healthy blue auth 7 until 3/8    Authorization - Visit Number 3    Authorization - Number of Visits 7    PT Start Time [id number]    PT Stop Time 1225    PT Time Calculation (min) 40 min    Activity Tolerance Patient tolerated treatment well    Behavior During Therapy WFL for tasks assessed/performed                 Past Medical History:  Diagnosis Date   Anemia    states has low blood count, no current meds.   Hearing loss    right   History of gastritis    no current med.   History of gestational diabetes 07/16/2018   Normal postpartum GTT   History of malaria 2012   Preterm labor    Rh negative, maternal    Tympanic membrane perforation 05/2012   right   Past Surgical History:  Procedure Laterality Date   CHOLECYSTECTOMY N/A 09/27/2018   Procedure: LAPAROSCOPIC CHOLECYSTECTOMY WITH INTRAOPERATIVE CHOLANGIOGRAM;  Surgeon: Ileana Roup, MD;  Location: [facility];  Service: General;  Laterality: N/A;   ERCP N/A 09/28/2018   Procedure: ENDOSCOPIC RETROGRADE CHOLANGIOPANCREATOGRAPHY (ERCP);  Surgeon: Ladene Artist, MD;  Location: [facility];  Service: Endoscopy;  Laterality: N/A;   EXTERNAL EAR SURGERY     REMOVAL OF STONES  09/28/2018   Procedure: REMOVAL OF STONES;  Surgeon: Ladene Artist, MD;  Location: [facility];  Service: Endoscopy;;   SCAR REVISION  08/27/2011   Procedure: SCAR REVISION;  Surgeon: Cristine Polio, MD;  Location: [place];  Service: Plastics;  Laterality: N/A;  scar revision of forehead   SPHINCTEROTOMY  09/28/2018   Procedure: SPHINCTEROTOMY;  Surgeon: Ladene Artist, MD;  Location: [facility];  Service: Endoscopy;;   TYMPANOPLASTY  05/14/2011    Procedure: TYMPANOPLASTY;  Surgeon: Izora Gala, MD;  Location: [place];  Service: ENT;  Laterality: Left;  TYMPANOPLASYT AND  POSSIBLE OSSICULARPLASTY    TYMPANOPLASTY  10/10/2011   Procedure: TYMPANOPLASTY;  Surgeon: Izora Gala, MD;  Location: [place];  Service: ENT;  Laterality: Right;   TYMPANOPLASTY Right 06/30/2012   Procedure: REVISION RIGHT TYMPANOPLASTY;  Surgeon: Izora Gala, MD;  Location: [place];  Service: ENT;  Laterality: Right;   Patient Active Problem List   Diagnosis Date Noted   ASCUS of cervix with negative high risk HPV 11/12/2021   Encounter for induction of labor 09/23/2021   Gestational diabetes 07/18/2021   Supervision of normal pregnancy 03/10/2021   S/P laparoscopic cholecystectomy 09/27/2018   History of gestational diabetes 07/16/2018   Rh negative, maternal 02/08/2016   Language barrier 01/02/2016   Hearing loss 05/01/2011    PCP: None  REFERRING PROVIDER: Elvera Maria, CNM  REFERRING DIAG:  M62.89 (ICD-10-CM) - Pelvic floor dysfunction in female  M54.6 (ICD-10-CM) - Acute bilateral thoracic back pain      THERAPY DIAG:  Abnormal posture  Muscle weakness (generalized)  Unspecified lack of coordination  Other muscle spasm  Rationale for Evaluation and Treatment: Rehabilitation  ONSET DATE: 9 month ago  SUBJECTIVE:  SUBJECTIVE STATEMENT: Pt states she is still having leakage.  Still doing exercises Fluid intake: Yes: not assessed this session  interpreters [phone], [phone]  PAIN:  Are you having pain? Yes NPRS scale: 6/10 Pain location: low back pain on right side  Pain type: tight Pain description: intermittent   Aggravating factors: prolonged sitting, sleeping on back, bending forward, picking up baby,  walking Relieving factors: massage  PRECAUTIONS: None  WEIGHT BEARING RESTRICTIONS: No  FALLS:  Has patient fallen in last 6 months? No  LIVING ENVIRONMENT: Lives with: lives with their family Lives in: House/apartment  OCCUPATION: does not work outside of the home  PLOF: Independent  PATIENT GOALS: to decrease pain  PERTINENT HISTORY:  G4P3 Sexual abuse: No  BOWEL MOVEMENT: Pain with bowel movement: No Type of bowel movement:Frequency 1x every 3 days  and Strain Yes (sometimes) Fully empty rectum: Yes: - Leakage: No Pads: No Fiber supplement: No  URINATION: Pain with urination: No Fully empty bladder: Yes: - Stream: Strong Urgency: Yes: cannot hold it Frequency: 1.5 hours Leakage: Urge to void, Walking to the bathroom, Coughing, Sneezing, and Laughing Pads: Yes: -  INTERCOURSE: Pain with intercourse:  has not returned to intercourse, but no history of pain   PREGNANCY: Vaginal deliveries 3 Tearing No C-section deliveries 0 Currently pregnant No  PROLAPSE: Report of vaginal bulging   OBJECTIVE:   02/06/22: PATIENT SURVEYS:   PFIQ-7 90  COGNITION: Overall cognitive status: Within functional limits for tasks assessed     SENSATION: Light touch: Appears intact Proprioception: Appears intact  MUSCLE LENGTH: decreased left hip flexor length, very limited bil piriformis/hamstrings  Lumbar special test - SLR and slump positive on Rt side FUNCTIONAL TESTS:  Single leg standing on Rt LE increased pain and trunk lean to the Rt  GAIT: Comments: WNL  POSTURE: rounded shoulders, forward head, increased lumbar lordosis, increased thoracic kyphosis, and anterior pelvic tilt   LUMBARAROM/PROM:  A/PROM A/PROM  Eval (%)  Flexion 75, pain  Extension 50, pain  Right lateral flexion 75  Left lateral flexion 50, pain  Right rotation 50  Left rotation 50, pain   (Blank rows = not tested)   LOWER EXTREMITY MMT: Rt hip flexion 4/5; Rt knee ext 4/5,  Rt ankle DF 3/5 - all with pain; Lt hip 5/5   PALPATION:   General  tenderness throughout lumbar paraspinals bil                External Perineal Exam NA                             Internal Pelvic Floor difficulty with knack, feels like  Patient confirms identification and approves PT to assess internal pelvic floor and treatment Yes will perform in future treatment sessions  PELVIC MMT:   MMT 03/20/22   Vaginal 2/5 2 reps; 1 sec hold  Internal Anal Sphincter   External Anal Sphincter   Puborectalis   Diastasis Recti   (Blank rows = not tested)        TONE: High tone in low back and gluteals  PROLAPSE: NA  TODAY'S TREATMENT:  DATE: 03/26/22                   Manual:  Lumbar and thoracic paraspinals Trigger Point Dry-Needling  Treatment instructions: Expect mild to moderate muscle soreness. S/S of pneumothorax if dry needled over a lung field, and to seek immediate medical attention should they occur. Patient verbalized understanding of these instructions and education.  Patient Consent Given: Yes Education handout provided: Yes Muscles treated: L1 lumar multifidi Electrical stimulation performed: No Parameters: N/A Treatment response/outcome: increased soft tissue length  NMRE Exercises:  Exhale with exertion and engaging core - supine, gentle ball squeeze ball squeeze - seated gentle squeeze adding arms  TherAct: Urge techniques   PATIENT EDUCATION:  Education details: education on condition and treatment Person educated: Patient Education method: Explanation, Demonstration, Tactile cues, Verbal cues, and Handouts Education comprehension: verbalized understanding  HOME EXERCISE PROGRAM: [email or web address] Access Code: [email or web address] URL: [email or web address] Date: 02/06/2022 Prepared by: Jari Favre  Exercises - Seated  Pelvic Floor Contraction with Isometric Hip Adduction  - 3 x daily - 7 x weekly - 1 sets - 10 reps - Seated Pelvic Floor Contraction with Hip Abduction and Resistance Loop  - 3 x daily - 7 x weekly - 1 sets - 10 reps - Seated March  - 3 x daily - 7 x weekly - 3 sets - 10 reps - Seated Piriformis Stretch with Trunk Bend  - 3 x daily - 7 x weekly - 1 sets - 2 reps - 60 hold - Seated Hamstring Stretch  - 3 x daily - 7 x weekly - 1 sets - 2 reps - 60 hold - Seated Thoracic Extension with Hands Behind Neck  - 1 x daily - 7 x weekly - 3 sets - 10 reps Dry needling education ASSESSMENT:  CLINICAL IMPRESSION: Todays session focused on progression of core strength. Pt had better contraction and able to feel pelvic floor muscle. Pt having much less back pain with the dry needling. Pt will benefit from skilled PT as she is still having a hard time with coordinating core and exhale when adding UE movements. Also educated and understands urge techniques  OBJECTIVE IMPAIRMENTS: decreased coordination, decreased endurance, decreased mobility, decreased ROM, decreased strength, impaired tone, postural dysfunction, and pain.   ACTIVITY LIMITATIONS: carrying, lifting, bending, sitting, standing, squatting, transfers, bed mobility, continence, and locomotion level  PARTICIPATION LIMITATIONS: community activity  PERSONAL FACTORS: 1 comorbidity: G4P3  are also affecting patient's functional outcome.   REHAB POTENTIAL: Good  CLINICAL DECISION MAKING: Stable/uncomplicated  EVALUATION COMPLEXITY: Low   GOALS: Goals reviewed with patient? Yes  SHORT TERM GOALS: Target date: 03/06/2022 Updated 03/14/22  Pt will be independent with HEP.   Baseline: Goal status: MET  2.  Pt will be independent with the knack, urge suppression technique, and double voiding in order to improve bladder habits and decrease urinary incontinence.   Baseline:  Goal status: MET  3.  Pt will be able to correctly perform  diaphragmatic breathing and appropriate pressure management in order to prevent worsening vaginal wall laxity and improve pelvic floor A/ROM.   Baseline:  Goal status: MET    LONG TERM GOALS: Target date: 05/01/2022   Pt will be independent with advanced HEP.   Baseline:  Goal status: IN PROGRESS  2.  Pt will demonstrate normal pelvic floor muscle tone and A/ROM, able to achieve 3/5 strength with contractions and 10 sec endurance, in order to provide appropriate lumbopelvic support in functional activities.   Baseline:  Goal status: IN PROGRESS  3.  Pt will be able to go 2-3 hours in between voids without urgency or incontinence in order to improve QOL and perform all functional activities with less difficulty.   Baseline:  Goal status: IN PROGRESS  4.  Pt will report no episodes of urinary incontinence in order to improve confidence in community activities and personal hygiene.   Baseline:  Goal status: IN PROGRESS  5.  Pt will report no greater than 2/10 low back pain with any activity, especially ability to pick up baby, bend forward, bed mob/ility, and prolonged sitting.  Baseline:  Goal status: IN PROGRESS  6.  Pt will improve PFIQ score by 50% or more.  Baseline: 90 Goal status: IN PROGRESS  PLAN:  PT FREQUENCY: 1x/week  PT DURATION: 12 weeks  PLANNED INTERVENTIONS: Therapeutic exercises, Therapeutic activity, Neuromuscular re-education, Balance training, Gait training, Patient/Family education, Self Care, Joint mobilization, Dry Needling, Biofeedback, and Manual therapy  PLAN FOR NEXT SESSION: pressure management with exhale and release lumbar spasms, f/u on dry needling #2 and urgency  Gustavus Bryant, PT, DPT 03/26/22 11:45 AM

## 2022-04-02 ENCOUNTER — Ambulatory Visit: Payer: Medicaid Other | Attending: Advanced Practice Midwife | Admitting: Physical Therapy

## 2022-04-02 DIAGNOSIS — M62838 Other muscle spasm: Secondary | ICD-10-CM | POA: Diagnosis not present

## 2022-04-02 DIAGNOSIS — R293 Abnormal posture: Secondary | ICD-10-CM | POA: Diagnosis not present

## 2022-04-02 DIAGNOSIS — R279 Unspecified lack of coordination: Secondary | ICD-10-CM | POA: Diagnosis not present

## 2022-04-02 DIAGNOSIS — M6281 Muscle weakness (generalized): Secondary | ICD-10-CM | POA: Insufficient documentation

## 2022-04-02 NOTE — Therapy (Signed)
OUTPATIENT PHYSICAL THERAPY FEMALE PELVIC TREATMENT   Patient Name: Heather Savage MRN: [id number] DOB:08/22/1990, 32 y.o., female Today's Date: 04/02/2022   PT End of Session - 04/02/22 1022     Visit Number 5    Date for PT Re-Evaluation 05/01/22    Authorization Type healthy blue auth 7 until 3/8    Authorization - Visit Number 4    Authorization - Number of Visits 7    PT Start Time 1017    PT Stop Time 1100    PT Time Calculation (min) 43 min    Activity Tolerance Patient tolerated treatment well    Behavior During Therapy WFL for tasks assessed/performed                  Past Medical History:  Diagnosis Date   Anemia    states has low blood count, no current meds.   Hearing loss    right   History of gastritis    no current med.   History of gestational diabetes 07/16/2018   Normal postpartum GTT   History of malaria 2012   Preterm labor    Rh negative, maternal    Tympanic membrane perforation 05/2012   right   Past Surgical History:  Procedure Laterality Date   CHOLECYSTECTOMY N/A 09/27/2018   Procedure: LAPAROSCOPIC CHOLECYSTECTOMY WITH INTRAOPERATIVE CHOLANGIOGRAM;  Surgeon: Ileana Roup, MD;  Location: [facility];  Service: General;  Laterality: N/A;   ERCP N/A 09/28/2018   Procedure: ENDOSCOPIC RETROGRADE CHOLANGIOPANCREATOGRAPHY (ERCP);  Surgeon: Ladene Artist, MD;  Location: [facility];  Service: Endoscopy;  Laterality: N/A;   EXTERNAL EAR SURGERY     REMOVAL OF STONES  09/28/2018   Procedure: REMOVAL OF STONES;  Surgeon: Ladene Artist, MD;  Location: [facility];  Service: Endoscopy;;   SCAR REVISION  08/27/2011   Procedure: SCAR REVISION;  Surgeon: Cristine Polio, MD;  Location: [place];  Service: Plastics;  Laterality: N/A;  scar revision of forehead   SPHINCTEROTOMY  09/28/2018   Procedure: SPHINCTEROTOMY;  Surgeon: Ladene Artist, MD;  Location: [facility];  Service: Endoscopy;;   TYMPANOPLASTY  05/14/2011    Procedure: TYMPANOPLASTY;  Surgeon: Izora Gala, MD;  Location: [place];  Service: ENT;  Laterality: Left;  TYMPANOPLASYT AND  POSSIBLE OSSICULARPLASTY    TYMPANOPLASTY  10/10/2011   Procedure: TYMPANOPLASTY;  Surgeon: Izora Gala, MD;  Location: [place];  Service: ENT;  Laterality: Right;   TYMPANOPLASTY Right 06/30/2012   Procedure: REVISION RIGHT TYMPANOPLASTY;  Surgeon: Izora Gala, MD;  Location: [place];  Service: ENT;  Laterality: Right;   Patient Active Problem List   Diagnosis Date Noted   ASCUS of cervix with negative high risk HPV 11/12/2021   Encounter for induction of labor 09/23/2021   Gestational diabetes 07/18/2021   Supervision of normal pregnancy 03/10/2021   S/P laparoscopic cholecystectomy 09/27/2018   History of gestational diabetes 07/16/2018   Rh negative, maternal 02/08/2016   Language barrier 01/02/2016   Hearing loss 05/01/2011    PCP: None  REFERRING PROVIDER: Elvera Maria, CNM  REFERRING DIAG:  M62.89 (ICD-10-CM) - Pelvic floor dysfunction in female  M54.6 (ICD-10-CM) - Acute bilateral thoracic back pain      THERAPY DIAG:  Other muscle spasm  Abnormal posture  Muscle weakness (generalized)  Unspecified lack of coordination  Rationale for Evaluation and Treatment: Rehabilitation  ONSET DATE: 9 month ago  SUBJECTIVE:  SUBJECTIVE STATEMENT: Pt states she is still having leakage.  Still doing exercises, not having pain. Leakage is when I have an urge to go and just a little bit Fluid intake: Yes: not assessed this session  interpreters [phone], [phone]  PAIN:  PAIN:  Are you having pain? No    PRECAUTIONS: None  WEIGHT BEARING RESTRICTIONS: No  FALLS:  Has patient fallen in last 6 months? No  LIVING  ENVIRONMENT: Lives with: lives with their family Lives in: House/apartment  OCCUPATION: does not work outside of the home  PLOF: Independent  PATIENT GOALS: to decrease pain  PERTINENT HISTORY:  G4P3 Sexual abuse: No  BOWEL MOVEMENT: Pain with bowel movement: No Type of bowel movement:Frequency 1x every 3 days  and Strain Yes (sometimes) Fully empty rectum: Yes: - Leakage: No Pads: No Fiber supplement: No  URINATION: Pain with urination: No Fully empty bladder: Yes: - Stream: Strong Urgency: Yes: cannot hold it Frequency: 1.5 hours Leakage: Urge to void, Walking to the bathroom, Coughing, Sneezing, and Laughing Pads: Yes: -  INTERCOURSE: Pain with intercourse:  has not returned to intercourse, but no history of pain   PREGNANCY: Vaginal deliveries 3 Tearing No C-section deliveries 0 Currently pregnant No  PROLAPSE: Report of vaginal bulging   OBJECTIVE:   02/06/22: PATIENT SURVEYS:   PFIQ-7 90  COGNITION: Overall cognitive status: Within functional limits for tasks assessed     SENSATION: Light touch: Appears intact Proprioception: Appears intact  MUSCLE LENGTH: decreased left hip flexor length, very limited bil piriformis/hamstrings  Lumbar special test - SLR and slump positive on Rt side FUNCTIONAL TESTS:  Single leg standing on Rt LE increased pain and trunk lean to the Rt  GAIT: Comments: WNL  POSTURE: rounded shoulders, forward head, increased lumbar lordosis, increased thoracic kyphosis, and anterior pelvic tilt   LUMBARAROM/PROM:  A/PROM A/PROM  Eval (%)  Flexion 75, pain  Extension 50, pain  Right lateral flexion 75  Left lateral flexion 50, pain  Right rotation 50  Left rotation 50, pain   (Blank rows = not tested)   LOWER EXTREMITY MMT: Rt hip flexion 4/5; Rt knee ext 4/5, Rt ankle DF 3/5 - all with pain; Lt hip 5/5   PALPATION:   General  tenderness throughout lumbar paraspinals bil                External Perineal  Exam NA                             Internal Pelvic Floor difficulty with knack, feels like  Patient confirms identification and approves PT to assess internal pelvic floor and treatment Yes will perform in future treatment sessions  PELVIC MMT:   MMT 03/20/22   Vaginal 2/5 2 reps; 1 sec hold  Internal Anal Sphincter   External Anal Sphincter   Puborectalis   Diastasis Recti   (Blank rows = not tested)        TONE: High tone in low back and gluteals  PROLAPSE: NA  TODAY'S TREATMENT:  DATE: 04/02/22                   NMRE Exercises:  Exhale with exertion and engaging core - standing with pallof press green band, row green band, ex , standing at the wall breathing with core and keep spine neutral, mini bridge with ball squeeze, side lying leg lift, core  engage with supine abduction green loop, shoulder abduction supine green band    PATIENT EDUCATION:  Education details: education on condition and treatment Person educated: Patient Education method: Explanation, Demonstration, Corporate treasurer cues, Verbal cues, and Handouts Education comprehension: verbalized understanding  HOME EXERCISE PROGRAM: [email or web address] Access Code: [email or web address] URL: [email or web address] Date: 02/06/2022 Prepared by: Jari Favre  Exercises - Seated Pelvic Floor Contraction with Isometric Hip Adduction  - 3 x daily - 7 x weekly - 1 sets - 10 reps - Seated Pelvic Floor Contraction with Hip Abduction and Resistance Loop  - 3 x daily - 7 x weekly - 1 sets - 10 reps - Seated March  - 3 x daily - 7 x weekly - 3 sets - 10 reps - Seated Piriformis Stretch with Trunk Bend  - 3 x daily - 7 x weekly - 1 sets - 2 reps - 60 hold - Seated Hamstring Stretch  - 3 x daily - 7 x weekly - 1 sets - 2 reps - 60 hold - Seated Thoracic Extension with Hands Behind Neck  - 1 x daily - 7 x  weekly - 3 sets - 10 reps Dry needling education ASSESSMENT:  CLINICAL IMPRESSION: Todays session focused on posture and exhale with exertion.  Pt still needing a lot of TC and verbal cues to do exercises with coordination and keep spine neutral.  Able to give more challenging exercises but still on the mat for HEP.  Pt will benefit from skilled PT to continue to address strength for bladder control.  OBJECTIVE IMPAIRMENTS: decreased coordination, decreased endurance, decreased mobility, decreased ROM, decreased strength, impaired tone, postural dysfunction, and pain.   ACTIVITY LIMITATIONS: carrying, lifting, bending, sitting, standing, squatting, transfers, bed mobility, continence, and locomotion level  PARTICIPATION LIMITATIONS: community activity  PERSONAL FACTORS: 1 comorbidity: G4P3  are also affecting patient's functional outcome.   REHAB POTENTIAL: Good  CLINICAL DECISION MAKING: Stable/uncomplicated  EVALUATION COMPLEXITY: Low   GOALS: Goals reviewed with patient? Yes  SHORT TERM GOALS: Target date: 03/06/2022 Updated 03/14/22  Pt will be independent with HEP.   Baseline: Goal status: MET  2.  Pt will be independent with the knack, urge suppression technique, and double voiding in order to improve bladder habits and decrease urinary incontinence.   Baseline:  Goal status: MET  3.  Pt will be able to correctly perform diaphragmatic breathing and appropriate pressure management in order to prevent worsening vaginal wall laxity and improve pelvic floor A/ROM.   Baseline:  Goal status: MET    LONG TERM GOALS: Target date: 05/01/2022   Pt will be independent with advanced HEP.   Baseline:  Goal status: IN PROGRESS  2.  Pt will demonstrate normal pelvic floor muscle tone and A/ROM, able to achieve 3/5 strength with contractions and 10 sec endurance, in order to provide appropriate lumbopelvic support in functional activities.   Baseline:  Goal status: IN  PROGRESS  3.  Pt will be able to go 2-3 hours in between voids without urgency or incontinence in order to improve QOL and perform all functional activities with less difficulty.   Baseline:  Goal status: IN  PROGRESS  4.  Pt will report no episodes of urinary incontinence in order to improve confidence in community activities and personal hygiene.   Baseline:  Goal status: IN PROGRESS  5.  Pt will report no greater than 2/10 low back pain with any activity, especially ability to pick up baby, bend forward, bed mob/ility, and prolonged sitting.  Baseline:  Goal status: IN PROGRESS  6.  Pt will improve PFIQ score by 50% or more.  Baseline: 90 Goal status: IN PROGRESS  PLAN:  PT FREQUENCY: 1x/week  PT DURATION: 12 weeks  PLANNED INTERVENTIONS: Therapeutic exercises, Therapeutic activity, Neuromuscular re-education, Balance training, Gait training, Patient/Family education, Self Care, Joint mobilization, Dry Needling, Biofeedback, and Manual therapy  PLAN FOR NEXT SESSION: pressure management with exhale and progress to stnading and funcitonal postures as able,  release lumbar spasms  Gustavus Bryant, PT, DPT 04/02/22 10:23 AM

## 2022-04-09 ENCOUNTER — Ambulatory Visit: Payer: Medicaid Other | Admitting: Physical Therapy

## 2022-04-09 DIAGNOSIS — R279 Unspecified lack of coordination: Secondary | ICD-10-CM | POA: Diagnosis not present

## 2022-04-09 DIAGNOSIS — M62838 Other muscle spasm: Secondary | ICD-10-CM | POA: Diagnosis not present

## 2022-04-09 DIAGNOSIS — R293 Abnormal posture: Secondary | ICD-10-CM | POA: Diagnosis not present

## 2022-04-09 DIAGNOSIS — M6281 Muscle weakness (generalized): Secondary | ICD-10-CM

## 2022-04-09 NOTE — Therapy (Signed)
OUTPATIENT PHYSICAL THERAPY FEMALE PELVIC TREATMENT   Patient Name: Heather Savage MRN: [id number] DOB:[birth year], 32 y.o., female Today's Date: 04/09/2022   PT End of Session - 04/09/22 1152     Visit Number 6    Date for PT Re-Evaluation 05/01/22    Authorization Type healthy blue    PT Start Time 1147    PT Stop Time 1227    PT Time Calculation (min) 40 min    Activity Tolerance Patient tolerated treatment well    Behavior During Therapy WFL for tasks assessed/performed                   Past Medical History:  Diagnosis Date   Anemia    states has low blood count, no current meds.   Hearing loss    right   History of gastritis    no current med.   History of gestational diabetes 07/16/2018   Normal postpartum GTT   History of malaria 2012   Preterm labor    Rh negative, maternal    Tympanic membrane perforation 05/2012   right   Past Surgical History:  Procedure Laterality Date   CHOLECYSTECTOMY N/A 09/27/2018   Procedure: LAPAROSCOPIC CHOLECYSTECTOMY WITH INTRAOPERATIVE CHOLANGIOGRAM;  Surgeon: Ileana Roup, MD;  Location: [facility];  Service: General;  Laterality: N/A;   ERCP N/A 09/28/2018   Procedure: ENDOSCOPIC RETROGRADE CHOLANGIOPANCREATOGRAPHY (ERCP);  Surgeon: Ladene Artist, MD;  Location: [facility];  Service: Endoscopy;  Laterality: N/A;   EXTERNAL EAR SURGERY     REMOVAL OF STONES  09/28/2018   Procedure: REMOVAL OF STONES;  Surgeon: Ladene Artist, MD;  Location: [facility];  Service: Endoscopy;;   SCAR REVISION  08/27/2011   Procedure: SCAR REVISION;  Surgeon: Cristine Polio, MD;  Location: [place];  Service: Plastics;  Laterality: N/A;  scar revision of forehead   SPHINCTEROTOMY  09/28/2018   Procedure: SPHINCTEROTOMY;  Surgeon: Ladene Artist, MD;  Location: [facility];  Service: Endoscopy;;   TYMPANOPLASTY  05/14/2011   Procedure: TYMPANOPLASTY;  Surgeon: Izora Gala, MD;  Location: [place];  Service: ENT;  Laterality: Left;  TYMPANOPLASYT AND  POSSIBLE OSSICULARPLASTY    TYMPANOPLASTY  10/10/2011   Procedure: TYMPANOPLASTY;  Surgeon: Izora Gala, MD;  Location: [place];  Service: ENT;  Laterality: Right;   TYMPANOPLASTY Right 06/30/2012   Procedure: REVISION RIGHT TYMPANOPLASTY;  Surgeon: Izora Gala, MD;  Location: [place];  Service: ENT;  Laterality: Right;   Patient Active Problem List   Diagnosis Date Noted   ASCUS of cervix with negative high risk HPV 11/12/2021   Encounter for induction of labor 09/23/2021   Gestational diabetes 07/18/2021   Supervision of normal pregnancy 03/10/2021   S/P laparoscopic cholecystectomy 09/27/2018   History of gestational diabetes 07/16/2018   Rh negative, maternal 02/08/2016   Language barrier 01/02/2016   Hearing loss 05/01/2011    PCP: None  REFERRING PROVIDER: Elvera Maria, CNM  REFERRING DIAG:  M62.89 (ICD-10-CM) - Pelvic floor dysfunction in female  M54.6 (ICD-10-CM) - Acute bilateral thoracic back pain      THERAPY DIAG:  Unspecified lack of coordination  Other muscle spasm  Abnormal posture  Muscle weakness (generalized)  Rationale for Evaluation and Treatment: Rehabilitation  ONSET DATE: 9 month ago  SUBJECTIVE:  SUBJECTIVE STATEMENT: Pt states she has been having no pain other than lying on the floor with certain exercises and it is not bad. The urine is more of a problem because I feel I cannot drink anything because I can't get to the bathroom.  Fluid intake: Yes: not assessed this session  interpreters   PAIN:  PAIN:  Are you having pain? No    PRECAUTIONS: None  WEIGHT BEARING RESTRICTIONS: No  FALLS:  Has patient fallen in last 6 months? No  LIVING ENVIRONMENT: Lives with: lives  with their family Lives in: House/apartment  OCCUPATION: does not work outside of the home  PLOF: Independent  PATIENT GOALS: to decrease pain  PERTINENT HISTORY:  G4P3 Sexual abuse: No  BOWEL MOVEMENT: Pain with bowel movement: No Type of bowel movement:Frequency 1x every 3 days  and Strain Yes (sometimes) Fully empty rectum: Yes: - Leakage: No Pads: No Fiber supplement: No  URINATION: Pain with urination: No Fully empty bladder: Yes: - Stream: Strong Urgency: Yes: cannot hold it Frequency: 1.5 hours Leakage: Urge to void, Walking to the bathroom, Coughing, Sneezing, and Laughing Pads: Yes: -  INTERCOURSE: Pain with intercourse:  has not returned to intercourse, but no history of pain   PREGNANCY: Vaginal deliveries 3 Tearing No C-section deliveries 0 Currently pregnant No  PROLAPSE: Report of vaginal bulging   OBJECTIVE:   02/06/22: PATIENT SURVEYS:   PFIQ-7 = 48 (04/09/22) down from 90 at eval  COGNITION: Overall cognitive status: Within functional limits for tasks assessed     SENSATION: Light touch: Appears intact Proprioception: Appears intact  MUSCLE LENGTH: decreased left hip flexor length, very limited bil piriformis/hamstrings  Lumbar special test - SLR and slump positive on Rt side FUNCTIONAL TESTS:  Single leg standing on Rt LE increased pain and trunk lean to the Rt  GAIT: Comments: WNL  POSTURE: rounded shoulders, forward head, increased lumbar lordosis, increased thoracic kyphosis, and anterior pelvic tilt   LUMBARAROM/PROM:  A/PROM A/PROM  Eval (%)  Flexion 75, pain  Extension 50, pain  Right lateral flexion 75  Left lateral flexion 50, pain  Right rotation 50  Left rotation 50, pain   (Blank rows = not tested)   LOWER EXTREMITY MMT: Rt hip flexion 4/5; Rt knee ext 4/5, Rt ankle DF 3/5 - all with pain; Lt hip 5/5   PALPATION:   General  tenderness throughout lumbar paraspinals bil                External Perineal  Exam NA                             Internal Pelvic Floor difficulty with knack, feels like  Patient confirms identification and approves PT to assess internal pelvic floor and treatment Yes will perform in future treatment sessions  PELVIC MMT:   MMT 03/20/22   Vaginal 2/5 2 reps; 1 sec hold  Internal Anal Sphincter   External Anal Sphincter   Puborectalis   Diastasis Recti   (Blank rows = not tested)        TONE: High tone in low back and gluteals  PROLAPSE: NA  TODAY'S TREATMENT:  DATE: 04/02/22                   NMRE Exercises:  Exhale with exertion and engaging core - standing  Mini squats Mini squat with green band rotation Side step Side lunge Lean on mat and kegel Exercises: Discussed progress and reviewed urge techniques     PATIENT EDUCATION:  Education details: education on condition and treatment Person educated: Patient Education method: Explanation, Demonstration, Tactile cues, Verbal cues, and Handouts Education comprehension: verbalized understanding  HOME EXERCISE PROGRAM: [email or web address] Access Code: [email or web address] URL: [email or web address] Date: 02/06/2022 Prepared by: Jari Favre  Exercises - Seated Pelvic Floor Contraction with Isometric Hip Adduction  - 3 x daily - 7 x weekly - 1 sets - 10 reps - Seated Pelvic Floor Contraction with Hip Abduction and Resistance Loop  - 3 x daily - 7 x weekly - 1 sets - 10 reps - Seated March  - 3 x daily - 7 x weekly - 3 sets - 10 reps - Seated Piriformis Stretch with Trunk Bend  - 3 x daily - 7 x weekly - 1 sets - 2 reps - 60 hold - Seated Hamstring Stretch  - 3 x daily - 7 x weekly - 1 sets - 2 reps - 60 hold - Seated Thoracic Extension with Hands Behind Neck  - 1 x daily - 7 x weekly - 3 sets - 10 reps Dry needling education ASSESSMENT:  CLINICAL IMPRESSION: Todays  session focused on pelvic floor strength in standing.  Pt had urgency after exercises and unable to stop had to go immediately.  Pt needed review of urgency techniques. Overall, much better as goals mention below and all updated today.  Pt will benefit from skilled PT to continue to improve functional activities without leakage  OBJECTIVE IMPAIRMENTS: decreased coordination, decreased endurance, decreased mobility, decreased ROM, decreased strength, impaired tone, postural dysfunction, and pain.   ACTIVITY LIMITATIONS: carrying, lifting, bending, sitting, standing, squatting, transfers, bed mobility, continence, and locomotion level  PARTICIPATION LIMITATIONS: community activity  PERSONAL FACTORS: 1 comorbidity: G4P3  are also affecting patient's functional outcome.   REHAB POTENTIAL: Good  CLINICAL DECISION MAKING: Stable/uncomplicated  EVALUATION COMPLEXITY: Low   GOALS: Goals reviewed with patient? Yes  SHORT TERM GOALS: Target date: 03/06/2022 Updated 03/14/22  Pt will be independent with HEP.   Baseline: Goal status: MET  2.  Pt will be independent with the knack, urge suppression technique, and double voiding in order to improve bladder habits and decrease urinary incontinence.   Baseline:  Goal status: MET  3.  Pt will be able to correctly perform diaphragmatic breathing and appropriate pressure management in order to prevent worsening vaginal wall laxity and improve pelvic floor A/ROM.   Baseline:  Goal status: MET    LONG TERM GOALS: Target date: 05/01/2022   Pt will be independent with advanced HEP.   Baseline:  Goal status: IN PROGRESS  2.  Pt will demonstrate normal pelvic floor muscle tone and A/ROM, able to achieve 3/5 strength with contractions and 10 sec endurance, in order to provide appropriate lumbopelvic support in functional activities.   Baseline:  Goal status: IN PROGRESS  3.  Pt will be able to go 2-3 hours in between voids without urgency or  incontinence in order to improve QOL and perform all functional activities with less difficulty.   Baseline: 3-4 hours Goal status: MET  4.  Pt will report no episodes of urinary incontinence in order to improve confidence in community  activities and personal hygiene.   Baseline: I don't drink water when I go out, at home I can get to the bathroom quickly - currently at home I change the underwear 1-2/day and some days none - better from 6-7x/day before coming Goal status: IN PROGRESS  5.  Pt will report no greater than 2/10 low back pain with any activity, especially ability to pick up baby, bend forward, bed mob/ility, and prolonged sitting.  Baseline: pain on the right just occasionally pulling feeling on the Rt side with exercise only on my back doing exercises Goal status: MET  6.  Pt will improve PFIQ score by 50% or more.  Baseline: 90 initial down to 48 for leakage Goal status: IN PROGRESS  PLAN:  PT FREQUENCY: 1x/week  PT DURATION: 12 weeks  PLANNED INTERVENTIONS: Therapeutic exercises, Therapeutic activity, Neuromuscular re-education, Balance training, Gait training, Patient/Family education, Self Care, Joint mobilization, Dry Needling, Biofeedback, and Manual therapy  PLAN FOR NEXT SESSION: pressure management with exhale and progress to stnading and funcitonal postures as able,  release lumbar spasms  Gustavus Bryant, PT, DPT 04/09/22 11:53 AM

## 2022-04-16 ENCOUNTER — Ambulatory Visit: Payer: Medicaid Other | Admitting: Physical Therapy

## 2022-04-23 ENCOUNTER — Ambulatory Visit: Payer: Medicaid Other | Admitting: Physical Therapy

## 2022-04-23 ENCOUNTER — Encounter: Payer: Self-pay | Admitting: Physical Therapy

## 2022-04-23 DIAGNOSIS — M62838 Other muscle spasm: Secondary | ICD-10-CM

## 2022-04-23 DIAGNOSIS — R279 Unspecified lack of coordination: Secondary | ICD-10-CM | POA: Diagnosis not present

## 2022-04-23 DIAGNOSIS — R293 Abnormal posture: Secondary | ICD-10-CM | POA: Diagnosis not present

## 2022-04-23 DIAGNOSIS — M6281 Muscle weakness (generalized): Secondary | ICD-10-CM | POA: Diagnosis not present

## 2022-04-23 NOTE — Therapy (Addendum)
OUTPATIENT PHYSICAL THERAPY FEMALE PELVIC TREATMENT   Patient Name: Heather Savage MRN: [id number] DOB:[birth year], 32 y.o., female Today's Date: 04/23/2022   PT End of Session - 04/23/22 1241     Visit Number 7    Date for PT Re-Evaluation 05/01/22    Authorization Type healthy blue 5 approved until 5/9    Authorization - Visit Number 2    Authorization - Number of Visits 5    PT Start Time 1100    PT Stop Time 1151    PT Time Calculation (min) 51 min    Activity Tolerance Patient tolerated treatment well    Behavior During Therapy WFL for tasks assessed/performed                    Past Medical History:  Diagnosis Date   Anemia    states has low blood count, no current meds.   Hearing loss    right   History of gastritis    no current med.   History of gestational diabetes 07/16/2018   Normal postpartum GTT   History of malaria 2012   Preterm labor    Rh negative, maternal    Tympanic membrane perforation 05/2012   right   Past Surgical History:  Procedure Laterality Date   CHOLECYSTECTOMY N/A 09/27/2018   Procedure: LAPAROSCOPIC CHOLECYSTECTOMY WITH INTRAOPERATIVE CHOLANGIOGRAM;  Surgeon: Ileana Roup, MD;  Location: [facility];  Service: General;  Laterality: N/A;   ERCP N/A 09/28/2018   Procedure: ENDOSCOPIC RETROGRADE CHOLANGIOPANCREATOGRAPHY (ERCP);  Surgeon: Ladene Artist, MD;  Location: [facility];  Service: Endoscopy;  Laterality: N/A;   EXTERNAL EAR SURGERY     REMOVAL OF STONES  09/28/2018   Procedure: REMOVAL OF STONES;  Surgeon: Ladene Artist, MD;  Location: [facility];  Service: Endoscopy;;   SCAR REVISION  08/27/2011   Procedure: SCAR REVISION;  Surgeon: Cristine Polio, MD;  Location: [place];  Service: Plastics;  Laterality: N/A;  scar revision of forehead   SPHINCTEROTOMY  09/28/2018   Procedure: SPHINCTEROTOMY;  Surgeon: Ladene Artist, MD;  Location: [facility];  Service: Endoscopy;;   TYMPANOPLASTY   05/14/2011   Procedure: TYMPANOPLASTY;  Surgeon: Izora Gala, MD;  Location: [place];  Service: ENT;  Laterality: Left;  TYMPANOPLASYT AND  POSSIBLE OSSICULARPLASTY    TYMPANOPLASTY  10/10/2011   Procedure: TYMPANOPLASTY;  Surgeon: Izora Gala, MD;  Location: [place];  Service: ENT;  Laterality: Right;   TYMPANOPLASTY Right 06/30/2012   Procedure: REVISION RIGHT TYMPANOPLASTY;  Surgeon: Izora Gala, MD;  Location: [place];  Service: ENT;  Laterality: Right;   Patient Active Problem List   Diagnosis Date Noted   ASCUS of cervix with negative high risk HPV 11/12/2021   Encounter for induction of labor 09/23/2021   Gestational diabetes 07/18/2021   Supervision of normal pregnancy 03/10/2021   S/P laparoscopic cholecystectomy 09/27/2018   History of gestational diabetes 07/16/2018   Rh negative, maternal 02/08/2016   Language barrier 01/02/2016   Hearing loss 05/01/2011    PCP: None  REFERRING PROVIDER: Elvera Maria, CNM  REFERRING DIAG:  M62.89 (ICD-10-CM) - Pelvic floor dysfunction in female  M54.6 (ICD-10-CM) - Acute bilateral thoracic back pain      THERAPY DIAG:  Unspecified lack of coordination  Other muscle spasm  Abnormal posture  Muscle weakness (generalized)  Rationale for Evaluation and Treatment: Rehabilitation  ONSET DATE: 9 month ago  SUBJECTIVE:  SUBJECTIVE STATEMENT: Pt states she is having pain when riding her bike.  Pt states pain feels up to 7/10 when riding stationary bike at home.  Pain and tight in back when doing exercises on the floor. Fluid intake: Yes: not assessed this session  interpreter on the phone   PAIN:  PAIN:  Are you having pain? No    PRECAUTIONS: None  WEIGHT BEARING RESTRICTIONS: No  FALLS:  Has patient  fallen in last 6 months? No  LIVING ENVIRONMENT: Lives with: lives with their family Lives in: House/apartment  OCCUPATION: does not work outside of the home  PLOF: Independent  PATIENT GOALS: to decrease pain  PERTINENT HISTORY:  G4P3 Sexual abuse: No  BOWEL MOVEMENT: Pain with bowel movement: No Type of bowel movement:Frequency 1x every 3 days  and Strain Yes (sometimes) Fully empty rectum: Yes: - Leakage: No Pads: No Fiber supplement: No  URINATION: Pain with urination: No Fully empty bladder: Yes: - Stream: Strong Urgency: Yes: cannot hold it Frequency: 1.5 hours Leakage: Urge to void, Walking to the bathroom, Coughing, Sneezing, and Laughing Pads: Yes: -  INTERCOURSE: Pain with intercourse:  has not returned to intercourse, but no history of pain   PREGNANCY: Vaginal deliveries 3 Tearing No C-section deliveries 0 Currently pregnant No  PROLAPSE: Report of vaginal bulging   OBJECTIVE:   02/06/22: PATIENT SURVEYS:   PFIQ-7 = 48 (04/09/22) down from 90 at eval  COGNITION: Overall cognitive status: Within functional limits for tasks assessed     SENSATION: Light touch: Appears intact Proprioception: Appears intact  MUSCLE LENGTH: decreased left hip flexor length, very limited bil piriformis/hamstrings  Lumbar special test - SLR and slump positive on Rt side FUNCTIONAL TESTS:  Single leg standing on Rt LE increased pain and trunk lean to the Rt  GAIT: Comments: WNL  POSTURE: rounded shoulders, forward head, increased lumbar lordosis, increased thoracic kyphosis, and anterior pelvic tilt   LUMBARAROM/PROM:  A/PROM A/PROM  Eval (%)  Flexion 75, pain  Extension 50, pain  Right lateral flexion 75  Left lateral flexion 50, pain  Right rotation 50  Left rotation 50, pain   (Blank rows = not tested)   LOWER EXTREMITY MMT: Rt hip flexion 4/5; Rt knee ext 4/5, Rt ankle DF 3/5 - all with pain; Lt hip 5/5   PALPATION:   General  tenderness  throughout lumbar paraspinals bil                External Perineal Exam NA                             Internal Pelvic Floor difficulty with knack, feels like  Patient confirms identification and approves PT to assess internal pelvic floor and treatment Yes will perform in future treatment sessions  PELVIC MMT:   MMT 03/20/22   Vaginal 2/5 2 reps; 1 sec hold  Internal Anal Sphincter   External Anal Sphincter   Puborectalis   Diastasis Recti   (Blank rows = not tested)        TONE: High tone in low back and gluteals  PROLAPSE: NA  TODAY'S TREATMENT:  DATE: 04/23/22                   NMRE Exercises:  Exhale with exertion and engaging core  Cat cow Side lying clam Side lying horizontal abduction with 4lb Single knee to chest Standing row Manual; Lumbar paraspinals soft tissue Trigger Point Dry-Needling  Treatment instructions: Expect mild to moderate muscle soreness. S/S of pneumothorax if dry needled over a lung field, and to seek immediate medical attention should they occur. Patient verbalized understanding of these instructions and education.  Patient Consent Given: Yes Education handout provided: Yes Muscles treated: lumbar multifidi Electrical stimulation performed: No Parameters: N/A Treatment response/outcome: increased soft tissue length      PATIENT EDUCATION:  Education details: education on condition and treatment Person educated: Patient Education method: Explanation, Demonstration, Corporate treasurer cues, Verbal cues, and Handouts Education comprehension: verbalized understanding  HOME EXERCISE PROGRAM: [email or web address] Access Code: [email or web address] URL: [email or web address] Date: 02/06/2022 Prepared by: Jari Favre  Exercises - Seated Pelvic Floor Contraction with Isometric Hip Adduction  - 3 x daily - 7 x weekly - 1 sets  - 10 reps - Seated Pelvic Floor Contraction with Hip Abduction and Resistance Loop  - 3 x daily - 7 x weekly - 1 sets - 10 reps - Seated March  - 3 x daily - 7 x weekly - 3 sets - 10 reps - Seated Piriformis Stretch with Trunk Bend  - 3 x daily - 7 x weekly - 1 sets - 2 reps - 60 hold - Seated Hamstring Stretch  - 3 x daily - 7 x weekly - 1 sets - 2 reps - 60 hold - Seated Thoracic Extension with Hands Behind Neck  - 1 x daily - 7 x weekly - 3 sets - 10 reps Dry needling education ASSESSMENT:  CLINICAL IMPRESSION: Pt is not having urgency or leakage currently. Still has back pain with lifting and floor exercises. Pt needed a lot of cues to avoid holding breath and to correctly engage core.  Pt still encouraged to practice exercises at home as she is able to do correctly but just needing more time and focus to do correctly. Pt will benefit from skilled PT for 1-2 more visits to improve core strength and coordination for function without pain.  OBJECTIVE IMPAIRMENTS: decreased coordination, decreased endurance, decreased mobility, decreased ROM, decreased strength, impaired tone, postural dysfunction, and pain.   ACTIVITY LIMITATIONS: carrying, lifting, bending, sitting, standing, squatting, transfers, bed mobility, continence, and locomotion level  PARTICIPATION LIMITATIONS: community activity  PERSONAL FACTORS: 1 comorbidity: G4P3  are also affecting patient's functional outcome.   REHAB POTENTIAL: Good  CLINICAL DECISION MAKING: Stable/uncomplicated  EVALUATION COMPLEXITY: Low   GOALS: Goals reviewed with patient? Yes  SHORT TERM GOALS: Target date: 03/06/2022 Updated 03/14/22  Pt will be independent with HEP.   Baseline: Goal status: MET  2.  Pt will be independent with the knack, urge suppression technique, and double voiding in order to improve bladder habits and decrease urinary incontinence.   Baseline:  Goal status: MET  3.  Pt will be able to correctly perform  diaphragmatic breathing and appropriate pressure management in order to prevent worsening vaginal wall laxity and improve pelvic floor A/ROM.   Baseline:  Goal status: MET    LONG TERM GOALS: Target date: 05/01/2022   Pt will be independent with advanced HEP.   Baseline:  Goal status: IN PROGRESS  2.  Pt will demonstrate normal pelvic floor muscle tone and A/ROM, able to achieve  3/5 strength with contractions and 10 sec endurance, in order to provide appropriate lumbopelvic support in functional activities.   Baseline: holding breath Goal status: IN PROGRESS  3.  Pt will be able to go 2-3 hours in between voids without urgency or incontinence in order to improve QOL and perform all functional activities with less difficulty.   Baseline: 3-4 hours Goal status: MET  4.  Pt will report no episodes of urinary incontinence in order to improve confidence in community activities and personal hygiene.   Baseline: I don't drink water when I go out, at home I can get to the bathroom quickly - currently at home I change the underwear 1-2/day and some days none - better from 6-7x/day before coming Goal status: MET  5.  Pt will report no greater than 2/10 low back pain with any activity, especially ability to pick up baby, bend forward, bed mob/ility, and prolonged sitting.  Baseline:  Goal status: MET  6.  Pt will improve PFIQ score by 50% or more.  Baseline: 90 initial down to 48 for leakage Goal status: IN PROGRESS  PLAN:  PT FREQUENCY: 1x/week  PT DURATION: 12 weeks  PLANNED INTERVENTIONS: Therapeutic exercises, Therapeutic activity, Neuromuscular re-education, Balance training, Gait training, Patient/Family education, Self Care, Joint mobilization, Dry Needling, Biofeedback, and Manual therapy  PLAN FOR NEXT SESSION: pressure management with exhale and progress to stnading and funcitonal postures as able,  release lumbar spasms  Russella Dar, PT, DPT 04/23/22 1:49  PM  PHYSICAL THERAPY DISCHARGE SUMMARY  Visits from Start of Care: 7  Current functional level related to goals / functional outcomes: See above goals   Remaining deficits: See above   Education / Equipment: HEP   Patient agrees to discharge. Patient goals were partially met. Patient is being discharged due to not returning since the last visit.  Russella Dar, PT, DPT 07/16/22 10:19 AM

## 2022-10-21 ENCOUNTER — Ambulatory Visit (HOSPITAL_COMMUNITY)
Admission: EM | Admit: 2022-10-21 | Discharge: 2022-10-21 | Disposition: A | Discharge status: Home / Self Care | Payer: Medicaid Other | Attending: Emergency Medicine | Admitting: Emergency Medicine

## 2022-10-21 ENCOUNTER — Encounter (HOSPITAL_COMMUNITY): Payer: Self-pay

## 2022-10-21 DIAGNOSIS — N3001 Acute cystitis with hematuria: Secondary | ICD-10-CM | POA: Diagnosis not present

## 2022-10-21 DIAGNOSIS — M545 Low back pain, unspecified: Secondary | ICD-10-CM | POA: Insufficient documentation

## 2022-10-21 LAB — POCT URINALYSIS DIP (MANUAL ENTRY)
Bilirubin, UA: NEGATIVE
Glucose, UA: NEGATIVE mg/dL
Ketones, POC UA: NEGATIVE mg/dL — AB
Nitrite, UA: POSITIVE — AB
Protein Ur, POC: NEGATIVE mg/dL
Spec Grav, UA: >=1.03 — AB (ref 1.010–1.025)
Urobilinogen, UA: 0.2 E.U./dL
pH, UA: 5.5 (ref 5.0–8.0)

## 2022-10-21 LAB — POCT URINE PREGNANCY: Preg Test, Ur: NEGATIVE

## 2022-10-21 MED ORDER — CEPHALEXIN 500 MG PO CAPS: 500.0000 mg | ORAL_CAPSULE | Freq: Two times a day (BID) | ORAL | 0 refills | Status: AC

## 2022-10-21 MED ORDER — ACETAMINOPHEN 325 MG PO TABS: 650.0000 mg | ORAL_TABLET | Freq: Four times a day (QID) | ORAL | 1 refills | Status: DC | PRN

## 2022-10-21 MED ORDER — IBUPROFEN 600 MG PO TABS
600.0000 mg | ORAL_TABLET | Freq: Four times a day (QID) | ORAL | 0 refills | Status: DC | PRN
Start: 2022-10-21 — End: 2022-11-22

## 2022-10-21 NOTE — ED Triage Notes (Signed)
Patient speaks Amharic/audio interpreter  Patient c/o dysuria, loer abdominal pain, and bilateral lower back pain x 1 week.

## 2022-10-21 NOTE — Discharge Instructions (Addendum)
Take the Keflex antibiotic twice daily for 5 days. Take with food to avoid upset stomach. Drink lots of water!! Tylenol or ibuprofen can be used every 6 hours for pain  ? 5 ??? ? Keflex ???????? ??? ??? ?? ????. ??? ????? ?????? ???? ?? ????? ?? ?? ??!! ???? ?? 6 ??? Tylenol ??? ibuprofen ???? ???? le 5 k'enati ye Keflex ?nit?bayot?kini bek'eni huleti g?z? yiwisedu. yehodi dirik'etini lemasiwegedi kemigibi gari yiwisedu. bizu wiha t'et'u!! lehimemi beye 6 se'atu Tylenol weyimi ibuprofen met'ek'emi yichalali

## 2022-10-21 NOTE — ED Provider Notes (Signed)
[facility]    CSN: [id number] Arrival date & time: 10/21/22  1151      History   Chief Complaint Chief Complaint  Patient presents with   Abdominal Pain   Dysuria   Back Pain    HPI Heather Savage is a 32 y.o. female.  Medical interpreter used for encounter About 1 week history of dysuria, suprapubic pain, low back pain She has history of UTI and this feels similar Denies nausea or vomiting. No fever/flank pain She is tolerating fluids No medications taken yet She is breast-feeding  LMP 2 weeks ago  Past Medical History:  Diagnosis Date   Anemia    states has low blood count, no current meds.   Hearing loss    right   History of gastritis    no current med.   History of gestational diabetes 07/16/2018   Normal postpartum GTT   History of malaria 2012   Preterm labor    Rh negative, maternal    Tympanic membrane perforation 05/2012   right    Patient Active Problem List   Diagnosis Date Noted   ASCUS of cervix with negative high risk HPV 11/12/2021   Encounter for induction of labor 09/23/2021   Gestational diabetes 07/18/2021   Supervision of normal pregnancy 03/10/2021   S/P laparoscopic cholecystectomy 09/27/2018   History of gestational diabetes 07/16/2018   Rh negative, maternal 02/08/2016   Language barrier 01/02/2016   Hearing loss 05/01/2011    Past Surgical History:  Procedure Laterality Date   CHOLECYSTECTOMY N/A 09/27/2018   Procedure: LAPAROSCOPIC CHOLECYSTECTOMY WITH INTRAOPERATIVE CHOLANGIOGRAM;  Surgeon: Andria Meuse, MD;  Location: [facility];  Service: General;  Laterality: N/A;   ERCP N/A 09/28/2018   Procedure: ENDOSCOPIC RETROGRADE CHOLANGIOPANCREATOGRAPHY (ERCP);  Surgeon: Meryl Dare, MD;  Location: [facility];  Service: Endoscopy;  Laterality: N/A;   EXTERNAL EAR SURGERY     REMOVAL OF STONES  09/28/2018   Procedure: REMOVAL OF STONES;  Surgeon: Meryl Dare, MD;  Location: [facility];  Service:  Endoscopy;;   SCAR REVISION  08/27/2011   Procedure: SCAR REVISION;  Surgeon: Louisa Second, MD;  Location: [facility];  Service: Plastics;  Laterality: N/A;  scar revision of forehead   SPHINCTEROTOMY  09/28/2018   Procedure: SPHINCTEROTOMY;  Surgeon: Meryl Dare, MD;  Location: [facility];  Service: Endoscopy;;   TYMPANOPLASTY  05/14/2011   Procedure: TYMPANOPLASTY;  Surgeon: Serena Colonel, MD;  Location: [facility];  Service: ENT;  Laterality: Left;  TYMPANOPLASYT AND  POSSIBLE OSSICULARPLASTY    TYMPANOPLASTY  10/10/2011   Procedure: TYMPANOPLASTY;  Surgeon: Serena Colonel, MD;  Location: [facility];  Service: ENT;  Laterality: Right;   TYMPANOPLASTY Right 06/30/2012   Procedure: REVISION RIGHT TYMPANOPLASTY;  Surgeon: Serena Colonel, MD;  [facility];  Service: ENT;  Laterality: Right;    OB History     Gravida  4   Para  3   Term  3   Preterm  0   AB  1   Living  3      SAB  1   IAB  0   Ectopic  0   Multiple  0   Live Births  3            Home Medications    Prior to Admission medications   Medication Sig Start Date End Date Taking? Authorizing Provider  acetaminophen (TYLENOL) 325 MG tablet Take 2 tablets (  650 mg total) by mouth every 6 (six) hours as needed. 10/21/22  Yes Madine Sarr, Lurena Joiner, PA-C  cephALEXin (KEFLEX) 500 MG capsule Take 1 capsule (500 mg total) by mouth 2 (two) times daily for 5 days. 10/21/22 10/26/22 Yes Dietrick Barris, Lurena Joiner, PA-C  ibuprofen (ADVIL) 600 MG tablet Take 1 tablet (600 mg total) by mouth every 6 (six) hours as needed. 10/21/22  Yes Eleuterio Dollar, Lurena Joiner, PA-C    Family History Family History  Problem Relation Age of Onset   Asthma Neg Hx    Cancer Neg Hx    Birth defects Neg Hx    Diabetes Neg Hx    Heart disease Neg Hx    Hypertension Neg Hx    Stroke Neg Hx     Social History Social History   Tobacco Use   Smoking status: Never   Smokeless tobacco: Never  Vaping Use   Vaping  status: Never Used  Substance Use Topics   Alcohol use: Never   Drug use: Never     Allergies   Other and Skin adhesives [cyanoacrylate]   Review of Systems Review of Systems As per HPI  Physical Exam Triage Vital Signs ED Triage Vitals  Encounter Vitals Group     BP 10/21/22 1308 113/75     Systolic BP Percentile --      Diastolic BP Percentile --      Pulse Rate 10/21/22 1308 75     Resp 10/21/22 1308 16     Temp 10/21/22 1308 98.4 F (36.9 C)     Temp Source 10/21/22 1308 Oral     SpO2 10/21/22 1308 96 %     Weight --      Height --      Head Circumference --      Peak Flow --      Pain Score 10/21/22 1312 7     Pain Loc --      Pain Education --      Exclude from Growth Chart --    No data found.  Updated Vital Signs BP 113/75 (BP Location: Left Arm)   Pulse 75   Temp 98.4 F (36.9 C) (Oral)   Resp 16   LMP 10/07/2022   SpO2 96%   Breastfeeding Yes    Physical Exam Vitals and nursing note reviewed.  Constitutional:      General: She is not in acute distress. HENT:     Mouth/Throat:     Mouth: Mucous membranes are moist.     Pharynx: Oropharynx is clear.  Eyes:     Conjunctiva/sclera: Conjunctivae normal.     Pupils: Pupils are equal, round, and reactive to light.  Cardiovascular:     Rate and Rhythm: Normal rate and regular rhythm.     Heart sounds: Normal heart sounds.  Pulmonary:     Effort: Pulmonary effort is normal.     Breath sounds: Normal breath sounds.  Abdominal:     General: Bowel sounds are normal.     Palpations: Abdomen is soft.     Tenderness: There is abdominal tenderness in the suprapubic area. There is no right CVA tenderness, left CVA tenderness or guarding.  Musculoskeletal:     Comments: Low back muscular tenderness bilaterally. No bony tenderness  Neurological:     Mental Status: She is alert and oriented to person, place, and time.    UC Treatments / Results  Labs (all labs ordered are listed, but only abnormal  results are displayed) Labs Reviewed  POCT URINALYSIS DIP (MANUAL ENTRY) - Abnormal; Notable for the following components:      Result Value   Clarity, UA cloudy (*)    Ketones, POC UA negative (*)    Spec Grav, UA >=1.030 (*)    Blood, UA moderate (*)    Nitrite, UA Positive (*)    Leukocytes, UA Trace (*)    All other components within normal limits  URINE CULTURE  POCT URINE PREGNANCY    EKG  Radiology No results found.  Procedures Procedures   Medications Ordered in UC Medications - No data to display  Initial Impression / Assessment and Plan / UC Course  I have reviewed the triage vital signs and the nursing notes.  Pertinent labs & imaging results that were available during my care of the patient were reviewed by me and considered in my medical decision making (see chart for details).  UPT negative UA trace leuks, +nitrates, moderate RBC, elevated spec grav Culture pending. Treat for acute cystitis with keflex BID x 5 days. Discussed safe with breastfeeding. Increase fluids as much as tolerated.  Also recommend using ibuprofen and/or Tylenol for back pain.  Low concern for kidney involvement at this time, back pain is low and suspect muscular Return precautions discussed. Patient agrees to plan, all questions answered   Final Clinical Impressions(s) / UC Diagnoses   Final diagnoses:  Acute cystitis with hematuria  Acute bilateral low back pain without sciatica     Discharge Instructions      Take the Keflex antibiotic twice daily for 5 days. Take with food to avoid upset stomach. Drink lots of water!! Tylenol or ibuprofen can be used every 6 hours for pain  ? 5 ??? ? Keflex ???????? ??? ??? ?? ????. ??? ????? ?????? ???? ?? ????? ?? ?? ??!! ???? ?? 6 ??? Tylenol ??? ibuprofen ???? ???? le 5 k'enati ye Keflex ?nit?bayot?kini bek'eni huleti g?z? yiwisedu. yehodi dirik'etini lemasiwegedi kemigibi gari yiwisedu. bizu wiha t'et'u!! lehimemi beye 6 se'atu  Tylenol weyimi ibuprofen met'ek'emi yichalali    ED Prescriptions     Medication Sig Dispense Auth. Provider   cephALEXin (KEFLEX) 500 MG capsule Take 1 capsule (500 mg total) by mouth 2 (two) times daily for 5 days. 10 capsule Shadaya Marschner, PA-C   acetaminophen (TYLENOL) 325 MG tablet Take 2 tablets (650 mg total) by mouth every 6 (six) hours as needed. 30 tablet Cindi Ghazarian, PA-C   ibuprofen (ADVIL) 600 MG tablet Take 1 tablet (600 mg total) by mouth every 6 (six) hours as needed. 30 tablet Nicolo Tomko, Lurena Joiner, PA-C      PDMP not reviewed this encounter.   Eknoor Novack, Lurena Joiner, PA-C 10/21/22 1400

## 2022-10-23 LAB — URINE CULTURE: Culture: >=100000 — AB

## 2022-11-22 ENCOUNTER — Emergency Department (HOSPITAL_COMMUNITY)
Admission: EM | Admit: 2022-11-22 | Discharge: 2022-11-22 | Disposition: A | Discharge status: Home / Self Care | Payer: Medicaid Other | Attending: Emergency Medicine | Admitting: Emergency Medicine

## 2022-11-22 ENCOUNTER — Ambulatory Visit (HOSPITAL_COMMUNITY)
Admission: EM | Admit: 2022-11-22 | Discharge: 2022-11-22 | Disposition: A | Discharge status: Home / Self Care | Payer: Medicaid Other | Attending: Internal Medicine | Admitting: Internal Medicine

## 2022-11-22 ENCOUNTER — Encounter (HOSPITAL_COMMUNITY): Payer: Self-pay

## 2022-11-22 ENCOUNTER — Other Ambulatory Visit: Payer: Self-pay

## 2022-11-22 DIAGNOSIS — J029 Acute pharyngitis, unspecified: Secondary | ICD-10-CM

## 2022-11-22 DIAGNOSIS — J020 Streptococcal pharyngitis: Secondary | ICD-10-CM | POA: Diagnosis not present

## 2022-11-22 DIAGNOSIS — R319 Hematuria, unspecified: Secondary | ICD-10-CM

## 2022-11-22 DIAGNOSIS — Z1152 Encounter for screening for COVID-19: Secondary | ICD-10-CM | POA: Insufficient documentation

## 2022-11-22 DIAGNOSIS — N390 Urinary tract infection, site not specified: Secondary | ICD-10-CM

## 2022-11-22 LAB — POCT URINE PREGNANCY: Preg Test, Ur: NEGATIVE

## 2022-11-22 LAB — POCT URINALYSIS DIP (MANUAL ENTRY)
Glucose, UA: 100 mg/dL — AB
Leukocytes, UA: NEGATIVE
Nitrite, UA: NEGATIVE
Protein Ur, POC: 100 mg/dL — AB
Spec Grav, UA: 1.025 (ref 1.010–1.025)
Urobilinogen, UA: 1 U/dL
pH, UA: 7 (ref 5.0–8.0)

## 2022-11-22 LAB — RESP PANEL BY RT-PCR (RSV, FLU A&B, COVID)  RVPGX2
Influenza A by PCR: NEGATIVE
Influenza B by PCR: NEGATIVE
Resp Syncytial Virus by PCR: NEGATIVE
SARS Coronavirus 2 by RT PCR: NEGATIVE

## 2022-11-22 LAB — POCT RAPID STREP A (OFFICE): Rapid Strep A Screen: NEGATIVE

## 2022-11-22 LAB — GROUP A STREP BY PCR: Group A Strep by PCR: DETECTED — AB

## 2022-11-22 MED ORDER — PENICILLIN G BENZATHINE 1200000 UNIT/2ML IM SUSY
1.2000 10*6.[IU] | PREFILLED_SYRINGE | Freq: Once | INTRAMUSCULAR | Status: AC
  Administered 2022-11-22: 1.2 10*6.[IU] via INTRAMUSCULAR
  Filled 2022-11-22: qty 2

## 2022-11-22 MED ORDER — CEPHALEXIN 500 MG PO CAPS: 500.0000 mg | ORAL_CAPSULE | Freq: Three times a day (TID) | ORAL | 0 refills | Status: AC

## 2022-11-22 NOTE — ED Triage Notes (Signed)
Two days ago pt began having sore throat, malaise and gen body aches.Denies cough, Denies SOB or CP.

## 2022-11-22 NOTE — ED Provider Notes (Addendum)
[facility]    CSN: [id number] Arrival date & time: 11/22/22  6295      History   Chief Complaint Chief Complaint  Patient presents with   Sore Throat    HPI Heather Savage is a 32 y.o. female.   The history is provided by the patient and a relative. The history is limited by a language barrier.  Sore Throat Associated symptoms include headaches.  Sore throat since yesterday associated with subjective fever, body aches, dysuria. Admits headache. Denies frequency, urgency, hematuria, vaginal discharge, rhinorrhea, nasal, cough.  Denies household contacts with illness. Chart review shows patient diagnosed with urinary tract infection 10/21/2022 grew E. coli sensitive to all antibiotics tested She is currently breast-feeding  Past Medical History:  Diagnosis Date   Anemia    states has low blood count, no current meds.   Hearing loss    right   History of gastritis    no current med.   History of gestational diabetes 07/16/2018   Normal postpartum GTT   History of malaria 2012   Preterm labor    Rh negative, maternal    Tympanic membrane perforation 05/2012   right    Patient Active Problem List   Diagnosis Date Noted   ASCUS of cervix with negative high risk HPV 11/12/2021   Encounter for induction of labor 09/23/2021   Gestational diabetes 07/18/2021   Supervision of normal pregnancy 03/10/2021   S/P laparoscopic cholecystectomy 09/27/2018   History of gestational diabetes 07/16/2018   Rh negative, maternal 02/08/2016   Language barrier 01/02/2016   Hearing loss 05/01/2011    Past Surgical History:  Procedure Laterality Date   CHOLECYSTECTOMY N/A 09/27/2018   Procedure: LAPAROSCOPIC CHOLECYSTECTOMY WITH INTRAOPERATIVE CHOLANGIOGRAM;  Surgeon: Andria Meuse, MD;  Location: [facility];  Service: General;  Laterality: N/A;   ERCP N/A 09/28/2018   Procedure: ENDOSCOPIC RETROGRADE CHOLANGIOPANCREATOGRAPHY (ERCP);  Surgeon: Meryl Dare, MD;   Location: [facility];  Service: Endoscopy;  Laterality: N/A;   EXTERNAL EAR SURGERY     REMOVAL OF STONES  09/28/2018   Procedure: REMOVAL OF STONES;  Surgeon: Meryl Dare, MD;  Location: [facility];  Service: Endoscopy;;   SCAR REVISION  08/27/2011   Procedure: SCAR REVISION;  Surgeon: Louisa Second, MD;  Location: [facility];  Service: Plastics;  Laterality: N/A;  scar revision of forehead   SPHINCTEROTOMY  09/28/2018   Procedure: SPHINCTEROTOMY;  Surgeon: Meryl Dare, MD;  Location: [facility];  Service: Endoscopy;;   TYMPANOPLASTY  05/14/2011   Procedure: TYMPANOPLASTY;  Surgeon: Serena Colonel, MD;  Location: [facility];  Service: ENT;  Laterality: Left;  TYMPANOPLASYT AND  POSSIBLE OSSICULARPLASTY    TYMPANOPLASTY  10/10/2011   Procedure: TYMPANOPLASTY;  Surgeon: Serena Colonel, MD;  Location: [facility];  Service: ENT;  Laterality: Right;   TYMPANOPLASTY Right 06/30/2012   Procedure: REVISION RIGHT TYMPANOPLASTY;  Surgeon: Serena Colonel, MD;  Location: [facility];  Service: ENT;  Laterality: Right;    OB History     Gravida  4   Para  3   Term  3   Preterm  0   AB  1   Living  3      SAB  1   IAB  0   Ectopic  0   Multiple  0   Live Births  3            Home Medications    Prior to  Admission medications   Medication Sig Start Date End Date Taking? Authorizing Provider  acetaminophen (TYLENOL) 325 MG tablet Take 2 tablets (650 mg total) by mouth every 6 (six) hours as needed. 10/21/22   Rising, Lurena Joiner, PA-C  ibuprofen (ADVIL) 600 MG tablet Take 1 tablet (600 mg total) by mouth every 6 (six) hours as needed. 10/21/22   Rising, Lurena Joiner, PA-C    Family History Family History  Problem Relation Age of Onset   Asthma Neg Hx    Cancer Neg Hx    Birth defects Neg Hx    Diabetes Neg Hx    Heart disease Neg Hx    Hypertension Neg Hx    Stroke Neg Hx     Social History Social History   Tobacco Use   Smoking  status: Never   Smokeless tobacco: Never  Vaping Use   Vaping status: Never Used  Substance Use Topics   Alcohol use: Never   Drug use: Never     Allergies   Other and Skin adhesives [cyanoacrylate]   Review of Systems Review of Systems  Constitutional:  Positive for fatigue and fever. Negative for chills.  HENT:  Positive for sore throat. Negative for ear pain, postnasal drip, rhinorrhea and trouble swallowing.   Respiratory:  Negative for cough.   Genitourinary:  Positive for dysuria. Negative for decreased urine volume, flank pain, frequency, hematuria, menstrual problem and vaginal discharge.  Musculoskeletal:  Positive for myalgias.  Neurological:  Positive for headaches.     Physical Exam Triage Vital Signs ED Triage Vitals  Encounter Vitals Group     BP 11/22/22 1039 99/73     Systolic BP Percentile --      Diastolic BP Percentile --      Pulse Rate 11/22/22 1039 95     Resp 11/22/22 1039 18     Temp 11/22/22 1039 98.5 F (36.9 C)     Temp Source 11/22/22 1039 Oral     SpO2 11/22/22 1039 96 %     Weight 11/22/22 1039 162 lb 4.1 oz (73.6 kg)     Height 11/22/22 1039 5\' 3"  (1.6 m)     Head Circumference --      Peak Flow --      Pain Score 11/22/22 1038 10     Pain Loc --      Pain Education --      Exclude from Growth Chart --    No data found.  Updated Vital Signs BP 99/73 (BP Location: Left Arm)   Pulse 95   Temp 98.5 F (36.9 C) (Oral)   Resp 18   Ht 5\' 3"  (1.6 m)   Wt 162 lb 4.1 oz (73.6 kg)   LMP  (LMP Unknown)   SpO2 96%   Breastfeeding Yes   BMI 28.74 kg/m   Visual Acuity Right Eye Distance:   Left Eye Distance:   Bilateral Distance:    Right Eye Near:   Left Eye Near:    Bilateral Near:     Physical Exam Vitals and nursing note reviewed.  Constitutional:      Appearance: She is not ill-appearing.  HENT:     Head: Normocephalic and atraumatic.     Right Ear: Tympanic membrane and ear canal normal.     Left Ear: Tympanic  membrane and ear canal normal.     Nose: No rhinorrhea.     Mouth/Throat:     Mouth: Mucous membranes are moist. No oral lesions.  Pharynx: Uvula midline. No pharyngeal swelling, oropharyngeal exudate, posterior oropharyngeal erythema or uvula swelling.     Tonsils: No tonsillar exudate.  Neck:     Comments: Tender right anterior cervical node, mobile no overlying skin changes Cardiovascular:     Rate and Rhythm: Normal rate and regular rhythm.  Pulmonary:     Effort: Pulmonary effort is normal.  Abdominal:     General: Bowel sounds are normal.     Palpations: Abdomen is soft.     Tenderness: There is no abdominal tenderness.     Comments: No CVA tenderness  Musculoskeletal:     Cervical back: Neck supple.  Neurological:     Mental Status: She is alert.      UC Treatments / Results  Labs (all labs ordered are listed, but only abnormal results are displayed) Labs Reviewed  POCT RAPID STREP A (OFFICE)  POCT URINALYSIS DIP (MANUAL ENTRY)  POCT URINE PREGNANCY    EKG   Radiology No results found.  Procedures Procedures (including critical care time)  Medications Ordered in UC Medications - No data to display  Initial Impression / Assessment and Plan / UC Course  I have reviewed the triage vital signs and the nursing notes.  Pertinent labs & imaging results that were available during my care of the patient were reviewed by me and considered in my medical decision making (see chart for details).     32 year old with subjective fever and sore throat also having dysuria, had recent UTI.  Point-of-care urine is cloudy with some trace blood will treat for UTI.  Point-of-care strep is negative.  Pregnancy is negative.  Follow-up with PCP Monday if no improvement, ED precautions reviewed, OTC Tylenol for pain, increase fluid intake Final Clinical Impressions(s) / UC Diagnoses   Final diagnoses:  None   Discharge Instructions   None    ED Prescriptions   None     PDMP not reviewed this encounter.   Meliton Rattan, PA 11/22/22 1110    Meliton Rattan, [place] 11/22/22 [phone]

## 2022-11-22 NOTE — ED Provider Notes (Signed)
[place] EMERGENC[facility] DEPARTMENT AT [facility] Provider Note   CSN: [id number] Arrival date & time: 11/22/22  1128     History  Chief Complaint  Patient presents with   Sore Throat    Heather Savage is a 32 y.o. female.  With history of anemia, currently breast-feeding presenting to the ED for evaluation of sore throat.  Symptoms began approximately 3 days ago.  Was seen earlier today and had a negative strep test and was given information regarding supportive care.  She presents to the ED requesting additional testing.  She also reports some bodyaches and malaise.  No cough or shortness of breath.  No chest pain or difficulty swallowing.  She does report pain with swallowing.  No fevers or chills, nausea or vomiting.   Sore Throat       Home Medications Prior to Admission medications   Medication Sig Start Date End Date Taking? Authorizing Provider  cephALEXin (KEFLEX) 500 MG capsule Take 1 capsule (500 mg total) by mouth 3 (three) times daily for 5 days. 11/22/22 11/27/22  Meliton Rattan, PA      Allergies    Other and Skin adhesives [cyanoacrylate]    Review of Systems   Review of Systems  HENT:  Positive for sore throat.   All other systems reviewed and are negative.   Physical Exam Updated Vital Signs BP 113/81 (BP Location: Right Arm)   Pulse 94   Temp 98.5 F (36.9 C)   Resp 16   Ht 5\' 3"  (1.6 m)   Wt 73.6 kg   LMP  (LMP Unknown)   SpO2 100%   BMI 28.74 kg/m  Physical Exam Vitals and nursing note reviewed.  Constitutional:      General: She is not in acute distress.    Appearance: Normal appearance. She is normal weight. She is not ill-appearing.  HENT:     Head: Normocephalic and atraumatic.     Mouth/Throat:     Tonsils: 1+ on the right. 1+ on the left.     Comments: Tonsils 1+ bilaterally.  No exudates.  Mild posterior pharyngeal erythema.  No drooling, trismus or tripoding.  Mild anterior cervical lymphadenopathy.  No  submandibular erythema or induration. Pulmonary:     Effort: Pulmonary effort is normal. No respiratory distress.  Abdominal:     General: Abdomen is flat.  Musculoskeletal:        General: Normal range of motion.     Cervical back: Neck supple.  Skin:    General: Skin is warm and dry.  Neurological:     Mental Status: She is alert and oriented to person, place, and time.  Psychiatric:        Mood and Affect: Mood normal.        Behavior: Behavior normal.     ED Results / Procedures / Treatments   Labs (all labs ordered are listed, but only abnormal results are displayed) Labs Reviewed  GROUP A STREP BY PCR - Abnormal; Notable for the following components:      Result Value   Group A Strep by PCR DETECTED (*)    All other components within normal limits  RESP PANEL BY RT-PCR (RSV, FLU A&B, COVID)  RVPGX2    EKG None  Radiology No results found.  Procedures Procedures    Medications Ordered in ED Medications  penicillin g benzathine (BICILLIN LA) 1200000 UNIT/2ML injection 1.2 Million Units (1.2 Million Units Intramuscular Given 11/22/22 1432)    ED  Course/ Medical Decision Making/ A&P Clinical Course as of 11/22/22 1434  Thu Nov 22, 2022  1355 Patient not in room [DR]    Clinical Course User Index [DR] Margarita Grizzle, MD                                 Medical Decision Making Risk Prescription drug management.  This patient presents to the ED for concern of sore throat, this involves an extensive number of treatment options, and is a complaint that carries with it a high risk of complications and morbidity.  The differential diagnosis includes flu, COVID, RSV, mono, other viral URI, strep pharyngitis  My initial workup includes respiratory panel, strep  Additional history obtained from: Nursing notes from this visit.  I ordered, reviewed and interpreted labs which include: Strep positive  Afebrile, hemodynamically stable.  32 year old female  presenting for evaluation of sore throat, malaise, body aches.  Symptoms began 3 days ago.  She appears well on physical exam.  There is some mild posterior pharyngeal erythema.  No evidence of RPA or PTA.  Strep test here is positive.  I had a shared decision-making conversation with the patient regarding treatment.  She chooses IM Bicillin.  Will defer steroids due to current breast-feeding status.  She was encouraged to take Tylenol for pain at home.  She is also encouraged to drink warm tea with honey.  She was given return precautions.  Stable at discharge.  At this time there does not appear to be any evidence of an acute emergency medical condition and the patient appears stable for discharge with appropriate outpatient follow up. Diagnosis was discussed with patient who verbalizes understanding of care plan and is agreeable to discharge. I have discussed return precautions with patient who verbalizes understanding. Patient encouraged to follow-up with their PCP within 1 week. All questions answered.  Note: Portions of this report may have been transcribed using voice recognition software. Every effort was made to ensure accuracy; however, inadvertent computerized transcription errors may still be present.        Final Clinical Impression(s) / ED Diagnoses Final diagnoses:  Strep pharyngitis    Rx / DC Orders ED Discharge Orders     None         Michelle Piper, Cordelia Poche 11/22/22 1434    Margarita Grizzle, MD 11/22/22 [phone]

## 2022-11-22 NOTE — ED Triage Notes (Signed)
Sore Throat, dysuria onset yesterday. No known sick exposure.

## 2022-11-22 NOTE — Discharge Instructions (Signed)
You have been seen today for your complaint of sore throat. Your lab work was positive for strep throat. Your discharge medications include Tylenol.  Take up to 650 mg every 6 hours for pain. Home care instructions are as follows:  Drink warm tea with honey and use throat lozenges for your sore throat Please seek immediate medical care if you develop any of the following symptoms: You have new symptoms, such as vomiting, severe headache, stiff or painful neck, chest pain, or shortness of breath. You have severe throat pain, drooling, or changes in your voice. You have swelling of the neck, or the skin on the neck becomes red and tender. You have signs of dehydration, such as tiredness (fatigue), dry mouth, and decreased urination. You become increasingly sleepy, or you cannot wake up completely. Your joints become red or painful. At this time there does not appear to be the presence of an emergent medical condition, however there is always the potential for conditions to change. Please read and follow the below instructions.  Do not take your medicine if  develop an itchy rash, swelling in your mouth or lips, or difficulty breathing; call 911 and seek immediate emergency medical attention if this occurs.  You may review your lab tests and imaging results in their entirety on your MyChart account.  Please discuss all results of fully with your primary care provider and other specialist at your follow-up visit.  Note: Portions of this text may have been transcribed using voice recognition software. Every effort was made to ensure accuracy; however, inadvertent computerized transcription errors may still be present.

## 2022-11-22 NOTE — Discharge Instructions (Signed)
See your doctor in 4 days if no improvement

## 2023-03-19 DIAGNOSIS — R5382 Chronic fatigue, unspecified: Secondary | ICD-10-CM | POA: Diagnosis not present

## 2023-03-19 DIAGNOSIS — Z7689 Persons encountering health services in other specified circumstances: Secondary | ICD-10-CM | POA: Diagnosis not present

## 2023-03-19 DIAGNOSIS — H9193 Unspecified hearing loss, bilateral: Secondary | ICD-10-CM | POA: Diagnosis not present

## 2023-03-19 DIAGNOSIS — L659 Nonscarring hair loss, unspecified: Secondary | ICD-10-CM | POA: Diagnosis not present

## 2023-04-19 DIAGNOSIS — E119 Type 2 diabetes mellitus without complications: Secondary | ICD-10-CM | POA: Diagnosis not present

## 2023-04-19 DIAGNOSIS — Z758 Other problems related to medical facilities and other health care: Secondary | ICD-10-CM | POA: Diagnosis not present

## 2023-05-29 DIAGNOSIS — Z9889 Other specified postprocedural states: Secondary | ICD-10-CM | POA: Diagnosis not present

## 2023-05-29 DIAGNOSIS — H6993 Unspecified Eustachian tube disorder, bilateral: Secondary | ICD-10-CM | POA: Diagnosis not present

## 2023-05-29 DIAGNOSIS — H7293 Unspecified perforation of tympanic membrane, bilateral: Secondary | ICD-10-CM | POA: Diagnosis not present

## 2023-05-29 DIAGNOSIS — H90A32 Mixed conductive and sensorineural hearing loss, unilateral, left ear with restricted hearing on the contralateral side: Secondary | ICD-10-CM | POA: Diagnosis not present

## 2023-06-04 DIAGNOSIS — Z9889 Other specified postprocedural states: Secondary | ICD-10-CM | POA: Diagnosis not present

## 2023-06-04 DIAGNOSIS — H7293 Unspecified perforation of tympanic membrane, bilateral: Secondary | ICD-10-CM | POA: Diagnosis not present

## 2023-06-04 DIAGNOSIS — H90A32 Mixed conductive and sensorineural hearing loss, unilateral, left ear with restricted hearing on the contralateral side: Secondary | ICD-10-CM | POA: Diagnosis not present

## 2023-07-24 DIAGNOSIS — H900 Conductive hearing loss, bilateral: Secondary | ICD-10-CM | POA: Diagnosis not present

## 2023-07-24 DIAGNOSIS — H9212 Otorrhea, left ear: Secondary | ICD-10-CM | POA: Diagnosis not present

## 2023-08-15 ENCOUNTER — Emergency Department (HOSPITAL_COMMUNITY)
Admission: EM | Admit: 2023-08-15 | Discharge: 2023-08-16 | Disposition: A | Discharge status: Home / Self Care | Attending: Emergency Medicine | Admitting: Emergency Medicine

## 2023-08-15 DIAGNOSIS — B9689 Other specified bacterial agents as the cause of diseases classified elsewhere: Secondary | ICD-10-CM

## 2023-08-15 DIAGNOSIS — R300 Dysuria: Secondary | ICD-10-CM | POA: Diagnosis present

## 2023-08-15 DIAGNOSIS — N760 Acute vaginitis: Secondary | ICD-10-CM | POA: Insufficient documentation

## 2023-08-15 DIAGNOSIS — N390 Urinary tract infection, site not specified: Secondary | ICD-10-CM | POA: Diagnosis not present

## 2023-08-16 ENCOUNTER — Encounter (HOSPITAL_COMMUNITY): Payer: Self-pay

## 2023-08-16 ENCOUNTER — Other Ambulatory Visit: Payer: Self-pay

## 2023-08-16 LAB — PREGNANCY, URINE: Preg Test, Ur: NEGATIVE

## 2023-08-16 LAB — URINALYSIS, ROUTINE W REFLEX MICROSCOPIC
Bilirubin Urine: NEGATIVE
Glucose, UA: NEGATIVE mg/dL
Ketones, ur: NEGATIVE mg/dL
Nitrite: NEGATIVE
Protein, ur: NEGATIVE mg/dL
Specific Gravity, Urine: 1.004 — ABNORMAL LOW (ref 1.005–1.030)
pH: 6 (ref 5.0–8.0)

## 2023-08-16 LAB — WET PREP, GENITAL
Sperm: NONE SEEN
Trich, Wet Prep: NONE SEEN
WBC, Wet Prep HPF POC: >=10 — AB (ref <10)
Yeast Wet Prep HPF POC: NONE SEEN

## 2023-08-16 MED ORDER — METRONIDAZOLE 500 MG PO TABS: 500.0000 mg | ORAL_TABLET | Freq: Two times a day (BID) | ORAL | 0 refills | Status: AC

## 2023-08-16 MED ORDER — METRONIDAZOLE 500 MG PO TABS
500.0000 mg | ORAL_TABLET | Freq: Once | ORAL | Status: AC
  Administered 2023-08-16: 500 mg via ORAL
  Filled 2023-08-16: qty 1

## 2023-08-16 MED ORDER — CEPHALEXIN 500 MG PO CAPS: 500.0000 mg | ORAL_CAPSULE | Freq: Two times a day (BID) | ORAL | 0 refills | Status: AC

## 2023-08-16 MED ORDER — CEPHALEXIN 500 MG PO CAPS
500.0000 mg | ORAL_CAPSULE | Freq: Once | ORAL | Status: AC
  Administered 2023-08-16: 500 mg via ORAL
  Filled 2023-08-16: qty 1

## 2023-08-16 NOTE — ED Triage Notes (Signed)
 Pt states that she is having urinary sx that consist of dysuria, vaginal discharge,and burning x4 days. Endorses chills but denies n/v, or fever.

## 2023-08-16 NOTE — ED Notes (Signed)
 Wet prep, Self swabs sent

## 2023-08-16 NOTE — Discharge Instructions (Addendum)
?????? ????? ???????   Flagyl  ????? ???? ????? ??????? ?? ???? ????. ?? ???? ??? ??? ????? ?????.  ???? ??? ?????? ??????? Keflex  ????.  ????? ?????? ?????? ??? ?????? ?????? ????? ?????? ??? ?????? ?????? ???? ??? ?? ?? ?????? ????? lebakit?r?ya vaganos?si inidetazezewi Flagyl  yiwisedu. yihinini medihan?ti bem?wesidubeti g?z? ?likoli ?yit'et'u. t'uti iyat'ebu kehone yihini medihan?ti ?yiwisedu.  leshiniti bwanibwa ?nif?kishini inidetazezewi Keflex  yiwisedu.  inidegena lememerimeri yemejemer?ya dereja inikibikab? ?k'irab?woni yiketelu. yemejemer?ya dereja inikibikab? kel?liwoti, ibakiwo yekoni t'?na ina dehininetini yiketelu.   Take Flagyl  as prescribed for bacterial vaginosis. Do NOT drink alcohol while taking this medication. Do NOT take this medication if you are breastfeeding.  Take Keflex  as prescribed for urinary tract infection.  Follow up with your primary care provider for recheck. If you do not have a primary care, please follow up with [facility].

## 2023-08-16 NOTE — ED Provider Notes (Signed)
 [place] EMERGENCY DEPARTMENT AT [facility] Provider Note   CSN: [id number] Arrival date & time: 08/15/23  2312     Patient presents with: Dysuria   Heather Savage is a 33 y.o. female.   33 year old female presents with complaint of dysuria and vaginal itching onset yesterday.  Patient is sexually active in a monogamous relationship with her husband.  She denies abdominal pain, vomiting, fevers.  No other complaints or concerns today.       Prior to Admission medications   Medication Sig Start Date End Date Taking? Authorizing Provider  cephALEXin  (KEFLEX ) 500 MG capsule Take 1 capsule (500 mg total) by mouth 2 (two) times daily for 5 days. 08/16/23 08/21/23 Yes Beverley Leita LABOR, PA-C  metroNIDAZOLE  (FLAGYL ) 500 MG tablet Take 1 tablet (500 mg total) by mouth 2 (two) times daily. 08/16/23  Yes Beverley Leita LABOR, PA-C    Allergies: Other and Skin adhesives [cyanoacrylate]    Review of Systems Negative except as per HPI Updated Vital Signs BP 113/81 (BP Location: Left Arm)   Pulse 60   Temp 98 F (36.7 C) (Oral)   Resp 15   Ht 5' 3 (1.6 m)   Wt 74 kg   SpO2 100%   BMI 28.90 kg/m   Physical Exam Vitals and nursing note reviewed.  Constitutional:      General: She is not in acute distress.    Appearance: She is well-developed. She is not diaphoretic.  HENT:     Head: Normocephalic and atraumatic.  Pulmonary:     Effort: Pulmonary effort is normal.  Abdominal:     Palpations: Abdomen is soft.     Tenderness: There is no abdominal tenderness. There is no right CVA tenderness or left CVA tenderness.  Skin:    General: Skin is warm and dry.     Findings: No erythema or rash.  Neurological:     Mental Status: She is alert and oriented to person, place, and time.  Psychiatric:        Behavior: Behavior normal.     (all labs ordered are listed, but only abnormal results are displayed) Labs Reviewed  WET PREP, GENITAL - Abnormal; Notable for the  following components:      Result Value   Clue Cells Wet Prep HPF POC PRESENT (*)    WBC, Wet Prep HPF POC >=10 (*)    All other components within normal limits  URINALYSIS, ROUTINE W REFLEX MICROSCOPIC - Abnormal; Notable for the following components:   Color, Urine STRAW (*)    APPearance HAZY (*)    Specific Gravity, Urine 1.004 (*)    Hgb urine dipstick SMALL (*)    Leukocytes,Ua LARGE (*)    Bacteria, UA FEW (*)    All other components within normal limits  PREGNANCY, URINE    EKG: None  Radiology: No results found.   Procedures   Medications Ordered in the ED  cephALEXin  (KEFLEX ) capsule 500 mg (has no administration in time range)  metroNIDAZOLE  (FLAGYL ) tablet 500 mg (has no administration in time range)                                    Medical Decision Making Amount and/or Complexity of Data Reviewed Labs: ordered.  Risk Prescription drug management.   33 year old female presents with complaint of dysuria and vaginal itching.  Exam is unremarkable, she is afebrile, abdomen  soft nontender without CVA tenderness. Urinalysis suggest urinary tract infection.  Wet prep is positive for clue cells.  Patient be managed with Keflex  and Flagyl  for UTI and BV.  Interpreter was used for history, physical and discharge planning.  [facility]  She plans to follow-up with her primary care provider.     Final diagnoses:  BV (bacterial vaginosis)  Urinary tract infection in female    ED Discharge Orders          Ordered    cephALEXin  (KEFLEX ) 500 MG capsule  2 times daily        08/16/23 0240    metroNIDAZOLE  (FLAGYL ) 500 MG tablet  2 times daily        08/16/23 0240               Beverley Leita LABOR, PA-C 08/16/23 0242    Palumbo, April, MD 08/16/23 [phone]

## 2023-08-21 DIAGNOSIS — H9212 Otorrhea, left ear: Secondary | ICD-10-CM | POA: Diagnosis not present

## 2023-08-21 DIAGNOSIS — Z9009 Acquired absence of other part of head and neck: Secondary | ICD-10-CM | POA: Diagnosis not present

## 2023-08-22 DIAGNOSIS — Z131 Encounter for screening for diabetes mellitus: Secondary | ICD-10-CM | POA: Diagnosis not present

## 2023-08-22 DIAGNOSIS — Z0000 Encounter for general adult medical examination without abnormal findings: Secondary | ICD-10-CM | POA: Diagnosis not present

## 2023-08-22 DIAGNOSIS — Z13220 Encounter for screening for lipoid disorders: Secondary | ICD-10-CM | POA: Diagnosis not present

## 2023-09-27 NOTE — Telephone Encounter (Signed)
 Copied from CRM [id number]. Topic: Clinical Concerns - Medical Question >> Sep 27, 2023  4:21 PM [name] wrote: Savage, Heather is calling other request    Include all details related to the request(s) below:  Patient calling. Concerns of ongoing vaginal discomfort, X 3 days . Requesting call back from triage. Also states will need interpreter.     Confirm and type the Best Contact Number below:  Patient/caller contact number:  [phone]           [] Home  [x] Mobile  [] Work [] Other   [x] Okay to leave a voicemail   Medication List:  Current Outpatient Medications:  .  cephALEXin  (KEFLEX ) 500 mg capsule, Take 500 mg by mouth 2 (two) times a day., Disp: , Rfl:  .  clotrimazole (LOTRIMIN) 1 % soln, 4 drops in    left    ear twice a day for 7 days for fungal infection. (Patient not taking: Reported on 08/22/2023), Disp: 30 mL, Rfl: 0 .  ergocalciferol (VITAMIN D2) 1,250 mcg (50,000 unit) capsule, TAKE 1 CAPSULE BY MOUTH 1 TIME A WEEK (Patient not taking: Reported on 08/22/2023), Disp: 12 capsule, Rfl: 0 .  glucose blood test strip, Use as instructed (Patient not taking: Reported on 08/22/2023), Disp: 100 each, Rfl: 3 .  Lancets misc, Use as directed. (Patient not taking: Reported on 08/22/2023), Disp: 100 each, Rfl: 3 .  metroNIDAZOLE  (FLAGYL ) 500 mg tablet, Take 500 mg by mouth 2 (two) times a day., Disp: , Rfl:      Medication Request/Refills: Pharmacy Information (if applicable)   [] Not Applicable       []  Pharmacy listed  Send Medication Request to:                                                 [] Pharmacy not listed (added to pharmacy list in Epic) Send Medication Request to:      Listed Pharmacies: [id number] [place], [place] - [address] AT [facility] & [address] - PHONE: [phone] - FAX: [phone]

## 2023-09-29 ENCOUNTER — Other Ambulatory Visit: Payer: Self-pay

## 2023-09-29 ENCOUNTER — Encounter (HOSPITAL_COMMUNITY): Payer: Self-pay | Admitting: Emergency Medicine

## 2023-09-29 ENCOUNTER — Emergency Department (HOSPITAL_COMMUNITY)
Admission: EM | Admit: 2023-09-29 | Discharge: 2023-09-29 | Disposition: A | Discharge status: Home / Self Care | Attending: Emergency Medicine | Admitting: Emergency Medicine

## 2023-09-29 DIAGNOSIS — B3731 Acute candidiasis of vulva and vagina: Secondary | ICD-10-CM | POA: Diagnosis not present

## 2023-09-29 DIAGNOSIS — N3001 Acute cystitis with hematuria: Secondary | ICD-10-CM | POA: Diagnosis not present

## 2023-09-29 DIAGNOSIS — N764 Abscess of vulva: Secondary | ICD-10-CM | POA: Insufficient documentation

## 2023-09-29 DIAGNOSIS — N938 Other specified abnormal uterine and vaginal bleeding: Secondary | ICD-10-CM | POA: Diagnosis present

## 2023-09-29 DIAGNOSIS — L0291 Cutaneous abscess, unspecified: Secondary | ICD-10-CM

## 2023-09-29 LAB — CBC WITH DIFFERENTIAL/PLATELET
Abs Immature Granulocytes: 0.01 K/uL (ref 0.00–0.07)
Basophils Absolute: 0 K/uL (ref 0.0–0.1)
Basophils Relative: 1 %
Eosinophils Absolute: 0.1 K/uL (ref 0.0–0.5)
Eosinophils Relative: 1 %
HCT: 35 % — ABNORMAL LOW (ref 36.0–46.0)
Hemoglobin: 11.7 g/dL — ABNORMAL LOW (ref 12.0–15.0)
Immature Granulocytes: 0 %
Lymphocytes Relative: 38 %
Lymphs Abs: 2.4 K/uL (ref 0.7–4.0)
MCH: 30.2 pg (ref 26.0–34.0)
MCHC: 33.4 g/dL (ref 30.0–36.0)
MCV: 90.4 fL (ref 80.0–100.0)
Monocytes Absolute: 0.3 K/uL (ref 0.1–1.0)
Monocytes Relative: 5 %
Neutro Abs: 3.4 K/uL (ref 1.7–7.7)
Neutrophils Relative %: 55 %
Platelets: 265 K/uL (ref 150–400)
RBC: 3.87 MIL/uL (ref 3.87–5.11)
RDW: 12.6 % (ref 11.5–15.5)
WBC: 6.2 K/uL (ref 4.0–10.5)
nRBC: 0 % (ref 0.0–0.2)

## 2023-09-29 LAB — URINALYSIS, ROUTINE W REFLEX MICROSCOPIC
Bilirubin Urine: NEGATIVE
Glucose, UA: NEGATIVE mg/dL
Ketones, ur: NEGATIVE mg/dL
Nitrite: NEGATIVE
Protein, ur: NEGATIVE mg/dL
Specific Gravity, Urine: 1.005 (ref 1.005–1.030)
pH: 5 (ref 5.0–8.0)

## 2023-09-29 LAB — HCG, SERUM, QUALITATIVE: Preg, Serum: NEGATIVE

## 2023-09-29 LAB — RESP PANEL BY RT-PCR (RSV, FLU A&B, COVID)  RVPGX2
Influenza A by PCR: NEGATIVE
Influenza B by PCR: NEGATIVE
Resp Syncytial Virus by PCR: NEGATIVE
SARS Coronavirus 2 by RT PCR: NEGATIVE

## 2023-09-29 LAB — BASIC METABOLIC PANEL WITH GFR
Anion gap: 12 (ref 5–15)
BUN: 13 mg/dL (ref 6–20)
CO2: 20 mmol/L — ABNORMAL LOW (ref 22–32)
Calcium: 9.1 mg/dL (ref 8.9–10.3)
Chloride: 104 mmol/L (ref 98–111)
Creatinine, Ser: 0.51 mg/dL (ref 0.44–1.00)
GFR, Estimated: >60 mL/min (ref >60)
Glucose, Bld: 102 mg/dL — ABNORMAL HIGH (ref 70–99)
Potassium: 3.8 mmol/L (ref 3.5–5.1)
Sodium: 136 mmol/L (ref 135–145)

## 2023-09-29 LAB — WET PREP, GENITAL
Clue Cells Wet Prep HPF POC: NONE SEEN
Sperm: NONE SEEN
Trich, Wet Prep: NONE SEEN
WBC, Wet Prep HPF POC: >=10 — AB (ref <10)

## 2023-09-29 MED ORDER — METOCLOPRAMIDE HCL 5 MG/ML IJ SOLN
10.0000 mg | Freq: Once | INTRAMUSCULAR | Status: AC
  Administered 2023-09-29: 10 mg via INTRAVENOUS
  Filled 2023-09-29: qty 2

## 2023-09-29 MED ORDER — DOXYCYCLINE HYCLATE 100 MG PO TABS
100.0000 mg | ORAL_TABLET | Freq: Once | ORAL | Status: AC
Start: 2023-09-29 — End: 2023-09-29
  Administered 2023-09-29: 100 mg via ORAL
  Filled 2023-09-29: qty 1

## 2023-09-29 MED ORDER — HYDROCODONE-ACETAMINOPHEN 5-325 MG PO TABS: 1.0000 | ORAL_TABLET | ORAL | 0 refills | Status: AC | PRN

## 2023-09-29 MED ORDER — FENTANYL CITRATE PF 50 MCG/ML IJ SOSY
75.0000 ug | PREFILLED_SYRINGE | Freq: Once | INTRAMUSCULAR | Status: AC
  Administered 2023-09-29: 75 ug via INTRAVENOUS
  Filled 2023-09-29: qty 2

## 2023-09-29 MED ORDER — CEFTRIAXONE SODIUM 1 G IJ SOLR
500.0000 mg | Freq: Once | INTRAMUSCULAR | Status: AC
  Administered 2023-09-29: 500 mg via INTRAMUSCULAR
  Filled 2023-09-29: qty 10

## 2023-09-29 MED ORDER — LIDOCAINE-EPINEPHRINE (PF) 2 %-1:200000 IJ SOLN
10.0000 mL | Freq: Once | INTRAMUSCULAR | Status: AC
  Administered 2023-09-29: 10 mL via INTRADERMAL
  Filled 2023-09-29: qty 20

## 2023-09-29 MED ORDER — DOXYCYCLINE HYCLATE 100 MG PO TABS: 100.0000 mg | ORAL_TABLET | Freq: Two times a day (BID) | ORAL | 0 refills | Status: AC

## 2023-09-29 MED ORDER — FLUCONAZOLE 150 MG PO TABS
150.0000 mg | ORAL_TABLET | Freq: Once | ORAL | Status: AC
  Administered 2023-09-29: 150 mg via ORAL
  Filled 2023-09-29: qty 1

## 2023-09-29 MED ORDER — LIDOCAINE HCL (PF) 1 % IJ SOLN
1.0000 mL | Freq: Once | INTRAMUSCULAR | Status: AC
Start: 2023-09-29 — End: 2023-09-29
  Administered 2023-09-29: 1.2 mL
  Filled 2023-09-29: qty 30

## 2023-09-29 NOTE — ED Provider Notes (Signed)
 [facility] AT [facility] Provider Note   CSN: [id number] Arrival date & time: 09/29/23  9981     Patient presents with: Vaginal Bleeding and Headache   Heather Savage is a 33 y.o. female.  Patient with past medical history significant for ASCUS of cervix with negative high risk HPV, hearing loss, cholecystectomy history presents to the Emergency Department complaining of a bump on the left side of vagina which started 3 days ago.  Today it popped and started bleeding.  She reports nausea, chills, headache as well.  The headache has been ongoing for approximately 2 days.  She has taken no medications at home for symptom relief.  Other history of recent STI treatment.   She denies vomiting, other abdominal pain, chest pain, shortness of breath, fever. Amharic interpreter utilized throughout encounter    Vaginal Bleeding Headache      Prior to Admission medications   Medication Sig Start Date End Date Taking? Authorizing Provider  doxycycline  (VIBRA -TABS) 100 MG tablet Take 1 tablet (100 mg total) by mouth 2 (two) times daily. 09/29/23  Yes Logan Ubaldo NOVAK, PA-C  HYDROcodone -acetaminophen  (NORCO/VICODIN) 5-325 MG tablet Take 1 tablet by mouth every 4 (four) hours as needed. 09/29/23  Yes Logan Ubaldo NOVAK, PA-C  metroNIDAZOLE  (FLAGYL ) 500 MG tablet Take 1 tablet (500 mg total) by mouth 2 (two) times daily. 08/16/23   Beverley Leita LABOR, PA-C    Allergies: Other and Skin adhesives [cyanoacrylate]    Review of Systems  Genitourinary:  Positive for vaginal bleeding.  Neurological:  Positive for headaches.    Updated Vital Signs BP 138/85   Pulse 66   Temp 98.2 F (36.8 C)   Resp 16   Ht 5' 3 (1.6 m)   Wt 65.3 kg   SpO2 100%   Breastfeeding No   BMI 25.51 kg/m   Physical Exam Vitals and nursing note reviewed. Exam conducted with a chaperone present.  Constitutional:      General: She is not in acute distress.    Appearance: She is  well-developed.  HENT:     Head: Normocephalic and atraumatic.  Eyes:     Conjunctiva/sclera: Conjunctivae normal.  Cardiovascular:     Rate and Rhythm: Normal rate and regular rhythm.     Heart sounds: No murmur heard. Pulmonary:     Effort: Pulmonary effort is normal. No respiratory distress.     Breath sounds: Normal breath sounds.  Abdominal:     Palpations: Abdomen is soft.     Tenderness: There is no abdominal tenderness.  Genitourinary:    Labia:        Left: Tenderness present.      Vagina: Vaginal discharge present. No bleeding.   Musculoskeletal:        General: No swelling.     Cervical back: Neck supple.  Skin:    General: Skin is warm and dry.     Capillary Refill: Capillary refill takes less than 2 seconds.  Neurological:     Mental Status: She is alert.  Psychiatric:        Mood and Affect: Mood normal.     (all labs ordered are listed, but only abnormal results are displayed) Labs Reviewed  WET PREP, GENITAL - Abnormal; Notable for the following components:      Result Value   Yeast Wet Prep HPF POC PRESENT (*)    WBC, Wet Prep HPF POC >=10 (*)    All other components within normal  limits  CBC WITH DIFFERENTIAL/PLATELET - Abnormal; Notable for the following components:   Hemoglobin 11.7 (*)    HCT 35.0 (*)    All other components within normal limits  URINALYSIS, ROUTINE W REFLEX MICROSCOPIC - Abnormal; Notable for the following components:   Color, Urine STRAW (*)    Hgb urine dipstick MODERATE (*)    Leukocytes,Ua LARGE (*)    Bacteria, UA RARE (*)    All other components within normal limits  BASIC METABOLIC PANEL WITH GFR - Abnormal; Notable for the following components:   CO2 20 (*)    Glucose, Bld 102 (*)    All other components within normal limits  RESP PANEL BY RT-PCR (RSV, FLU A&B, COVID)  RVPGX2  HCG, SERUM, QUALITATIVE  GC/CHLAMYDIA PROBE AMP ([place]) NOT AT [facility]    EKG: None  Radiology: No results found.   .Incision  and Drainage  Date/Time: 09/29/2023 3:28 AM  Performed by: Logan Ubaldo NOVAK, PA-C Authorized by: Logan Ubaldo NOVAK, PA-C   Consent:    Consent obtained:  Verbal   Consent given by:  Patient   Risks, benefits, and alternatives were discussed: yes     Risks discussed:  Bleeding, incomplete drainage, pain, infection and damage to other organs Universal protocol:    Patient identity confirmed:  Arm band Location:    Type:  Abscess   Size:  2 x 2   Location:  Anogenital   Anogenital location: Labial abscess. Pre-procedure details:    Skin preparation:  Povidone-iodine Anesthesia:    Anesthesia method:  Local infiltration   Local anesthetic:  Lidocaine  2% WITH epi Procedure type:    Complexity:  Complex Procedure details:    Incision types:  Single straight   Wound management:  Irrigated with saline and probed and deloculated   Drainage:  Bloody and purulent   Drainage amount:  Moderate   Wound treatment:  Wound left open   Packing materials:  None Post-procedure details:    Procedure completion:  Tolerated    Medications Ordered in the ED  lidocaine -EPINEPHrine  (XYLOCAINE  W/EPI) 2 %-1:200000 (PF) injection 10 mL (10 mLs Intradermal Given 09/29/23 0336)  fentaNYL  (SUBLIMAZE ) injection 75 mcg (75 mcg Intravenous Given 09/29/23 0337)  cefTRIAXone  (ROCEPHIN ) injection 500 mg (500 mg Intramuscular Given 09/29/23 0424)  lidocaine  (PF) (XYLOCAINE ) 1 % injection 1-2.1 mL (1.2 mLs Other Given 09/29/23 0424)  doxycycline  (VIBRA -TABS) tablet 100 mg (100 mg Oral Given 09/29/23 0424)  fluconazole  (DIFLUCAN ) tablet 150 mg (150 mg Oral Given 09/29/23 0424)  metoCLOPramide  (REGLAN ) injection 10 mg (10 mg Intravenous Given 09/29/23 0424)                                    Medical Decision Making Amount and/or Complexity of Data Reviewed Labs: ordered.  Risk Prescription drug management.   This patient presents to the ED for concern of vaginal pain, this involves an extensive number of  treatment options, and is a complaint that carries with it a high risk of complications and morbidity.  The differential diagnosis includes labial abscess, PID, vaginitis, others.  Patient also complains of headache.  Differential includes tension headache, migraine, others   Co morbidities / Chronic conditions that complicate the patient evaluation  ASCUS   Additional history obtained:  Additional history obtained from EMR   Lab Tests:  I Ordered, and personally interpreted labs.  The pertinent results include: No leukocytosis, negative pregnancy  test, UA with large leukocytes, rare bacteria, 11-20 WBC, moderate hemoglobin; wet prep with WBC and yeast   Problem List / ED Course / Critical interventions / Medication management   I ordered medication including fentanyl , reglan , rocephin , fluconazole , doxycycline    Reevaluation of the patient after these medicines showed that the patient improved I have reviewed the patients home medicines and have made adjustments as needed   Social Determinants of Health:  Patient has medicaid for her primary health insurance type   Test / Admission - Considered:  Patient with labial abscess on exam.  Incision and drainage performed with moderate bloody and purulent drainage.  I explained to the patient that I would place her on antibiotics but that if the area did not improve she may need further incision and drainage.  She voiced understanding.  Patient also had significant vaginal discharge and endorsed history of sexually transmitted infection, recently treated about a month ago.  Based on history felt that it would be prudent to treat patient with Rocephin  and doxycycline  for the same.  The doxycycline  will also provide coverage for the abscess.  Wet prep showed signs of yeast and patient was treated with fluconazole .  Headache improved after Reglan  and fentanyl .  Headache with no red flag symptoms such as sudden onset, worst of life.  Labs  otherwise grossly unremarkable.  Patient appears stable at this time for discharge home.  I have recommended follow-up with her primary care along with follow-up with OB/GYN.  Patient voices understanding.      Final diagnoses:  Abscess  Acute cystitis with hematuria  Vaginal candidiasis    ED Discharge Orders          Ordered    doxycycline  (VIBRA -TABS) 100 MG tablet  2 times daily        09/29/23 0400    HYDROcodone -acetaminophen  (NORCO/VICODIN) 5-325 MG tablet  Every 4 hours PRN        09/29/23 0403               Logan Ubaldo NOVAK, PA-C 09/29/23 9561    Bari Charmaine FALCON, MD 09/29/23 [phone]

## 2023-09-29 NOTE — ED Triage Notes (Signed)
 33 y/o female comes in c/o something on her vagina that she noticed 3 days ago. Pt reports today,  it popped and started bleeding. PT reports nausea, chills and a headache. Pt denies taking anything at home to relieve her symptoms

## 2023-09-29 NOTE — ED Notes (Signed)
 ED Provider at bedside.

## 2023-09-29 NOTE — Discharge Instructions (Addendum)
 You were seen today for an abscess. I recommend following up with your primary care provider or OB/GYN for further evaluation as needed. If the area swells more or fails to improve you may come to the emergency department or an urgent care as you may need an additional incision and drainage procedure.  I prescribed a course of Norco, pain medicine. Take as directed. You may also take ibuprofen  or acetaminophen . Do not exceed 4000 mg of acetaminophen  from all sources.   Your workup also shows signs of a urinary tract infection. Please take the prescribed antibiotics until they are complete.  You were treated for a yeast infection this evening.   Results from STI testing will be available on mychart.  Return to the emergency department if you develop any life threatening symptoms.   ?? ????? ??? ????? ??? ??????? ???? ???? ??? ??????? ??? OB/GYN ?? ??????? ??????? ????? ?? ??? ??? ???? ???? ???? ????? ?? ???? ????? ??? ??????? ????? ?? ????? ??? ??? ????? ???? ??? ?????  ????? ???? ????? ??? ???? ??? ????? ????? ?????? ibuprofen  ??? acetaminophen  ???? ????. ???? ???? ? 4000 ?.?.   ????? ?? ???? ?? ?????? ??????? ????? ????? ?????? ???????? ????? ??? ?????  ?? ??? ???? ?????? ??????   ????? ??? ??? ????? ??? ?? ????? ??? ?????

## 2023-09-29 NOTE — ED Notes (Signed)
 Present nurse assisted Ubaldo High PA-c as chaperone during pelvic procedure.

## 2023-10-01 LAB — GC/CHLAMYDIA PROBE AMP (~~LOC~~) NOT AT ARMC
Chlamydia: NEGATIVE
Comment: NEGATIVE
Comment: NORMAL
Neisseria Gonorrhea: NEGATIVE

## 2023-10-02 DIAGNOSIS — N764 Abscess of vulva: Secondary | ICD-10-CM | POA: Diagnosis not present

## 2023-10-02 DIAGNOSIS — E119 Type 2 diabetes mellitus without complications: Secondary | ICD-10-CM | POA: Diagnosis not present
